# Patient Record
Sex: Female | Born: 1950 | Race: White | Hispanic: No | Marital: Married | Smoking: Never smoker
Health system: Southern US, Community
[De-identification: ages and names within clinical notes are randomized; demographics above are authoritative.]

## PROBLEM LIST (undated history)

## (undated) DIAGNOSIS — Z860100 Personal history of colon polyps, unspecified: Secondary | ICD-10-CM

## (undated) DIAGNOSIS — Z8709 Personal history of other diseases of the respiratory system: Secondary | ICD-10-CM

## (undated) DIAGNOSIS — Z8669 Personal history of other diseases of the nervous system and sense organs: Secondary | ICD-10-CM

## (undated) DIAGNOSIS — M549 Dorsalgia, unspecified: Secondary | ICD-10-CM

## (undated) DIAGNOSIS — R51 Headache: Secondary | ICD-10-CM

## (undated) DIAGNOSIS — E785 Hyperlipidemia, unspecified: Secondary | ICD-10-CM

## (undated) DIAGNOSIS — J302 Other seasonal allergic rhinitis: Secondary | ICD-10-CM

## (undated) DIAGNOSIS — Z87442 Personal history of urinary calculi: Secondary | ICD-10-CM

## (undated) DIAGNOSIS — R519 Headache, unspecified: Secondary | ICD-10-CM

## (undated) DIAGNOSIS — K5790 Diverticulosis of intestine, part unspecified, without perforation or abscess without bleeding: Secondary | ICD-10-CM

## (undated) DIAGNOSIS — J45909 Unspecified asthma, uncomplicated: Secondary | ICD-10-CM

## (undated) HISTORY — PX: NASAL SINUS SURGERY: SHX719

## (undated) HISTORY — PX: FOOT SURGERY: SHX648

## (undated) HISTORY — PX: COLONOSCOPY: SHX174

## (undated) HISTORY — PX: CYSTOSCOPY: SUR368

## (undated) HISTORY — PX: LITHOTRIPSY: SUR834

## (undated) HISTORY — PX: BACK SURGERY: SHX140

---

## 1983-10-25 HISTORY — PX: ABDOMINAL HYSTERECTOMY: SHX81

## 2005-11-14 ENCOUNTER — Ambulatory Visit: Payer: Self-pay | Admitting: Urology

## 2005-11-17 ENCOUNTER — Ambulatory Visit: Payer: Self-pay | Admitting: Urology

## 2006-03-24 ENCOUNTER — Ambulatory Visit: Payer: Self-pay

## 2007-05-15 ENCOUNTER — Ambulatory Visit: Payer: Self-pay

## 2007-05-18 ENCOUNTER — Ambulatory Visit: Payer: Self-pay

## 2007-10-25 HISTORY — PX: JOINT REPLACEMENT: SHX530

## 2008-07-01 ENCOUNTER — Ambulatory Visit: Payer: Self-pay | Admitting: Family Medicine

## 2008-08-05 ENCOUNTER — Ambulatory Visit: Payer: Self-pay | Admitting: Family Medicine

## 2008-08-12 ENCOUNTER — Ambulatory Visit: Payer: Self-pay | Admitting: General Practice

## 2008-08-26 ENCOUNTER — Inpatient Hospital Stay: Payer: Self-pay | Admitting: General Practice

## 2008-12-19 ENCOUNTER — Ambulatory Visit: Payer: Self-pay

## 2008-12-22 ENCOUNTER — Ambulatory Visit: Payer: Self-pay | Admitting: Pain Medicine

## 2009-01-12 ENCOUNTER — Ambulatory Visit: Payer: Self-pay | Admitting: Pain Medicine

## 2009-01-20 ENCOUNTER — Ambulatory Visit: Payer: Self-pay | Admitting: Pain Medicine

## 2009-02-04 ENCOUNTER — Ambulatory Visit: Payer: Self-pay | Admitting: Pain Medicine

## 2009-03-30 ENCOUNTER — Ambulatory Visit: Payer: Self-pay | Admitting: Pain Medicine

## 2009-03-31 ENCOUNTER — Ambulatory Visit: Payer: Self-pay | Admitting: Pain Medicine

## 2009-04-20 ENCOUNTER — Ambulatory Visit: Payer: Self-pay | Admitting: Pain Medicine

## 2009-04-21 ENCOUNTER — Ambulatory Visit: Payer: Self-pay | Admitting: Pain Medicine

## 2009-05-04 ENCOUNTER — Ambulatory Visit: Payer: Self-pay | Admitting: Physician Assistant

## 2009-09-14 ENCOUNTER — Ambulatory Visit: Payer: Self-pay | Admitting: Pain Medicine

## 2009-10-01 ENCOUNTER — Ambulatory Visit: Payer: Self-pay | Admitting: Physician Assistant

## 2009-10-02 DIAGNOSIS — M8580 Other specified disorders of bone density and structure, unspecified site: Secondary | ICD-10-CM | POA: Insufficient documentation

## 2009-10-02 DIAGNOSIS — IMO0002 Reserved for concepts with insufficient information to code with codable children: Secondary | ICD-10-CM | POA: Insufficient documentation

## 2009-10-06 ENCOUNTER — Ambulatory Visit: Payer: Self-pay | Admitting: Pain Medicine

## 2009-10-07 ENCOUNTER — Ambulatory Visit: Payer: Self-pay | Admitting: Family Medicine

## 2009-10-19 ENCOUNTER — Ambulatory Visit: Payer: Self-pay | Admitting: Family Medicine

## 2009-10-26 ENCOUNTER — Ambulatory Visit: Payer: Self-pay | Admitting: Pain Medicine

## 2010-01-11 DIAGNOSIS — M545 Low back pain, unspecified: Secondary | ICD-10-CM | POA: Insufficient documentation

## 2011-07-27 ENCOUNTER — Ambulatory Visit: Payer: Self-pay | Admitting: Family Medicine

## 2011-08-05 LAB — HM COLONOSCOPY

## 2012-09-03 LAB — HM HEPATITIS C SCREENING LAB: HM Hepatitis Screen: NEGATIVE

## 2012-09-18 ENCOUNTER — Ambulatory Visit: Payer: Self-pay | Admitting: Family Medicine

## 2013-03-14 ENCOUNTER — Ambulatory Visit: Payer: Self-pay | Admitting: Family Medicine

## 2013-04-18 ENCOUNTER — Ambulatory Visit: Payer: Self-pay | Admitting: Family Medicine

## 2013-06-05 ENCOUNTER — Ambulatory Visit: Payer: Self-pay | Admitting: Family Medicine

## 2013-06-15 ENCOUNTER — Emergency Department: Payer: Self-pay | Admitting: Emergency Medicine

## 2013-07-02 ENCOUNTER — Other Ambulatory Visit: Payer: Self-pay | Admitting: Neurosurgery

## 2013-07-09 ENCOUNTER — Encounter (HOSPITAL_COMMUNITY): Payer: Self-pay | Admitting: Pharmacy Technician

## 2013-07-10 ENCOUNTER — Encounter (HOSPITAL_COMMUNITY): Payer: Self-pay

## 2013-07-10 ENCOUNTER — Encounter (HOSPITAL_COMMUNITY)
Admission: RE | Admit: 2013-07-10 | Discharge: 2013-07-10 | Disposition: A | Discharge status: Home / Self Care | Payer: BC Managed Care – PPO | Source: Ambulatory Visit | Attending: Neurosurgery | Admitting: Neurosurgery

## 2013-07-10 DIAGNOSIS — Z01810 Encounter for preprocedural cardiovascular examination: Secondary | ICD-10-CM | POA: Insufficient documentation

## 2013-07-10 DIAGNOSIS — Z01818 Encounter for other preprocedural examination: Secondary | ICD-10-CM | POA: Insufficient documentation

## 2013-07-10 DIAGNOSIS — Z01812 Encounter for preprocedural laboratory examination: Secondary | ICD-10-CM | POA: Insufficient documentation

## 2013-07-10 HISTORY — DX: Diverticulosis of intestine, part unspecified, without perforation or abscess without bleeding: K57.90

## 2013-07-10 HISTORY — DX: Other seasonal allergic rhinitis: J30.2

## 2013-07-10 HISTORY — DX: Personal history of other diseases of the nervous system and sense organs: Z86.69

## 2013-07-10 HISTORY — DX: Hyperlipidemia, unspecified: E78.5

## 2013-07-10 HISTORY — DX: Dorsalgia, unspecified: M54.9

## 2013-07-10 HISTORY — DX: Personal history of colonic polyps: Z86.010

## 2013-07-10 HISTORY — DX: Unspecified asthma, uncomplicated: J45.909

## 2013-07-10 HISTORY — DX: Personal history of urinary calculi: Z87.442

## 2013-07-10 HISTORY — DX: Personal history of other diseases of the respiratory system: Z87.09

## 2013-07-10 HISTORY — DX: Personal history of colon polyps, unspecified: Z86.0100

## 2013-07-10 LAB — CBC
HCT: 40.1 % (ref 36.0–46.0)
Hemoglobin: 13.6 g/dL (ref 12.0–15.0)
MCH: 29.8 pg (ref 26.0–34.0)
MCHC: 33.9 g/dL (ref 30.0–36.0)
MCV: 87.9 fL (ref 78.0–100.0)
Platelets: 236 10*3/uL (ref 150–400)
RBC: 4.56 MIL/uL (ref 3.87–5.11)
RDW: 12.7 % (ref 11.5–15.5)
WBC: 6.4 10*3/uL (ref 4.0–10.5)

## 2013-07-10 LAB — SURGICAL PCR SCREEN
MRSA, PCR: NEGATIVE
Staphylococcus aureus: NEGATIVE

## 2013-07-10 LAB — TYPE AND SCREEN
ABO/RH(D): O POS
Antibody Screen: NEGATIVE

## 2013-07-10 LAB — BASIC METABOLIC PANEL
BUN: 13 mg/dL (ref 6–23)
CO2: 25 mEq/L (ref 19–32)
Calcium: 9.5 mg/dL (ref 8.4–10.5)
Chloride: 100 mEq/L (ref 96–112)
Creatinine, Ser: 0.88 mg/dL (ref 0.50–1.10)
GFR calc Af Amer: 81 mL/min — ABNORMAL LOW (ref >90)
GFR calc non Af Amer: 69 mL/min — ABNORMAL LOW (ref >90)
Glucose, Bld: 86 mg/dL (ref 70–99)
Potassium: 4.1 mEq/L (ref 3.5–5.1)
Sodium: 136 mEq/L (ref 135–145)

## 2013-07-10 LAB — ABO/RH: ABO/RH(D): O POS

## 2013-07-10 NOTE — Progress Notes (Signed)
Pt doesn't have a cardiologist  Denies ever having a heart cath/echo/stress test  Dr.Nancy Elease Hashimoto is Medical Md in [place]  3 CXR's have been done since May -to request report from Dr.Maloney  Denies EKG in past yr

## 2013-07-10 NOTE — Pre-Procedure Instructions (Signed)
LATARIA COURSER  07/10/2013   Your procedure is scheduled on:  Fri, Sept 19 @ 7:30 AM  Report to [facility] at 5:30 AM.  Call this number if you have problems the morning of surgery: [phone]   Remember:   Do not eat food or drink liquids after midnight.   Take these medicines the morning of surgery with A SIP OF WATER: Flonase(Fluticasone),Claritin(Loratadine),Dulera<Bring Your Inhaler With You>,and Pain Pill(if needed)               Stop taking your Ibuprofen.No Aspirin,Goody's,BC's,Aleve,Fish Oil,or any Herbal Medications   Do not wear jewelry, make-up or nail polish.  Do not wear lotions, powders, or perfumes. You may wear deodorant.  Do not shave 48 hours prior to surgery.   Do not bring valuables to the hospital.  [facility] is not responsible                   for any belongings or valuables.  Contacts, dentures or bridgework may not be worn into surgery.  Leave suitcase in the car. After surgery it may be brought to your room.  For patients admitted to the hospital, checkout time is 11:00 AM the day of  discharge.     Special Instructions: Shower using CHG 2 nights before surgery and the night before surgery.  If you shower the day of surgery use CHG.  Use special wash - you have one bottle of CHG for all showers.  You should use approximately 1/3 of the bottle for each shower.   Please read over the following fact sheets that you were given: Pain Booklet, Coughing and Deep Breathing, Blood Transfusion Information, MRSA Information and Surgical Site Infection Prevention

## 2013-07-11 MED ORDER — CEFAZOLIN SODIUM-DEXTROSE 2-3 GM-% IV SOLR
2.0000 g | INTRAVENOUS | Status: AC
  Administered 2013-07-12: 2 g via INTRAVENOUS
  Filled 2013-07-11: qty 50

## 2013-07-11 NOTE — Progress Notes (Signed)
Spoke with Dr Landis Gandy office [phone].  They state pt had CXR from August and they will send it now.

## 2013-07-12 ENCOUNTER — Inpatient Hospital Stay (HOSPITAL_COMMUNITY): Payer: BC Managed Care – PPO | Admitting: Anesthesiology

## 2013-07-12 ENCOUNTER — Encounter (HOSPITAL_COMMUNITY)
Admission: RE | Disposition: A | Discharge status: Home / Self Care | Payer: BC Managed Care – PPO | Source: Ambulatory Visit | Attending: Neurosurgery

## 2013-07-12 ENCOUNTER — Inpatient Hospital Stay (HOSPITAL_COMMUNITY)
Admission: RE | Admit: 2013-07-12 | Discharge: 2013-07-17 | DRG: 755 | Disposition: A | Discharge status: Home / Self Care | Payer: BC Managed Care – PPO | Source: Ambulatory Visit | Attending: Neurosurgery | Admitting: Neurosurgery

## 2013-07-12 ENCOUNTER — Encounter (HOSPITAL_COMMUNITY): Payer: Self-pay | Admitting: Anesthesiology

## 2013-07-12 ENCOUNTER — Encounter (HOSPITAL_COMMUNITY): Payer: Self-pay | Admitting: *Deleted

## 2013-07-12 ENCOUNTER — Inpatient Hospital Stay (HOSPITAL_COMMUNITY): Payer: BC Managed Care – PPO

## 2013-07-12 DIAGNOSIS — E7800 Pure hypercholesterolemia, unspecified: Secondary | ICD-10-CM | POA: Diagnosis present

## 2013-07-12 DIAGNOSIS — S32009A Unspecified fracture of unspecified lumbar vertebra, initial encounter for closed fracture: Principal | ICD-10-CM | POA: Diagnosis present

## 2013-07-12 DIAGNOSIS — S22001G Stable burst fracture of unspecified thoracic vertebra, subsequent encounter for fracture with delayed healing: Secondary | ICD-10-CM

## 2013-07-12 DIAGNOSIS — J4489 Other specified chronic obstructive pulmonary disease: Secondary | ICD-10-CM | POA: Diagnosis present

## 2013-07-12 DIAGNOSIS — J449 Chronic obstructive pulmonary disease, unspecified: Secondary | ICD-10-CM | POA: Diagnosis present

## 2013-07-12 DIAGNOSIS — W010XXA Fall on same level from slipping, tripping and stumbling without subsequent striking against object, initial encounter: Secondary | ICD-10-CM | POA: Diagnosis present

## 2013-07-12 DIAGNOSIS — Z01812 Encounter for preprocedural laboratory examination: Secondary | ICD-10-CM | POA: Diagnosis not present

## 2013-07-12 DIAGNOSIS — Z23 Encounter for immunization: Secondary | ICD-10-CM | POA: Diagnosis not present

## 2013-07-12 DIAGNOSIS — Z96659 Presence of unspecified artificial knee joint: Secondary | ICD-10-CM | POA: Diagnosis not present

## 2013-07-12 DIAGNOSIS — Z01810 Encounter for preprocedural cardiovascular examination: Secondary | ICD-10-CM

## 2013-07-12 DIAGNOSIS — S22080A Wedge compression fracture of T11-T12 vertebra, initial encounter for closed fracture: Secondary | ICD-10-CM

## 2013-07-12 DIAGNOSIS — M545 Low back pain, unspecified: Secondary | ICD-10-CM | POA: Diagnosis present

## 2013-07-12 HISTORY — PX: LAMINECTOMY WITH POSTERIOR LATERAL ARTHRODESIS LEVEL 4: SHX6338

## 2013-07-12 SURGERY — LAMINECTOMY WITH POSTERIOR LATERAL ARTHRODESIS LEVEL 4
Anesthesia: General | Wound class: Clean

## 2013-07-12 MED ORDER — MENTHOL 3 MG MT LOZG: 1.0000 | LOZENGE | OROMUCOSAL | Status: DC | PRN

## 2013-07-12 MED ORDER — HYDROMORPHONE HCL PF 1 MG/ML IJ SOLN
INTRAMUSCULAR | Status: AC
  Administered 2013-07-12: 0.5 mg via INTRAVENOUS
  Filled 2013-07-12: qty 1

## 2013-07-12 MED ORDER — HYDROCODONE-ACETAMINOPHEN 5-325 MG PO TABS
1.0000 | ORAL_TABLET | ORAL | Status: DC | PRN
  Administered 2013-07-15: 2 via ORAL
  Administered 2013-07-17: 1 via ORAL
  Filled 2013-07-12: qty 2
  Filled 2013-07-12: qty 1

## 2013-07-12 MED ORDER — ACETAMINOPHEN 650 MG RE SUPP: 650.0000 mg | RECTAL | Status: DC | PRN

## 2013-07-12 MED ORDER — THROMBIN 20000 UNITS EX SOLR
CUTANEOUS | Status: DC | PRN
  Administered 2013-07-12: 09:00:00 via TOPICAL

## 2013-07-12 MED ORDER — FLUTICASONE PROPIONATE 50 MCG/ACT NA SUSP
2.0000 | Freq: Every day | NASAL | Status: DC
  Administered 2013-07-12 – 2013-07-16 (×5): 2 via NASAL
  Filled 2013-07-12: qty 16

## 2013-07-12 MED ORDER — PROMETHAZINE HCL 25 MG/ML IJ SOLN: 6.2500 mg | INTRAMUSCULAR | Status: DC | PRN

## 2013-07-12 MED ORDER — ROCURONIUM BROMIDE 100 MG/10ML IV SOLN
INTRAVENOUS | Status: DC | PRN
  Administered 2013-07-12: 50 mg via INTRAVENOUS

## 2013-07-12 MED ORDER — LACTATED RINGERS IV SOLN
INTRAVENOUS | Status: DC | PRN
  Administered 2013-07-12 (×3): via INTRAVENOUS

## 2013-07-12 MED ORDER — ONDANSETRON HCL 4 MG/2ML IJ SOLN
INTRAMUSCULAR | Status: DC | PRN
  Administered 2013-07-12: 4 mg via INTRAVENOUS

## 2013-07-12 MED ORDER — NEOSTIGMINE METHYLSULFATE 1 MG/ML IJ SOLN
INTRAMUSCULAR | Status: DC | PRN
  Administered 2013-07-12: 5 mg via INTRAVENOUS

## 2013-07-12 MED ORDER — SIMVASTATIN 10 MG PO TABS
10.0000 mg | ORAL_TABLET | Freq: Every day | ORAL | Status: DC
  Administered 2013-07-12 – 2013-07-16 (×5): 10 mg via ORAL
  Filled 2013-07-12 (×6): qty 1

## 2013-07-12 MED ORDER — SODIUM CHLORIDE 0.9 % IJ SOLN
3.0000 mL | Freq: Two times a day (BID) | INTRAMUSCULAR | Status: DC
  Administered 2013-07-12 – 2013-07-15 (×7): 3 mL via INTRAVENOUS

## 2013-07-12 MED ORDER — SODIUM CHLORIDE 0.9 % IV SOLN: 250.0000 mL | INTRAVENOUS | Status: DC

## 2013-07-12 MED ORDER — GLYCOPYRROLATE 0.2 MG/ML IJ SOLN
INTRAMUSCULAR | Status: DC | PRN
  Administered 2013-07-12: 0.6 mg via INTRAVENOUS

## 2013-07-12 MED ORDER — DIAZEPAM 5 MG PO TABS
ORAL_TABLET | ORAL | Status: AC
  Administered 2013-07-12: 5 mg via ORAL
  Filled 2013-07-12: qty 1

## 2013-07-12 MED ORDER — OXYCODONE-ACETAMINOPHEN 5-325 MG PO TABS
1.0000 | ORAL_TABLET | ORAL | Status: DC | PRN
  Administered 2013-07-12 – 2013-07-16 (×18): 2 via ORAL
  Administered 2013-07-16: 1 via ORAL
  Filled 2013-07-12: qty 2
  Filled 2013-07-12: qty 1
  Filled 2013-07-12 (×18): qty 2

## 2013-07-12 MED ORDER — ACETAMINOPHEN 325 MG PO TABS: 650.0000 mg | ORAL_TABLET | ORAL | Status: DC | PRN

## 2013-07-12 MED ORDER — CEFAZOLIN SODIUM 1-5 GM-% IV SOLN
1.0000 g | Freq: Three times a day (TID) | INTRAVENOUS | Status: AC
  Administered 2013-07-12 (×2): 1 g via INTRAVENOUS
  Filled 2013-07-12: qty 50

## 2013-07-12 MED ORDER — DEXTROSE 5 % IV SOLN
INTRAVENOUS | Status: DC | PRN
  Administered 2013-07-12: 09:00:00 via INTRAVENOUS

## 2013-07-12 MED ORDER — LIDOCAINE HCL (CARDIAC) 20 MG/ML IV SOLN
INTRAVENOUS | Status: DC | PRN
  Administered 2013-07-12: 50 mg via INTRAVENOUS
  Administered 2013-07-12: 80 mg via INTRAVENOUS

## 2013-07-12 MED ORDER — HYDROMORPHONE HCL PF 1 MG/ML IJ SOLN
0.5000 mg | INTRAMUSCULAR | Status: DC | PRN
  Administered 2013-07-12 – 2013-07-15 (×9): 1 mg via INTRAVENOUS
  Filled 2013-07-12 (×9): qty 1

## 2013-07-12 MED ORDER — CEFAZOLIN SODIUM 1-5 GM-% IV SOLN
INTRAVENOUS | Status: AC
  Administered 2013-07-12: 1 g via INTRAVENOUS
  Filled 2013-07-12: qty 50

## 2013-07-12 MED ORDER — OXYCODONE HCL 5 MG PO TABS: 5.0000 mg | ORAL_TABLET | Freq: Once | ORAL | Status: DC | PRN

## 2013-07-12 MED ORDER — SENNA 8.6 MG PO TABS
1.0000 | ORAL_TABLET | Freq: Two times a day (BID) | ORAL | Status: DC
  Administered 2013-07-12 – 2013-07-16 (×8): 8.6 mg via ORAL
  Filled 2013-07-12 (×11): qty 1

## 2013-07-12 MED ORDER — MONTELUKAST SODIUM 10 MG PO TABS
10.0000 mg | ORAL_TABLET | Freq: Every day | ORAL | Status: DC
  Administered 2013-07-12 – 2013-07-16 (×5): 10 mg via ORAL
  Filled 2013-07-12 (×6): qty 1

## 2013-07-12 MED ORDER — FENTANYL CITRATE 0.05 MG/ML IJ SOLN
INTRAMUSCULAR | Status: DC | PRN
  Administered 2013-07-12: 100 ug via INTRAVENOUS
  Administered 2013-07-12: 50 ug via INTRAVENOUS
  Administered 2013-07-12: 100 ug via INTRAVENOUS
  Administered 2013-07-12 (×2): 50 ug via INTRAVENOUS
  Administered 2013-07-12: 100 ug via INTRAVENOUS
  Administered 2013-07-12 (×2): 50 ug via INTRAVENOUS

## 2013-07-12 MED ORDER — DIAZEPAM 5 MG PO TABS
5.0000 mg | ORAL_TABLET | Freq: Four times a day (QID) | ORAL | Status: DC | PRN
  Administered 2013-07-12 – 2013-07-17 (×9): 5 mg via ORAL
  Filled 2013-07-12 (×8): qty 1

## 2013-07-12 MED ORDER — PHENOL 1.4 % MT LIQD: 1.0000 | OROMUCOSAL | Status: DC | PRN

## 2013-07-12 MED ORDER — HYDROMORPHONE HCL PF 1 MG/ML IJ SOLN
0.2500 mg | INTRAMUSCULAR | Status: DC | PRN
  Administered 2013-07-12 (×4): 0.5 mg via INTRAVENOUS

## 2013-07-12 MED ORDER — SODIUM CHLORIDE 0.9 % IJ SOLN
3.0000 mL | INTRAMUSCULAR | Status: DC | PRN
  Administered 2013-07-16: 3 mL via INTRAVENOUS

## 2013-07-12 MED ORDER — DEXAMETHASONE SODIUM PHOSPHATE 4 MG/ML IJ SOLN
INTRAMUSCULAR | Status: DC | PRN
  Administered 2013-07-12: 8 mg via INTRAVENOUS

## 2013-07-12 MED ORDER — 0.9 % SODIUM CHLORIDE (POUR BTL) OPTIME
TOPICAL | Status: DC | PRN
  Administered 2013-07-12: 1000 mL

## 2013-07-12 MED ORDER — ARTIFICIAL TEARS OP OINT
TOPICAL_OINTMENT | OPHTHALMIC | Status: DC | PRN
  Administered 2013-07-12: 1 via OPHTHALMIC

## 2013-07-12 MED ORDER — LORATADINE 10 MG PO TABS
10.0000 mg | ORAL_TABLET | Freq: Every day | ORAL | Status: DC
  Administered 2013-07-12 – 2013-07-16 (×5): 10 mg via ORAL
  Filled 2013-07-12 (×6): qty 1

## 2013-07-12 MED ORDER — OXYCODONE HCL 5 MG/5ML PO SOLN: 5.0000 mg | Freq: Once | ORAL | Status: DC | PRN

## 2013-07-12 MED ORDER — LIDOCAINE-EPINEPHRINE 0.5 %-1:200000 IJ SOLN
INTRAMUSCULAR | Status: DC | PRN
  Administered 2013-07-12: 20 mL

## 2013-07-12 MED ORDER — ONDANSETRON HCL 4 MG/2ML IJ SOLN
4.0000 mg | INTRAMUSCULAR | Status: DC | PRN
  Administered 2013-07-13: 4 mg via INTRAVENOUS
  Filled 2013-07-12: qty 2

## 2013-07-12 MED ORDER — VECURONIUM BROMIDE 10 MG IV SOLR
INTRAVENOUS | Status: DC | PRN
  Administered 2013-07-12: 3 mg via INTRAVENOUS
  Administered 2013-07-12: 2 mg via INTRAVENOUS

## 2013-07-12 MED ORDER — POLYETHYLENE GLYCOL 3350 17 G PO PACK
17.0000 g | PACK | Freq: Every day | ORAL | Status: DC | PRN
  Filled 2013-07-12: qty 1

## 2013-07-12 MED ORDER — PROPOFOL 10 MG/ML IV BOLUS
INTRAVENOUS | Status: DC | PRN
  Administered 2013-07-12: 40 mg via INTRAVENOUS
  Administered 2013-07-12: 160 mg via INTRAVENOUS

## 2013-07-12 MED ORDER — MIDAZOLAM HCL 5 MG/5ML IJ SOLN
INTRAMUSCULAR | Status: DC | PRN
  Administered 2013-07-12: 2 mg via INTRAVENOUS

## 2013-07-12 MED ORDER — MOMETASONE FURO-FORMOTEROL FUM 200-5 MCG/ACT IN AERO
2.0000 | INHALATION_SPRAY | Freq: Two times a day (BID) | RESPIRATORY_TRACT | Status: DC
  Administered 2013-07-12 – 2013-07-17 (×10): 2 via RESPIRATORY_TRACT
  Filled 2013-07-12: qty 8.8

## 2013-07-12 MED ORDER — POTASSIUM CHLORIDE IN NACL 20-0.9 MEQ/L-% IV SOLN
INTRAVENOUS | Status: DC
  Administered 2013-07-12: 18:00:00 via INTRAVENOUS
  Filled 2013-07-12 (×11): qty 1000

## 2013-07-12 SURGICAL SUPPLY — 64 items
BAG DECANTER FOR FLEXI CONT (MISCELLANEOUS) ×2 IMPLANT
BENZOIN TINCTURE PRP APPL 2/3 (GAUZE/BANDAGES/DRESSINGS) IMPLANT
BLADE SURG ROTATE 9660 (MISCELLANEOUS) IMPLANT
BUR MATCHSTICK NEURO 3.0 LAGG (BURR) ×2 IMPLANT
CANISTER SUCTION 2500CC (MISCELLANEOUS) ×2 IMPLANT
CLOTH BEACON ORANGE TIMEOUT ST (SAFETY) ×2 IMPLANT
CONNECTOR SFX SIZE A4 (Orthopedic Implant) ×2 IMPLANT
CONT SPEC 4OZ CLIKSEAL STRL BL (MISCELLANEOUS) ×2 IMPLANT
COVER BACK TABLE 24X17X13 BIG (DRAPES) IMPLANT
DECANTER SPIKE VIAL GLASS SM (MISCELLANEOUS) ×2 IMPLANT
DERMABOND ADVANCED (GAUZE/BANDAGES/DRESSINGS) ×2
DERMABOND ADVANCED .7 DNX12 (GAUZE/BANDAGES/DRESSINGS) ×2 IMPLANT
DRAPE C-ARM 42X72 X-RAY (DRAPES) ×4 IMPLANT
DRAPE LAPAROTOMY 100X72X124 (DRAPES) ×2 IMPLANT
DRAPE POUCH INSTRU U-SHP 10X18 (DRAPES) ×2 IMPLANT
DRAPE SURG 17X23 STRL (DRAPES) ×2 IMPLANT
DRESSING TELFA 8X3 (GAUZE/BANDAGES/DRESSINGS) IMPLANT
DRSG OPSITE POSTOP 4X10 (GAUZE/BANDAGES/DRESSINGS) ×2 IMPLANT
DURAPREP 26ML APPLICATOR (WOUND CARE) ×2 IMPLANT
ELECT REM PT RETURN 9FT ADLT (ELECTROSURGICAL) ×2
ELECTRODE REM PT RTRN 9FT ADLT (ELECTROSURGICAL) ×1 IMPLANT
GAUZE SPONGE 4X4 16PLY XRAY LF (GAUZE/BANDAGES/DRESSINGS) IMPLANT
GLOVE BIO SURGEON STRL SZ8 (GLOVE) ×2 IMPLANT
GLOVE BIOGEL PI IND STRL 8 (GLOVE) ×3 IMPLANT
GLOVE BIOGEL PI INDICATOR 8 (GLOVE) ×3
GLOVE ECLIPSE 6.5 STRL STRAW (GLOVE) ×6 IMPLANT
GLOVE EXAM NITRILE LRG STRL (GLOVE) IMPLANT
GLOVE EXAM NITRILE MD LF STRL (GLOVE) IMPLANT
GLOVE EXAM NITRILE XL STR (GLOVE) IMPLANT
GLOVE EXAM NITRILE XS STR PU (GLOVE) IMPLANT
GLOVE SURG SS PI 8.0 STRL IVOR (GLOVE) ×6 IMPLANT
GOWN BRE IMP SLV AUR LG STRL (GOWN DISPOSABLE) ×4 IMPLANT
GOWN BRE IMP SLV AUR XL STRL (GOWN DISPOSABLE) ×4 IMPLANT
GOWN STRL REIN 2XL LVL4 (GOWN DISPOSABLE) IMPLANT
KIT BASIN OR (CUSTOM PROCEDURE TRAY) ×2 IMPLANT
KIT POSITION SURG JACKSON T1 (MISCELLANEOUS) ×2 IMPLANT
KIT ROOM TURNOVER OR (KITS) ×2 IMPLANT
MARKER SKIN DUAL TIP RULER LAB (MISCELLANEOUS) ×2 IMPLANT
NEEDLE HYPO 25X1 1.5 SAFETY (NEEDLE) ×2 IMPLANT
NEEDLE SPNL 18GX3.5 QUINCKE PK (NEEDLE) IMPLANT
NS IRRIG 1000ML POUR BTL (IV SOLUTION) ×2 IMPLANT
PACK LAMINECTOMY NEURO (CUSTOM PROCEDURE TRAY) ×2 IMPLANT
PAD ARMBOARD 7.5X6 YLW CONV (MISCELLANEOUS) ×6 IMPLANT
ROD 200MM (Rod) ×2 IMPLANT
ROD SPNL 200X5.5XNS LF TI (Rod) ×2 IMPLANT
SCREW LOCK (Screw) ×8 IMPLANT
SCREW LOCK 100X5.5X OPN (Screw) ×8 IMPLANT
SCREW POLY 45X6.5 (Screw) ×6 IMPLANT
SCREW POLY 5.5X45MM (Screw) ×4 IMPLANT
SCREW POLY 6.5X45MM (Screw) ×6 IMPLANT
SHEET CONFORM 45LX20WX5H (Bone Implant) ×8 IMPLANT
SPONGE GAUZE 4X4 12PLY (GAUZE/BANDAGES/DRESSINGS) IMPLANT
SPONGE LAP 4X18 X RAY DECT (DISPOSABLE) IMPLANT
SPONGE SURGIFOAM ABS GEL 100 (HEMOSTASIS) ×2 IMPLANT
STRIP CLOSURE SKIN 1/2X4 (GAUZE/BANDAGES/DRESSINGS) IMPLANT
SUT PROLENE 6 0 BV (SUTURE) IMPLANT
SUT VIC AB 0 CT1 18XCR BRD8 (SUTURE) ×1 IMPLANT
SUT VIC AB 0 CT1 8-18 (SUTURE) ×1
SUT VIC AB 2-0 CT1 18 (SUTURE) ×2 IMPLANT
SUT VIC AB 3-0 SH 8-18 (SUTURE) ×4 IMPLANT
SYR 20ML ECCENTRIC (SYRINGE) ×2 IMPLANT
TOWEL OR 17X24 6PK STRL BLUE (TOWEL DISPOSABLE) ×2 IMPLANT
TOWEL OR 17X26 10 PK STRL BLUE (TOWEL DISPOSABLE) ×2 IMPLANT
WATER STERILE IRR 1000ML POUR (IV SOLUTION) ×2 IMPLANT

## 2013-07-12 NOTE — Transfer of Care (Signed)
Immediate Anesthesia Transfer of Care Note  Patient: Monica Delacruz  Procedure(s) Performed: Procedure(s) with comments: Thoracic Ten Through Lumbar Three posterior lateral fusion with instrumentation (N/A) - Thoracic Ten Through Lumbar Three posterior lateral fusion with instrumentation  Patient Location: PACU  Anesthesia Type:General  Level of Consciousness: oriented, sedated, patient cooperative and responds to stimulation  Airway & Oxygen Therapy: Patient Spontanous Breathing and Patient connected to nasal cannula oxygen  Post-op Assessment: Report given to PACU RN, Post -op Vital signs reviewed and stable, Patient moving all extremities and Patient moving all extremities X 4  Post vital signs: Reviewed and stable  Complications: No apparent anesthesia complications

## 2013-07-12 NOTE — Progress Notes (Signed)
Utilization Review Completed.Monica Delacruz T9/19/2014

## 2013-07-12 NOTE — Anesthesia Preprocedure Evaluation (Signed)
Anesthesia Evaluation  Patient identified by MRN, date of birth, ID band Patient awake    Reviewed: Allergy & Precautions, H&P , NPO status , Patient's Chart, lab work & pertinent test results  Airway Mallampati: I  Neck ROM: Full    Dental   Pulmonary COPD COPD inhaler,  breath sounds clear to auscultation        Cardiovascular negative cardio ROS  Rhythm:Regular Rate:Normal     Neuro/Psych    GI/Hepatic negative GI ROS, Neg liver ROS,   Endo/Other  negative endocrine ROS  Renal/GU negative Renal ROS     Musculoskeletal negative musculoskeletal ROS (+)   Abdominal   Peds  Hematology negative hematology ROS (+)   Anesthesia Other Findings   Reproductive/Obstetrics                           Anesthesia Physical Anesthesia Plan  ASA: II  Anesthesia Plan: General   Post-op Pain Management:    Induction: Intravenous  Airway Management Planned: Oral ETT  Additional Equipment:   Intra-op Plan:   Post-operative Plan: Extubation in OR  Informed Consent: I have reviewed the patients History and Physical, chart, labs and discussed the procedure including the risks, benefits and alternatives for the proposed anesthesia with the patient or authorized representative who has indicated his/her understanding and acceptance.   Dental advisory given  Plan Discussed with: CRNA and Surgeon  Anesthesia Plan Comments:         Anesthesia Quick Evaluation
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
## 2013-07-12 NOTE — Preoperative (Signed)
Beta Blockers   Reason not to administer Beta Blockers:Not Applicable 

## 2013-07-12 NOTE — Anesthesia Procedure Notes (Addendum)
Procedure Name: Intubation Date/Time: 07/12/2013 8:22 AM Performed by: Wray Kearns A Pre-anesthesia Checklist: Patient identified, Timeout performed, Emergency Drugs available, Suction available and Patient being monitored Patient Re-evaluated:Patient Re-evaluated prior to inductionOxygen Delivery Method: Circle system utilized Preoxygenation: Pre-oxygenation with 100% oxygen Intubation Type: IV induction and Cricoid Pressure applied Ventilation: Mask ventilation without difficulty and Oral airway inserted - appropriate to patient size Laryngoscope Size: Mac and 3 Grade View: Grade I Tube type: Oral Tube size: 7.5 mm Number of attempts: 1 Airway Equipment and Method: Stylet Placement Confirmation: ETT inserted through vocal cords under direct vision,  breath sounds checked- equal and bilateral and positive ETCO2 Secured at: 22 cm Dental Injury: Teeth and Oropharynx as per pre-operative assessment     Performed by: Elisabeth Most

## 2013-07-12 NOTE — H&P (Signed)
BP 140/85  Pulse 86  Temp(Src) 97 F (36.1 C) (Oral)  Resp 18  SpO2 98%    Ms. Monica Delacruz comes in today for evaluation of pain that she is having in the lower back.  She fell and started to experience very severe pain just in the back without radiation to the lower extremities.  She has had plain x-rays of the lumbar spine which showed new compression fractures at T12 and L1 without any retropulsion.  She was seen in the emergency room and called stating that she really needed to be seen in the office secondary to the amount of pain that she was in.  She also reported some left hip pain when she was at [facility].  She has had no bowel or bladder dysfunction.  She was given a brace in the emergency room.  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   REVIEW OF SYSTEMS:                                    Positive for sinus problems, asthma, hypercholesterolemia, urinary tract infections, kidney stones, and back pain.  She denies any constitutional, eye, gastrointestinal, skin, neurological, psychiatric, endocrine, hematologic, and allergic problems.  On her pain chart, she only circled the region of the lumbar spine.  She states that nothing has helped to relieve the pain since she fell.   PAST MEDICAL HISTORY:            Current Medical Conditions:  Significant for asthma.            Prior Operations:  She has had a hysterectomy and a complete knee replacement.            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  Current medications are Advair Diskus, indomethacin, lovastatin, methocarbamol, and montelukast.  I refilled her Percocet.   FAMILY HISTORY:                                            Mother and father both deceased.   SOCIAL HISTORY:                                            She does not smoke.  She does drink socially.  She does not have a  history of substance abuse.   PHYSICAL EXAMINATION:                                She is 65 inches in height, she weighs 169 pounds, BMI is 28.12, blood pressure is 148/82, pulse is 89, and respiratory rate is 20.  On examination, she is in moderate distress, appears quite anxious.  Pulses are good at the wrists and feet bilaterally.  No cervical masses or bruits.  Lung fields clear.  Gait was normal.  She was steady.  5/5 strength in both upper and lower extremities throughout.  She had normal mental status.  Pupils are equal, round, and reactive to light.  Full extraocular movements.  Full  visual fields.  Hearing intact to voice bilaterally.  Uvula elevates in the midline.  Shoulder shrug is normal.  Tongue protrudes in the midline.  She has 2+ reflexes throughout.  She had no Hoffmann sign.  No clonus.  Toes are downgoing to plantar stimulation.   IMAGING STUDIES:                                          Plain x-rays show compression fractures at L1 and at T12.  Alignment overall is maintained, maybe some exaggeration of the kyphosis at T11, but not really.  She also had x-rays of the hips bilaterally showing some mild degeneration.   Monica Delacruz returns with an MRI of the lumbar and thoracic region and plain x-rays.  Unfortunately, it appears that the compression has increased anteriorly at T12.  The L1 fracture does not look any different than it did from 06/15/2013, when she took the fall.  There is unfortunate bone, which has been placed into the canal.  Therefore, a kyphoplasty is not possible.  She is still in such pain that we did discuss and have actually booked her for a T10-L3 fusion.  There is no need for decompression, just screws and rods to displace the force around the two fractured bones.  Quite honestly if and when the bones have healed, we could remove the rods and the screws because I am not going to fuse for those levels.  I am just going to place the screws and fuse across the fractured  levels.      IMPRESSION/PLAN:                                         We spent about 45 minutes going over this face-to-face in the office.  There is also an inadvertent prescription for hydrocodone that I printed that has been shredded and I deleted it from her medication list on [employer].  Risks and benefits of the operation, bleeding, infection, no relief, fusion failure, no decrease in pain, damage to the spinal cord, weakness in one or both legs, bowel or bladder dysfunction, hardware failure were discussed.  She was also given a detailed instruction sheet, which goes over all these things.  She would still like to proceed.

## 2013-07-12 NOTE — OR Nursing (Signed)
A trial bipolar instrument was used during procedure. The machine is called the Aquamantys. The machine was manufactured by [employer], model [id number], serial number [id number]. The product rep was Sharion Dove ([employer], cell phone #[phone]). The bipolar instrument was the Aquamantys 2.3 Biploar Sealer (Lot#[id number], exp. date 04/22/18). No charge was made to the patient for use of this equipment.

## 2013-07-12 NOTE — Anesthesia Postprocedure Evaluation (Signed)
  Anesthesia Post-op Note  Patient: Monica Delacruz  Procedure(s) Performed: Procedure(s) with comments: Thoracic Ten Through Lumbar Three posterior lateral fusion with instrumentation (N/A) - Thoracic Ten Through Lumbar Three posterior lateral fusion with instrumentation  Patient Location: PACU  Anesthesia Type:General  Level of Consciousness: awake and sedated  Airway and Oxygen Therapy: Patient Spontanous Breathing  Post-op Pain: mild  Post-op Assessment: Post-op Vital signs reviewed  Post-op Vital Signs: stable  Complications: No apparent anesthesia complications

## 2013-07-13 MED ORDER — INFLUENZA VAC SPLIT QUAD 0.5 ML IM SUSP
0.5000 mL | INTRAMUSCULAR | Status: AC
  Administered 2013-07-14: 0.5 mL via INTRAMUSCULAR
  Filled 2013-07-13: qty 0.5
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
## 2013-07-13 NOTE — Progress Notes (Signed)
Filed Vitals:   07/12/13 2155 07/13/13 0223 07/13/13 0500 07/13/13 0856  BP: 112/65 106/64 97/57   Pulse: 92 83 88   Temp: 97.4 F (36.3 C) 97.8 F (36.6 C) 98.1 F (36.7 C)   TempSrc: Oral Oral Oral   Resp: 16 17 18    Height:      Weight:      SpO2: 97% 95% 96% 96%    CBC  Recent Labs  07/10/13 1520  WBC 6.4  HGB 13.6  HCT 40.1  PLT 236   BMET  Recent Labs  07/10/13 1520  NA 136  K 4.1  CL 100  CO2 25  GLUCOSE 86  BUN 13  CREATININE 0.88  CALCIUM 9.5    Patient with moderate pain, receiving both parenteral and by mouth analgesics. Has ambulated once in the hallway. PT and OT pending.  Plan: Encouraged increasing ambulation, and transition to solely by mouth analgesics. Continue to progress through postoperative recovery.  Hewitt Shorts, MD 07/13/2013, 10:34 AM

## 2013-07-13 NOTE — Progress Notes (Signed)
Patient ambulated half hallway with RN. Tolerated fairly well with brace and walker. Nauseated after the ambulation. Foley d/cd.

## 2013-07-14 MED ORDER — INFLUENZA VAC SPLIT QUAD 0.5 ML IM SUSP
0.5000 mL | INTRAMUSCULAR | Status: AC
  Filled 2013-07-14: qty 0.5

## 2013-07-14 MED ORDER — MAGNESIUM HYDROXIDE 400 MG/5ML PO SUSP
30.0000 mL | Freq: Every day | ORAL | Status: DC | PRN
  Administered 2013-07-14 – 2013-07-15 (×2): 30 mL via ORAL
  Filled 2013-07-14: qty 30

## 2013-07-14 MED ORDER — ALUM & MAG HYDROXIDE-SIMETH 200-200-20 MG/5ML PO SUSP: 30.0000 mL | Freq: Four times a day (QID) | ORAL | Status: DC | PRN
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
## 2013-07-14 NOTE — Progress Notes (Signed)
Patient ID: Monica Delacruz, female   DOB: 11/26/1950, 62 y.o.   MRN: [id number] Subjective: Patient reports appropriate postoperative back soreness. Some radiation to left hip and leg. No weakness or numbness or tingling  Objective: Vital signs in last 24 hours: Temp:  [98.2 F (36.8 C)-99.4 F (37.4 C)] 99.2 F (37.3 C) (09/21 1000) Pulse Rate:  [93-110] 93 (09/21 1000) Resp:  [18-20] 18 (09/21 1000) BP: (103-121)/(43-70) 115/56 mmHg (09/21 1000) SpO2:  [93 %-98 %] 98 % (09/21 1000)  Intake/Output from previous day:   Intake/Output this shift: Total I/O In: 240 [P.O.:240] Out: -   Neurologic: Grossly normal  Lab Results: Lab Results  Component Value Date   WBC 6.4 07/10/2013   HGB 13.6 07/10/2013   HCT 40.1 07/10/2013   MCV 87.9 07/10/2013   PLT 236 07/10/2013   No results found for this basename: INR, PROTIME   BMET Lab Results  Component Value Date   NA 136 07/10/2013   K 4.1 07/10/2013   CL 100 07/10/2013   CO2 25 07/10/2013   GLUCOSE 86 07/10/2013   BUN 13 07/10/2013   CREATININE 0.88 07/10/2013   CALCIUM 9.5 07/10/2013    Studies/Results: No results found.  Assessment/Plan: Making the expected recovery. Continue pain control.   LOS: 2 days    Shermaine Brigham S 07/14/2013, 11:50 AM

## 2013-07-14 NOTE — Plan of Care (Signed)
Problem: Consults Goal: Diagnosis - Spinal Surgery Outcome: Completed/Met Date Met:  07/14/13 Thoraco/Lumbar Spine Fusion
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
## 2013-07-15 ENCOUNTER — Encounter (HOSPITAL_COMMUNITY): Payer: Self-pay | Admitting: Neurosurgery

## 2013-07-15 MED FILL — Heparin Sodium (Porcine) Inj 1000 Unit/ML: INTRAMUSCULAR | Qty: 30 | Status: AC

## 2013-07-15 MED FILL — Sodium Chloride IV Soln 0.9%: INTRAVENOUS | Qty: 1000 | Status: AC
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
## 2013-07-15 NOTE — Progress Notes (Signed)
Patient ambulated down the hallway with RN. Tolerated very well.

## 2013-07-15 NOTE — Progress Notes (Signed)
Occupational Therapy Treatment Patient Details Name: Monica Delacruz MRN: [id number] DOB: 06/04/1951 Today's Date: 07/15/2013 Time: 3664-4034 OT Time Calculation (min): 36 min  OT Assessment / Plan / Recommendation       OT comments  Feeling well today, states she got a good night's rest.  Able to complete LB dressing with A/E mod I., (husband purchased a/e kit for her for home) sim. Shower stall transfer min guard.    Follow Up Recommendations  Home health OT    Barriers to Discharge       Equipment Recommendations  3 in 1 bedside comode        Frequency Min 2X/week   Progress towards OT Goals Progress towards OT goals: Progressing toward goals  Plan Discharge plan remains appropriate    Precautions / Restrictions Precautions Precautions: Back Required Braces or Orthoses: Spinal Brace Spinal Brace: Lumbar corset;Applied in sitting position   Pertinent Vitals/Pain 4/10    ADL  Upper Body Dressing: Performed;Set up Where Assessed - Upper Body Dressing: Unsupported sitting Lower Body Dressing: Performed;Modified independent Where Assessed - Lower Body Dressing: Supported sitting Tub/Shower Transfer: Min guard;Simulated Tub/Shower Transfer Method: Location manager: Walk in shower;Grab bars;Shower seat without back Equipment Used: Other (comment) (a/e kit) Transfers/Ambulation Related to ADLs: s for all transfers ADL Comments: pt. in recliner upon arrival.  able to use a/e with mod I, husband purchased kit for her over the weekend.  sim. shower stall transfer with min guard. pt. has grab bars and shower chair at home      OT Goals(current goals can now be found in the care plan section)    Visit Information  Last OT Received On: 07/15/13                 Cognition  Cognition Arousal/Alertness: Awake/alert Behavior During Therapy: [facility] for tasks assessed/performed Overall Cognitive Status: Within Functional Limits for tasks  assessed    Mobility  Transfers Transfers: Sit to Stand;Stand to Sit Sit to Stand: 5: Supervision;With armrests;From chair/3-in-1 Stand to Sit: 5: Supervision;With armrests;To chair/3-in-1               End of Session OT - End of Session Equipment Utilized During Treatment: Back brace Activity Tolerance: Patient tolerated treatment well Patient left: in chair;with call bell/phone within reach       Robet Leu, COTA/L 07/15/2013, 7:23 AM

## 2013-07-16 MED ORDER — OXYCODONE-ACETAMINOPHEN 5-325 MG PO TABS: 1.0000 | ORAL_TABLET | Freq: Four times a day (QID) | ORAL | Status: DC | PRN

## 2013-07-16 MED ORDER — CYCLOBENZAPRINE HCL 10 MG PO TABS: 10.0000 mg | ORAL_TABLET | Freq: Three times a day (TID) | ORAL | Status: DC | PRN

## 2013-07-16 NOTE — Progress Notes (Signed)
Patient ID: Monica Delacruz, female   DOB: [birth year], 62 y.o.   MRN: [id number] BP 108/62  Pulse 88  Temp(Src) 98 F (36.7 C) (Oral)  Resp 16  Ht 5\' 5"  (1.651 m)  Wt 73.93 kg (162 lb 15.8 oz)  BMI 27.12 kg/m2  SpO2 96% Alert and oriented x 4 Moving all extremities well Wound is clean, dry, without signs of infection Doing well Discharge tomorrow

## 2013-07-16 NOTE — Care Management Note (Unsigned)
    Page 1 of 2   07/16/2013     11:31:41 AM   CARE MANAGEMENT NOTE 07/16/2013  Patient:  Monica Delacruz, Monica Delacruz   Account Number:  [id number]  Date Initiated:  07/16/2013  Documentation initiated by:  Elmer Bales  Subjective/Objective Assessment:   patient admitted for throacic fusion with pedicle screw placement     Action/Plan:   Will follow for discharge needs.   Anticipated DC Date:  07/16/2013   Anticipated DC Plan:  HOME W HOME HEALTH SERVICES      DC Planning Services  CM consult      Choice offered to / List presented to:  C-1 Patient   DME arranged  3-N-1      DME agency  [facility]     HH arranged  HH-2 PT  HH-3 OT      [facility] agency  [facility]   Status of service:  Completed, signed off Medicare Important Message given?   (If response is "NO", the following Medicare IM given date fields will be blank) Date Medicare IM given:   Date Additional Medicare IM given:    Discharge Disposition:    Per UR Regulation:    If discussed at Long Length of Stay Meetings, dates discussed:    Comments:  07/16/13 1120 Elmer Bales RN, MSN, CM- Met with patient to discuss home health needs. Patient has chosen [place] for PT/OT, which she has used in the past. Corrie Dandy with [facility] was notified and has accepted the referral. [facility] was notified of order for 3n1 and possible discharge home today.  Patient has both walker and cane at home.
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
## 2013-07-17 NOTE — Plan of Care (Signed)
D/c instructions given to pt. Med scripts given to pt. V/u. Pt is stable to be d/c.

## 2013-07-22 NOTE — Discharge Summary (Signed)
Physician Discharge Summary  Patient ID: Monica Delacruz MRN: [id number] DOB/AGE: December 21, 1950 62 y.o.  Admit date: 07/12/2013 Discharge date: 07/22/2013  Admission Diagnoses:Lumbar fracture L1, Thoracic fracture T12   Discharge Diagnoses: Lumbar fracture L1, Thoracic fracture T12  Active Problems:   * No active hospital problems. *   Discharged Condition: good  Hospital Course: [name] was admitted and taken to the operating room where she underwent a thoracolumbar fusion for the compression fractures at L1, and T12. Post op she progressed very well, voiding, ambulating and tolerating a regular diet. Her wound at discharge is clean dry, and without signs of infection.   Consults: None  Significant Diagnostic Studies: none  Treatments: surgery: Thoracic Ten Through Lumbar Three posterior lateral fusion morselized allograft Segmental pedicle screw placement T10-L3, Nuvasive Atlas system, Synthes cross link   Discharge Exam: Blood pressure 118/65, pulse 85, temperature 97.9 F (36.6 C), temperature source Oral, resp. rate 16, height 5\' 5"  (1.651 m), weight 73.93 kg (162 lb 15.8 oz), SpO2 100.00%. General appearance: alert, cooperative, appears stated age and no distress Neurologic: Alert and oriented X 3, normal strength and tone. Normal symmetric reflexes. Normal coordination and gait  Disposition: 01-Home or Self Care  Discharge Orders   Future Orders Complete By Expires   For home use only DME Bedside commode  As directed        Medication List    STOP taking these medications       ibuprofen 200 MG tablet  Commonly known as:  ADVIL,MOTRIN     oxyCODONE-acetaminophen 10-325 MG per tablet  Commonly known as:  PERCOCET  Replaced by:  oxyCODONE-acetaminophen 5-325 MG per tablet      TAKE these medications       cyclobenzaprine 10 MG tablet  Commonly known as:  FLEXERIL  Take 1 tablet (10 mg total) by mouth 3 (three) times daily as needed for muscle spasms.     DULERA 200-5 MCG/ACT Aero  Generic drug:  mometasone-formoterol  Inhale 2 puffs into the lungs 2 (two) times daily.     fluticasone 50 MCG/ACT nasal spray  Commonly known as:  FLONASE  Place 2 sprays into the nose daily.     loratadine 10 MG tablet  Commonly known as:  CLARITIN  Take 10 mg by mouth daily.     lovastatin 20 MG tablet  Commonly known as:  MEVACOR  Take 20 mg by mouth at bedtime.     montelukast 10 MG tablet  Commonly known as:  SINGULAIR  Take 10 mg by mouth at bedtime.     oxyCODONE-acetaminophen 5-325 MG per tablet  Commonly known as:  PERCOCET/ROXICET  Take 1-2 tablets by mouth every 6 (six) hours as needed for pain.           Follow-up Information   Follow up with Steen Bisig L, MD In 3 weeks. (call to make appointment)    Specialty:  Neurosurgery   Contact information:   [address]                         [address] [place] [address] [phone]       Signed: Kimaria Struthers L 07/22/2013, 9:03 AM

## 2013-10-04 ENCOUNTER — Ambulatory Visit: Payer: Self-pay | Admitting: Family Medicine

## 2013-10-10 ENCOUNTER — Other Ambulatory Visit: Payer: Self-pay | Admitting: Neurosurgery

## 2013-10-22 ENCOUNTER — Other Ambulatory Visit: Payer: Self-pay | Admitting: Family Medicine

## 2013-10-22 DIAGNOSIS — R102 Pelvic and perineal pain: Secondary | ICD-10-CM

## 2013-10-22 DIAGNOSIS — R109 Unspecified abdominal pain: Secondary | ICD-10-CM

## 2013-10-23 ENCOUNTER — Ambulatory Visit
Admission: RE | Admit: 2013-10-23 | Discharge: 2013-10-23 | Disposition: A | Discharge status: Home / Self Care | Payer: BC Managed Care – PPO | Source: Ambulatory Visit | Attending: Neurosurgery | Admitting: Neurosurgery

## 2013-10-23 ENCOUNTER — Ambulatory Visit
Admission: RE | Admit: 2013-10-23 | Discharge: 2013-10-23 | Disposition: A | Discharge status: Home / Self Care | Payer: BC Managed Care – PPO | Source: Ambulatory Visit | Attending: Family Medicine | Admitting: Family Medicine

## 2013-10-23 DIAGNOSIS — R109 Unspecified abdominal pain: Secondary | ICD-10-CM

## 2013-10-23 DIAGNOSIS — R102 Pelvic and perineal pain: Secondary | ICD-10-CM

## 2013-10-23 MED ORDER — GADOBENATE DIMEGLUMINE 529 MG/ML IV SOLN
15.0000 mL | Freq: Once | INTRAVENOUS | Status: AC | PRN
  Administered 2013-10-23: 15 mL via INTRAVENOUS

## 2013-10-23 MED ORDER — IOHEXOL 300 MG/ML  SOLN
100.0000 mL | Freq: Once | INTRAMUSCULAR | Status: AC | PRN
  Administered 2013-10-23: 100 mL via INTRAVENOUS

## 2014-06-24 LAB — HM PAP SMEAR: HM Pap smear: NORMAL

## 2014-09-09 ENCOUNTER — Ambulatory Visit: Payer: Self-pay | Admitting: Family Medicine

## 2014-09-09 LAB — HM MAMMOGRAPHY

## 2014-09-09 LAB — HM DEXA SCAN

## 2016-08-23 ENCOUNTER — Ambulatory Visit (INDEPENDENT_AMBULATORY_CARE_PROVIDER_SITE_OTHER): Payer: BLUE CROSS/BLUE SHIELD | Admitting: Family Medicine

## 2016-08-23 ENCOUNTER — Encounter: Payer: Self-pay | Admitting: Family Medicine

## 2016-08-23 VITALS — BP 144/96 | HR 88 | Temp 97.9°F | Resp 16 | Ht 64.0 in | Wt 176.2 lb

## 2016-08-23 DIAGNOSIS — M545 Low back pain: Secondary | ICD-10-CM

## 2016-08-23 DIAGNOSIS — G8929 Other chronic pain: Secondary | ICD-10-CM | POA: Diagnosis not present

## 2016-08-23 DIAGNOSIS — E559 Vitamin D deficiency, unspecified: Secondary | ICD-10-CM

## 2016-08-23 DIAGNOSIS — E7800 Pure hypercholesterolemia, unspecified: Secondary | ICD-10-CM | POA: Diagnosis not present

## 2016-08-23 DIAGNOSIS — J45909 Unspecified asthma, uncomplicated: Secondary | ICD-10-CM | POA: Insufficient documentation

## 2016-08-23 DIAGNOSIS — IMO0002 Reserved for concepts with insufficient information to code with codable children: Secondary | ICD-10-CM | POA: Insufficient documentation

## 2016-08-23 DIAGNOSIS — K649 Unspecified hemorrhoids: Secondary | ICD-10-CM | POA: Insufficient documentation

## 2016-08-23 DIAGNOSIS — M5430 Sciatica, unspecified side: Secondary | ICD-10-CM | POA: Insufficient documentation

## 2016-08-23 DIAGNOSIS — O039 Complete or unspecified spontaneous abortion without complication: Secondary | ICD-10-CM | POA: Insufficient documentation

## 2016-08-23 DIAGNOSIS — K2100 Gastro-esophageal reflux disease with esophagitis, without bleeding: Secondary | ICD-10-CM | POA: Insufficient documentation

## 2016-08-23 DIAGNOSIS — G43109 Migraine with aura, not intractable, without status migrainosus: Secondary | ICD-10-CM | POA: Diagnosis not present

## 2016-08-23 DIAGNOSIS — Z23 Encounter for immunization: Secondary | ICD-10-CM

## 2016-08-23 DIAGNOSIS — M1990 Unspecified osteoarthritis, unspecified site: Secondary | ICD-10-CM | POA: Insufficient documentation

## 2016-08-23 DIAGNOSIS — Z0000 Encounter for general adult medical examination without abnormal findings: Secondary | ICD-10-CM

## 2016-08-23 MED ORDER — BUTALBITAL-APAP-CAFF-COD 50-300-40-30 MG PO CAPS: 1.0000 | ORAL_CAPSULE | Freq: Four times a day (QID) | ORAL | 0 refills | Status: DC | PRN

## 2016-08-23 NOTE — Progress Notes (Signed)
Patient: Monica Delacruz, Female    DOB: Nov 19, 1950, 65 y.o.   MRN: [id number] Visit Date: 08/23/2016  Today's Provider: Vernie Murders, PA   Chief Complaint  Patient presents with  . Annual Exam   Subjective:    Annual physical exam Monica Delacruz is a 65 y.o. female who presents today for health maintenance and complete physical. She feels well. She reports exercising 3-4 times per week. She reports she is sleeping well (average 6-7 hours per week).  -----------------------------------------------------------------   Review of Systems  Constitutional: Positive for appetite change.  HENT: Negative.   Eyes: Negative.   Respiratory: Negative.   Cardiovascular: Negative.   Gastrointestinal: Negative.   Endocrine: Negative.   Genitourinary: Negative.   Musculoskeletal: Positive for back pain.  Skin: Negative.   Allergic/Immunologic: Negative.   Neurological: Negative.   Hematological: Negative.   Psychiatric/Behavioral: Negative.     Social History      She  reports that she has never smoked. She does not have any smokeless tobacco history on file. She reports that she drinks alcohol. She reports that she does not use drugs.       Social History   Social History  . Marital status: Married    Spouse name: N/A  . Number of children: N/A  . Years of education: N/A   Social History Main Topics  . Smoking status: Never Smoker  . Smokeless tobacco: None  . Alcohol use Yes     Comment: wine rarely  . Drug use: No  . Sexual activity: Yes    Birth control/ protection: Surgical   Other Topics Concern  . None   Social History Narrative  . None    Past Medical History:  Diagnosis Date  . Asthma    takes Singulair daily and Dulera prn  . Back pain    lumbar fracture  . Diverticulosis   . History of bronchitis    2014  . History of colon polyps   . History of kidney stones   . History of migraine    last one about 2wks ago  . Hyperlipidemia    takes Lovasatin daily  . Seasonal allergies    takes Claritin daily and Flonase daily     Patient Active Problem List   Diagnosis Date Noted  . Abortion, spontaneous 08/23/2016  . Compression fracture 08/23/2016  . Esophagitis, reflux 08/23/2016  . Hemorrhoid 08/23/2016  . Arthritis, degenerative 08/23/2016  . RAD (reactive airway disease) 08/23/2016  . Hypercholesterolemia without hypertriglyceridemia 08/23/2016  . Neuralgia neuritis, sciatic nerve 08/23/2016  . Avitaminosis D 08/23/2016  . LBP (low back pain) 01/11/2010  . Herniation of nucleus pulposus 10/02/2009  . Osteopenia 10/02/2009    Past Surgical History:  Procedure Laterality Date  . ABDOMINAL HYSTERECTOMY  1985  . COLONOSCOPY    . CYSTOSCOPY    . FOOT SURGERY     bilateral  . JOINT REPLACEMENT Left 2009   knee  . LAMINECTOMY WITH POSTERIOR LATERAL ARTHRODESIS LEVEL 4 N/A 07/12/2013   Procedure: Thoracic Ten Through Lumbar Three posterior lateral fusion with instrumentation;  Surgeon: Winfield Cunas, MD;  Location: [facility];  Service: Neurosurgery;  Laterality: N/A;  Thoracic Ten Through Lumbar Three posterior lateral fusion with instrumentation  . LITHOTRIPSY     x 2  . NASAL SINUS SURGERY     x 2    Family History        No family status information on  file.        Her family history is not on file.    Allergies  Allergen Reactions  . Adhesive [Tape] Other (See Comments)    Blisters skin  . Aspirin Other (See Comments)    Burning. Burns stomach  . Hydrocodone Itching  . Ofloxacin Nausea Only  . Oxycodone Hcl     Weird dreams  . Simvastatin     GI upset  . Sulfa Antibiotics Hives and Itching  . Tramadol Diarrhea, Nausea Only and Nausea And Vomiting    Current Meds  Medication Sig  . Butalbital-APAP-Caff-Cod 50-300-40-30 MG CAPS   . Calcium Carb-Cholecalciferol (CALTRATE 600+D3 SOFT PO) Take by mouth.  Marland Kitchen MEGARED OMEGA-3 KRILL OIL PO Take by mouth.  . Multiple Vitamins-Minerals (WOMENS  MULTIVITAMIN PO) Take by mouth.  . Turmeric 500 MG CAPS Take by mouth.    Patient Care Team: Margo Common, PA as PCP - General (Family Medicine)     Objective:   Vitals: BP (!) 144/96 (BP Location: Right Arm, Patient Position: Sitting, Cuff Size: Normal)   Pulse 88   Temp 97.9 F (36.6 C) (Oral)   Resp 16   Ht 5\' 4"  (1.626 m)   Wt 176 lb 3.2 oz (79.9 kg)   BMI 30.24 kg/m    Physical Exam  Constitutional: She is oriented to person, place, and time. She appears well-developed and well-nourished.  HENT:  Head: Normocephalic and atraumatic.  Right Ear: External ear normal.  Left Ear: External ear normal.  Nose: Nose normal.  Mouth/Throat: Oropharynx is clear and moist.  Eyes: Conjunctivae and EOM are normal. Pupils are equal, round, and reactive to light. Right eye exhibits no discharge.  Neck: Normal range of motion. Neck supple. No tracheal deviation present. No thyromegaly present.  Cardiovascular: Normal rate, regular rhythm, normal heart sounds and intact distal pulses.   No murmur heard. Pulmonary/Chest: Effort normal and breath sounds normal. No respiratory distress. She has no wheezes. She has no rales. She exhibits no tenderness.  Abdominal: Soft. She exhibits no distension and no mass. There is no tenderness. There is no rebound and no guarding.  Genitourinary:  Genitourinary Comments: Abdominal hysterectomy in 1995.  Musculoskeletal: She exhibits no edema.  Well healed scars over spine from past injury and surgery. Very stiff ROM. Scars of the left knee well healed from joint replacement. Great toenails have been surgically removed. Good gait without muscle weakness.  Lymphadenopathy:    She has no cervical adenopathy.  Neurological: She is alert and oriented to person, place, and time. She has normal reflexes. No cranial nerve deficit. She exhibits normal muscle tone. Coordination normal.  Skin: Skin is warm and dry. No rash noted. No erythema.  Psychiatric: She  has a normal mood and affect. Her behavior is normal. Judgment and thought content normal.     Depression Screen PHQ 2/9 Scores 10/14/2016  PHQ - 2 Score 0    Assessment & Plan:     Routine Health Maintenance and Physical Exam  Exercise Activities and Dietary recommendations Goals    Continue exercise on treadmill and upper extremity weight 3-4 times a week.      Immunization History  Administered Date(s) Administered  . Influenza,inj,Quad PF,36+ Mos 07/14/2013  . Tdap 06/17/2011    Health Maintenance  Topic Date Due  . Hepatitis C Screening  1950/12/13  . HIV Screening  07/06/1967  . TETANUS/TDAP  07/06/1971  . PAP SMEAR  07/05/1973  . MAMMOGRAM  07/05/2002  .  COLONOSCOPY  07/05/2002  . ZOSTAVAX  07/05/2012  . INFLUENZA VACCINE  05/24/2016      Discussed health benefits of physical activity, and encouraged her to engage in regular exercise appropriate for her age and condition.    -------------------------------------------------------------------- 1. Annual physical exam General health good. Overweight and recommend regular exercise and decrease in caloric intake. Last colonoscopy 08-05-11 showed benign rectal polyps and diverticulosis. Due for repeat exam in 1 year.Immunizations up to date.  2. Avitaminosis D  Blood levels down to 21.9 in 2013. Slow to recover. Will continue Calcium with Vitamin-D and separate multivitamin. Recheck blood levels. - Calcium Carb-Cholecalciferol (CALTRATE 600+D3 SOFT PO); Take 1 tablet by mouth daily.  - Multiple Vitamins-Minerals (WOMENS MULTIVITAMIN PO); Take 1 tablet by mouth daily.  - CBC with Differential/Platelet - VITAMIN D 25 Hydroxy (Vit-D Deficiency, Fractures)  3. Hypercholesterolemia without hypertriglyceridemia Tolerating Meg-Red qd with low fat diet. Need to lose a little weight. Recheck labs to see if she need to get back on a statin. - MEGARED OMEGA-3 KRILL OIL PO; Take 1 tablet by mouth daily.  - CBC with  Differential/Platelet - Comprehensive metabolic panel - Lipid panel - TSH  4. Chronic midline low back pain, with sciatica presence unspecified Chronic pain since she fell on her deck at home while pressure washing it. Had fractures requiring rods and pins T10 through L3 on 07-12-13. Used Ibuprofen 200 mg 4 tablets TID prn. May use Turmeric, also. Recheck prn. - Turmeric 500 MG CAPS; Take 500 mg by mouth daily.   5. Migraine with aura and without status migrainosus, not intractable Occasional migraine. Advil Migraine usually stops it if she catches it early. If no relieved, will use Fioricet with Codeine (used #30 in 2016). Will refill medication and recheck prn. - Butalbital-APAP-Caff-Cod 50-300-40-30 MG CAPS; Take 1 tablet by mouth 4 (four) times daily as needed. For migraines.  Dispense: 30 capsule; Refill: 0  6. Need for influenza vaccination - Flu Vaccine QUAD 36+ mos PF IM (Fluarix & Fluzone Quad PF)    Vernie Murders, PA  [facility]

## 2016-08-24 LAB — LIPID PANEL
Chol/HDL Ratio: 4.1 ratio units (ref 0.0–4.4)
Cholesterol, Total: 282 mg/dL — ABNORMAL HIGH (ref 100–199)
HDL: 68 mg/dL (ref >39)
LDL Calculated: 192 mg/dL — ABNORMAL HIGH (ref 0–99)
Triglycerides: 108 mg/dL (ref 0–149)
VLDL Cholesterol Cal: 22 mg/dL (ref 5–40)

## 2016-08-24 LAB — COMPREHENSIVE METABOLIC PANEL
ALT: 44 IU/L — ABNORMAL HIGH (ref 0–32)
AST: 29 IU/L (ref 0–40)
Albumin/Globulin Ratio: 1.8 (ref 1.2–2.2)
Albumin: 4.7 g/dL (ref 3.6–4.8)
Alkaline Phosphatase: 110 IU/L (ref 39–117)
BUN/Creatinine Ratio: 20 (ref 12–28)
BUN: 17 mg/dL (ref 8–27)
Bilirubin Total: 0.6 mg/dL (ref 0.0–1.2)
CO2: 25 mmol/L (ref 18–29)
Calcium: 9.7 mg/dL (ref 8.7–10.3)
Chloride: 102 mmol/L (ref 96–106)
Creatinine, Ser: 0.84 mg/dL (ref 0.57–1.00)
GFR calc Af Amer: 85 mL/min/{1.73_m2} (ref >59)
GFR calc non Af Amer: 74 mL/min/{1.73_m2} (ref >59)
Globulin, Total: 2.6 g/dL (ref 1.5–4.5)
Glucose: 96 mg/dL (ref 65–99)
Potassium: 4.6 mmol/L (ref 3.5–5.2)
Sodium: 142 mmol/L (ref 134–144)
Total Protein: 7.3 g/dL (ref 6.0–8.5)

## 2016-08-24 LAB — CBC WITH DIFFERENTIAL/PLATELET
Basophils Absolute: 0.1 10*3/uL (ref 0.0–0.2)
Basos: 1 %
EOS (ABSOLUTE): 0.1 10*3/uL (ref 0.0–0.4)
Eos: 1 %
Hematocrit: 42.6 % (ref 34.0–46.6)
Hemoglobin: 14.4 g/dL (ref 11.1–15.9)
Immature Grans (Abs): 0 10*3/uL (ref 0.0–0.1)
Immature Granulocytes: 0 %
Lymphocytes Absolute: 2.5 10*3/uL (ref 0.7–3.1)
Lymphs: 33 %
MCH: 29.9 pg (ref 26.6–33.0)
MCHC: 33.8 g/dL (ref 31.5–35.7)
MCV: 88 fL (ref 79–97)
Monocytes Absolute: 0.6 10*3/uL (ref 0.1–0.9)
Monocytes: 8 %
Neutrophils Absolute: 4.4 10*3/uL (ref 1.4–7.0)
Neutrophils: 57 %
Platelets: 306 10*3/uL (ref 150–379)
RBC: 4.82 x10E6/uL (ref 3.77–5.28)
RDW: 13.4 % (ref 12.3–15.4)
WBC: 7.6 10*3/uL (ref 3.4–10.8)

## 2016-08-24 LAB — VITAMIN D 25 HYDROXY (VIT D DEFICIENCY, FRACTURES): Vit D, 25-Hydroxy: 66.8 ng/mL (ref 30.0–100.0)

## 2016-08-24 LAB — TSH: TSH: 2.06 u[IU]/mL (ref 0.450–4.500)

## 2016-08-25 ENCOUNTER — Other Ambulatory Visit: Payer: Self-pay | Admitting: Family Medicine

## 2016-08-25 ENCOUNTER — Telehealth: Payer: Self-pay

## 2016-08-25 DIAGNOSIS — Z1231 Encounter for screening mammogram for malignant neoplasm of breast: Secondary | ICD-10-CM

## 2016-08-25 NOTE — Telephone Encounter (Signed)
Patient has been advised she states that she informed you at her last visit that she has not been on Lovastatin since 2015. Patient agreed to starting medication again, please specify quantity and number of refills to send in. Thanks [employer]

## 2016-08-25 NOTE — Telephone Encounter (Signed)
Notice she had GI upset with the Simvastatin. Did this happen with the Lovastatin? If not, probably should start at the 20 mg qd first.

## 2016-08-25 NOTE — Telephone Encounter (Signed)
Spoke with patient on phone she states that she d/c Lovastatin for several reasons. One being she states that it gave her G.I upset, insurance changed, pharmacy was going to charge her $104 for prescription and patient states when on medication cholesterol never improved. Patient states that she would like to try mediation to lower cholesterol just not Lovastatin. KW

## 2016-08-25 NOTE — Telephone Encounter (Signed)
-----   Message from Margo Common, [place] sent at 08/24/2016  6:04 PM EDT ----- All blood tests essentially normal except LDL high (goal < 100). Should continue low fat diet and increase Lovastatin to 40 mg qd. Recheck progress in 3 months.

## 2016-08-26 NOTE — Telephone Encounter (Signed)
Patient has been advised. KW 

## 2016-08-26 NOTE — Telephone Encounter (Signed)
Try to concentrate on low fat diet, exercise regularly (30 minutes 4-5 days a week), continue Krill Oil daily and may add Metamucil 1 teaspoon in 4-6 ounces of juice or water twice a day before going to another statin. Some have gotten help from [facility]. Recheck progress in 3 months.

## 2016-08-28 ENCOUNTER — Emergency Department: Payer: BLUE CROSS/BLUE SHIELD

## 2016-08-28 ENCOUNTER — Inpatient Hospital Stay
Admission: EM | Admit: 2016-08-28 | Discharge: 2016-08-31 | DRG: 872 | Disposition: A | Discharge status: Home / Self Care | Payer: BLUE CROSS/BLUE SHIELD | Attending: Urology | Admitting: Urology

## 2016-08-28 DIAGNOSIS — J449 Chronic obstructive pulmonary disease, unspecified: Secondary | ICD-10-CM | POA: Diagnosis present

## 2016-08-28 DIAGNOSIS — A419 Sepsis, unspecified organism: Principal | ICD-10-CM | POA: Diagnosis present

## 2016-08-28 DIAGNOSIS — N200 Calculus of kidney: Secondary | ICD-10-CM | POA: Diagnosis not present

## 2016-08-28 DIAGNOSIS — Z23 Encounter for immunization: Secondary | ICD-10-CM

## 2016-08-28 DIAGNOSIS — E7800 Pure hypercholesterolemia, unspecified: Secondary | ICD-10-CM | POA: Diagnosis present

## 2016-08-28 DIAGNOSIS — Z96652 Presence of left artificial knee joint: Secondary | ICD-10-CM | POA: Diagnosis present

## 2016-08-28 DIAGNOSIS — N202 Calculus of kidney with calculus of ureter: Secondary | ICD-10-CM | POA: Diagnosis present

## 2016-08-28 DIAGNOSIS — N201 Calculus of ureter: Secondary | ICD-10-CM | POA: Diagnosis present

## 2016-08-28 DIAGNOSIS — Z79899 Other long term (current) drug therapy: Secondary | ICD-10-CM

## 2016-08-28 DIAGNOSIS — Z981 Arthrodesis status: Secondary | ICD-10-CM

## 2016-08-28 DIAGNOSIS — N12 Tubulo-interstitial nephritis, not specified as acute or chronic: Secondary | ICD-10-CM | POA: Diagnosis present

## 2016-08-28 DIAGNOSIS — K219 Gastro-esophageal reflux disease without esophagitis: Secondary | ICD-10-CM | POA: Diagnosis present

## 2016-08-28 LAB — CBC
HCT: 37.1 % (ref 35.0–47.0)
Hemoglobin: 12.9 g/dL (ref 12.0–16.0)
MCH: 30.6 pg (ref 26.0–34.0)
MCHC: 34.7 g/dL (ref 32.0–36.0)
MCV: 88.2 fL (ref 80.0–100.0)
Platelets: 169 10*3/uL (ref 150–440)
RBC: 4.21 MIL/uL (ref 3.80–5.20)
RDW: 13.4 % (ref 11.5–14.5)
WBC: 4.2 10*3/uL (ref 3.6–11.0)

## 2016-08-28 LAB — COMPREHENSIVE METABOLIC PANEL
ALT: 65 U/L — ABNORMAL HIGH (ref 14–54)
AST: 120 U/L — ABNORMAL HIGH (ref 15–41)
Albumin: 3.7 g/dL (ref 3.5–5.0)
Alkaline Phosphatase: 85 U/L (ref 38–126)
Anion gap: 8 (ref 5–15)
BUN: 21 mg/dL — ABNORMAL HIGH (ref 6–20)
CO2: 23 mmol/L (ref 22–32)
Calcium: 8.6 mg/dL — ABNORMAL LOW (ref 8.9–10.3)
Chloride: 108 mmol/L (ref 101–111)
Creatinine, Ser: 0.91 mg/dL (ref 0.44–1.00)
GFR calc Af Amer: >60 mL/min (ref >60)
GFR calc non Af Amer: >60 mL/min (ref >60)
Glucose, Bld: 151 mg/dL — ABNORMAL HIGH (ref 65–99)
Potassium: 3.3 mmol/L — ABNORMAL LOW (ref 3.5–5.1)
Sodium: 139 mmol/L (ref 135–145)
Total Bilirubin: 0.7 mg/dL (ref 0.3–1.2)
Total Protein: 6.3 g/dL — ABNORMAL LOW (ref 6.5–8.1)

## 2016-08-28 LAB — LIPASE, BLOOD: Lipase: 33 U/L (ref 11–51)

## 2016-08-28 MED ORDER — MORPHINE SULFATE (PF) 2 MG/ML IV SOLN
INTRAVENOUS | Status: AC
  Filled 2016-08-28: qty 1

## 2016-08-28 MED ORDER — ONDANSETRON HCL 4 MG/2ML IJ SOLN
4.0000 mg | Freq: Once | INTRAMUSCULAR | Status: AC
  Administered 2016-08-28: 4 mg via INTRAVENOUS

## 2016-08-28 MED ORDER — ONDANSETRON HCL 4 MG/2ML IJ SOLN
INTRAMUSCULAR | Status: AC
  Administered 2016-08-28: 4 mg via INTRAVENOUS
  Filled 2016-08-28: qty 2

## 2016-08-28 MED ORDER — DIPHENHYDRAMINE HCL 50 MG/ML IJ SOLN
12.5000 mg | Freq: Once | INTRAMUSCULAR | Status: AC
  Administered 2016-08-28: 12.5 mg via INTRAVENOUS
  Filled 2016-08-28: qty 1

## 2016-08-28 MED ORDER — MORPHINE SULFATE (PF) 2 MG/ML IV SOLN
4.0000 mg | Freq: Once | INTRAVENOUS | Status: AC
  Administered 2016-08-28: 4 mg via INTRAVENOUS

## 2016-08-28 MED ORDER — MORPHINE SULFATE (PF) 2 MG/ML IV SOLN
INTRAVENOUS | Status: AC
  Administered 2016-08-28: 4 mg via INTRAVENOUS
  Filled 2016-08-28: qty 1

## 2016-08-28 NOTE — ED Notes (Signed)
Patient transported to CT 

## 2016-08-28 NOTE — ED Triage Notes (Signed)
Reports right flank pain that radiates to right lower abdomen all day worse now. With nausea and vomiting.

## 2016-08-29 ENCOUNTER — Ambulatory Visit: Payer: Self-pay

## 2016-08-29 ENCOUNTER — Emergency Department: Payer: BLUE CROSS/BLUE SHIELD | Admitting: Anesthesiology

## 2016-08-29 ENCOUNTER — Encounter: Payer: Self-pay | Admitting: Anesthesiology

## 2016-08-29 ENCOUNTER — Encounter
Admission: EM | Disposition: A | Discharge status: Home / Self Care | Payer: Self-pay | Source: Home / Self Care | Attending: Urology

## 2016-08-29 DIAGNOSIS — Z96652 Presence of left artificial knee joint: Secondary | ICD-10-CM | POA: Diagnosis present

## 2016-08-29 DIAGNOSIS — N200 Calculus of kidney: Secondary | ICD-10-CM | POA: Diagnosis not present

## 2016-08-29 DIAGNOSIS — N12 Tubulo-interstitial nephritis, not specified as acute or chronic: Secondary | ICD-10-CM | POA: Diagnosis not present

## 2016-08-29 DIAGNOSIS — Z79899 Other long term (current) drug therapy: Secondary | ICD-10-CM | POA: Diagnosis not present

## 2016-08-29 DIAGNOSIS — N201 Calculus of ureter: Secondary | ICD-10-CM | POA: Diagnosis present

## 2016-08-29 DIAGNOSIS — Z981 Arthrodesis status: Secondary | ICD-10-CM | POA: Diagnosis not present

## 2016-08-29 DIAGNOSIS — Z23 Encounter for immunization: Secondary | ICD-10-CM | POA: Diagnosis not present

## 2016-08-29 DIAGNOSIS — A419 Sepsis, unspecified organism: Secondary | ICD-10-CM | POA: Diagnosis present

## 2016-08-29 DIAGNOSIS — J449 Chronic obstructive pulmonary disease, unspecified: Secondary | ICD-10-CM | POA: Diagnosis present

## 2016-08-29 DIAGNOSIS — N202 Calculus of kidney with calculus of ureter: Secondary | ICD-10-CM | POA: Diagnosis present

## 2016-08-29 DIAGNOSIS — K219 Gastro-esophageal reflux disease without esophagitis: Secondary | ICD-10-CM | POA: Diagnosis present

## 2016-08-29 DIAGNOSIS — E7800 Pure hypercholesterolemia, unspecified: Secondary | ICD-10-CM | POA: Diagnosis present

## 2016-08-29 HISTORY — PX: CYSTOSCOPY/RETROGRADE/URETEROSCOPY: SHX5316

## 2016-08-29 LAB — BASIC METABOLIC PANEL
Anion gap: 12 (ref 5–15)
BUN: 19 mg/dL (ref 6–20)
CO2: 21 mmol/L — ABNORMAL LOW (ref 22–32)
Calcium: 8.3 mg/dL — ABNORMAL LOW (ref 8.9–10.3)
Chloride: 105 mmol/L (ref 101–111)
Creatinine, Ser: 1.07 mg/dL — ABNORMAL HIGH (ref 0.44–1.00)
GFR calc Af Amer: >60 mL/min (ref >60)
GFR calc non Af Amer: 54 mL/min — ABNORMAL LOW (ref >60)
Glucose, Bld: 187 mg/dL — ABNORMAL HIGH (ref 65–99)
Potassium: 3.9 mmol/L (ref 3.5–5.1)
Sodium: 138 mmol/L (ref 135–145)

## 2016-08-29 LAB — URINALYSIS COMPLETE WITH MICROSCOPIC (ARMC ONLY)
Bilirubin Urine: NEGATIVE
Glucose, UA: NEGATIVE mg/dL
Ketones, ur: NEGATIVE mg/dL
Nitrite: POSITIVE — AB
Protein, ur: NEGATIVE mg/dL
Specific Gravity, Urine: 1.013 (ref 1.005–1.030)
pH: 5 (ref 5.0–8.0)

## 2016-08-29 LAB — CBC
HCT: 35.8 % (ref 35.0–47.0)
Hemoglobin: 12 g/dL (ref 12.0–16.0)
MCH: 30.3 pg (ref 26.0–34.0)
MCHC: 33.4 g/dL (ref 32.0–36.0)
MCV: 90.6 fL (ref 80.0–100.0)
Platelets: 164 10*3/uL (ref 150–440)
RBC: 3.95 MIL/uL (ref 3.80–5.20)
RDW: 13.5 % (ref 11.5–14.5)
WBC: 20.1 10*3/uL — ABNORMAL HIGH (ref 3.6–11.0)

## 2016-08-29 SURGERY — CYSTOSCOPY/RETROGRADE/URETEROSCOPY
Anesthesia: General | Site: Ureter | Laterality: Right | Wound class: Clean Contaminated

## 2016-08-29 MED ORDER — FENTANYL CITRATE (PF) 100 MCG/2ML IJ SOLN
25.0000 ug | INTRAMUSCULAR | Status: DC | PRN
  Administered 2016-08-29 (×2): 25 ug via INTRAVENOUS

## 2016-08-29 MED ORDER — ROCURONIUM BROMIDE 100 MG/10ML IV SOLN
INTRAVENOUS | Status: DC | PRN
  Administered 2016-08-29: 15 mg via INTRAVENOUS
  Administered 2016-08-29: 10 mg via INTRAVENOUS

## 2016-08-29 MED ORDER — PROPOFOL 10 MG/ML IV BOLUS
INTRAVENOUS | Status: DC | PRN
  Administered 2016-08-29: 150 mg via INTRAVENOUS

## 2016-08-29 MED ORDER — ONDANSETRON HCL 4 MG/2ML IJ SOLN: 4.0000 mg | INTRAMUSCULAR | Status: DC | PRN

## 2016-08-29 MED ORDER — ONDANSETRON HCL 4 MG/2ML IJ SOLN: 4.0000 mg | Freq: Once | INTRAMUSCULAR | Status: DC | PRN

## 2016-08-29 MED ORDER — FENTANYL CITRATE (PF) 100 MCG/2ML IJ SOLN
INTRAMUSCULAR | Status: AC
  Administered 2016-08-29: 25 ug via INTRAVENOUS
  Filled 2016-08-29: qty 2

## 2016-08-29 MED ORDER — OXYCODONE HCL 5 MG PO TABS
5.0000 mg | ORAL_TABLET | ORAL | Status: DC | PRN
  Administered 2016-08-29 – 2016-08-30 (×6): 5 mg via ORAL
  Filled 2016-08-29 (×6): qty 1

## 2016-08-29 MED ORDER — IOPAMIDOL (ISOVUE-300) INJECTION 61%
INTRAVENOUS | Status: DC | PRN
  Administered 2016-08-29: 53 mL via URETHRAL

## 2016-08-29 MED ORDER — CEFTRIAXONE SODIUM-DEXTROSE 1-3.74 GM-% IV SOLR
1.0000 g | INTRAVENOUS | Status: DC
  Administered 2016-08-29 – 2016-08-30 (×2): 1 g via INTRAVENOUS
  Filled 2016-08-29 (×3): qty 50

## 2016-08-29 MED ORDER — ONDANSETRON HCL 4 MG/2ML IJ SOLN
4.0000 mg | Freq: Once | INTRAMUSCULAR | Status: AC
  Administered 2016-08-29: 4 mg via INTRAVENOUS
  Filled 2016-08-29: qty 2

## 2016-08-29 MED ORDER — LIDOCAINE HCL (CARDIAC) 20 MG/ML IV SOLN
INTRAVENOUS | Status: DC | PRN
  Administered 2016-08-29: 100 mg via INTRAVENOUS

## 2016-08-29 MED ORDER — ACETAMINOPHEN 500 MG PO TABS
1000.0000 mg | ORAL_TABLET | Freq: Once | ORAL | Status: AC
  Administered 2016-08-29: 1000 mg via ORAL

## 2016-08-29 MED ORDER — KCL IN DEXTROSE-NACL 20-5-0.45 MEQ/L-%-% IV SOLN
INTRAVENOUS | Status: DC
  Administered 2016-08-29 (×3): via INTRAVENOUS
  Administered 2016-08-30 (×2): 1000 mL via INTRAVENOUS
  Filled 2016-08-29 (×11): qty 1000

## 2016-08-29 MED ORDER — PHENYLEPHRINE HCL 10 MG/ML IJ SOLN
INTRAMUSCULAR | Status: DC | PRN
Start: 2016-08-29 — End: 2016-08-29
  Administered 2016-08-29 (×3): 100 ug via INTRAVENOUS

## 2016-08-29 MED ORDER — ACETAMINOPHEN 500 MG PO TABS
ORAL_TABLET | ORAL | Status: AC
  Administered 2016-08-29: 1000 mg via ORAL
  Filled 2016-08-29: qty 2

## 2016-08-29 MED ORDER — IPRATROPIUM-ALBUTEROL 0.5-2.5 (3) MG/3ML IN SOLN: 3.0000 mL | RESPIRATORY_TRACT | Status: DC

## 2016-08-29 MED ORDER — DEXTROSE 5 % IV SOLN: 1.0000 g | INTRAVENOUS | Status: DC

## 2016-08-29 MED ORDER — DEXAMETHASONE SODIUM PHOSPHATE 10 MG/ML IJ SOLN
INTRAMUSCULAR | Status: DC | PRN
Start: 2016-08-29 — End: 2016-08-29
  Administered 2016-08-29: 10 mg via INTRAVENOUS

## 2016-08-29 MED ORDER — IPRATROPIUM-ALBUTEROL 0.5-2.5 (3) MG/3ML IN SOLN
3.0000 mL | Freq: Once | RESPIRATORY_TRACT | Status: AC
  Administered 2016-08-29: 3 mL via RESPIRATORY_TRACT

## 2016-08-29 MED ORDER — FENTANYL CITRATE (PF) 100 MCG/2ML IJ SOLN
INTRAMUSCULAR | Status: DC | PRN
  Administered 2016-08-29: 100 ug via INTRAVENOUS

## 2016-08-29 MED ORDER — SUCCINYLCHOLINE CHLORIDE 20 MG/ML IJ SOLN
INTRAMUSCULAR | Status: DC | PRN
  Administered 2016-08-29: 100 mg via INTRAVENOUS

## 2016-08-29 MED ORDER — ALBUTEROL SULFATE (2.5 MG/3ML) 0.083% IN NEBU: 3.0000 mL | INHALATION_SOLUTION | RESPIRATORY_TRACT | Status: DC | PRN

## 2016-08-29 MED ORDER — CEFTRIAXONE SODIUM-DEXTROSE 1-3.74 GM-% IV SOLR
1.0000 g | Freq: Once | INTRAVENOUS | Status: AC
  Administered 2016-08-29: 1 g via INTRAVENOUS
  Filled 2016-08-29: qty 50

## 2016-08-29 MED ORDER — ACETAMINOPHEN 325 MG PO TABS: 650.0000 mg | ORAL_TABLET | ORAL | Status: DC | PRN

## 2016-08-29 MED ORDER — DIPHENHYDRAMINE HCL 25 MG PO CAPS
25.0000 mg | ORAL_CAPSULE | Freq: Three times a day (TID) | ORAL | Status: DC | PRN
  Administered 2016-08-29 (×2): 25 mg via ORAL
  Filled 2016-08-29 (×2): qty 1

## 2016-08-29 MED ORDER — DEXTROSE 5 % IV SOLN: 1.0000 g | Freq: Once | INTRAVENOUS | Status: DC

## 2016-08-29 MED ORDER — SODIUM CHLORIDE 0.9 % IV BOLUS (SEPSIS): 500.0000 mL | Freq: Once | INTRAVENOUS | Status: DC

## 2016-08-29 MED ORDER — PNEUMOCOCCAL VAC POLYVALENT 25 MCG/0.5ML IJ INJ
0.5000 mL | INJECTION | INTRAMUSCULAR | Status: AC
  Administered 2016-08-30: 0.5 mL via INTRAMUSCULAR
  Filled 2016-08-29: qty 0.5

## 2016-08-29 MED ORDER — CYCLOBENZAPRINE HCL 10 MG PO TABS
10.0000 mg | ORAL_TABLET | Freq: Three times a day (TID) | ORAL | Status: DC | PRN
Start: 2016-08-29 — End: 2016-08-31

## 2016-08-29 MED ORDER — HYDROMORPHONE HCL 1 MG/ML IJ SOLN
0.5000 mg | INTRAMUSCULAR | Status: DC | PRN
  Administered 2016-08-29: 1 mg via INTRAVENOUS
  Filled 2016-08-29: qty 1

## 2016-08-29 MED ORDER — SENNOSIDES-DOCUSATE SODIUM 8.6-50 MG PO TABS
1.0000 | ORAL_TABLET | Freq: Two times a day (BID) | ORAL | Status: DC
  Administered 2016-08-29 – 2016-08-30 (×4): 1 via ORAL
  Filled 2016-08-29 (×7): qty 1

## 2016-08-29 MED ORDER — MORPHINE SULFATE (PF) 2 MG/ML IV SOLN
4.0000 mg | Freq: Once | INTRAVENOUS | Status: DC
  Filled 2016-08-29: qty 2

## 2016-08-29 MED ORDER — DIPHENHYDRAMINE HCL 50 MG/ML IJ SOLN: 25.0000 mg | Freq: Three times a day (TID) | INTRAMUSCULAR | Status: DC | PRN

## 2016-08-29 MED ORDER — IPRATROPIUM-ALBUTEROL 0.5-2.5 (3) MG/3ML IN SOLN
RESPIRATORY_TRACT | Status: AC
Start: 2016-08-29 — End: 2016-08-29
  Administered 2016-08-29: 3 mL via RESPIRATORY_TRACT
  Filled 2016-08-29: qty 3

## 2016-08-29 MED ORDER — SUGAMMADEX SODIUM 200 MG/2ML IV SOLN
INTRAVENOUS | Status: DC | PRN
  Administered 2016-08-29: 159.8 mg via INTRAVENOUS

## 2016-08-29 MED ORDER — LACTATED RINGERS IV SOLN
INTRAVENOUS | Status: DC | PRN
  Administered 2016-08-29: 02:00:00 via INTRAVENOUS

## 2016-08-29 MED ORDER — ONDANSETRON HCL 4 MG/2ML IJ SOLN
INTRAMUSCULAR | Status: DC | PRN
  Administered 2016-08-29: 4 mg via INTRAVENOUS

## 2016-08-29 MED ORDER — ALBUTEROL SULFATE (2.5 MG/3ML) 0.083% IN NEBU
INHALATION_SOLUTION | RESPIRATORY_TRACT | Status: DC
Start: 2016-08-29 — End: 2016-08-29
  Filled 2016-08-29: qty 3

## 2016-08-29 SURGICAL SUPPLY — 24 items
BAG DRAIN CYSTO-URO LG1000N (MISCELLANEOUS) ×2 IMPLANT
BAG URINE DRAINAGE (UROLOGICAL SUPPLIES) ×2 IMPLANT
CATH SILICONE 16FRX5CC (CATHETERS) ×2 IMPLANT
CATH URETL OPEN END 6X70 (CATHETERS) ×2 IMPLANT
CONRAY 43 FOR UROLOGY 50M (MISCELLANEOUS) ×4 IMPLANT
GLOVE BIO SURGEON STRL SZ7 (GLOVE) ×4 IMPLANT
GLOVE BIO SURGEON STRL SZ7.5 (GLOVE) ×4 IMPLANT
GOWN STRL REUS W/ TWL LRG LVL3 (GOWN DISPOSABLE) ×2 IMPLANT
GOWN STRL REUS W/ TWL XL LVL3 (GOWN DISPOSABLE) IMPLANT
GOWN STRL REUS W/TWL LRG LVL3 (GOWN DISPOSABLE) ×2
GOWN STRL REUS W/TWL XL LVL3 (GOWN DISPOSABLE)
KIT RM TURNOVER CYSTO AR (KITS) ×2 IMPLANT
PACK CYSTO AR (MISCELLANEOUS) ×2 IMPLANT
PREP PVP WINGED SPONGE (MISCELLANEOUS) IMPLANT
SENSORWIRE 0.038 NOT ANGLED (WIRE) ×2
SOL .9 NS 3000ML IRR  AL (IV SOLUTION) ×1
SOL .9 NS 3000ML IRR UROMATIC (IV SOLUTION) ×1 IMPLANT
STENT URET 6FRX24 CONTOUR (STENTS) ×2 IMPLANT
SURGILUBE 2OZ TUBE FLIPTOP (MISCELLANEOUS) ×2 IMPLANT
SYRINGE 10CC LL (SYRINGE) ×4 IMPLANT
SYRINGE IRR TOOMEY STRL 70CC (SYRINGE) ×2 IMPLANT
TUBE FEED INF 8F (TUBING) IMPLANT
WATER STERILE IRR 1000ML POUR (IV SOLUTION) ×2 IMPLANT
WIRE SENSOR 0.038 NOT ANGLED (WIRE) ×1 IMPLANT

## 2016-08-29 NOTE — Op Note (Signed)
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  PROCEDURE PERFORMED: 1. Cystoscopy with right retrograde pyelogram interpretation. 2. Insertion of right ureteral stent, 6 x 24 Contour, no tether.  SURGEON:  Alexis Frock, MD  ESTIMATED BLOOD LOSS:  Nil.  COMPLICATIONS:  None.  SPECIMENS:  None.  FINDINGS: 1. Moderate right hydroureteronephrosis with ureteral tortuosity     consistent with chronically obstructing right distal ureteral     stone. 2. Successful placement of right ureteral stent, proximal in renal     pelvis and distal in urinary bladder.  INDICATION:  Monica Delacruz is a 65 year old lady with history of recurrent nephrolithiasis.  She was found on workup of colicky flank pain to have a right distal ureteral stone.  She also had tachycardia, malaise, and intraluminal gas consistent with emphysematous pyelitis.  This constellation of symptoms was clearly concerning for impending urosepsis with obstruction.  It was felt that urgent decompression was warranted. We discussed options of stenting versus nephrostomy tube, both agreed on a trial of stenting.  Informed consent was obtained and placed in the medical record.  DESCRIPTION OF PROCEDURE:  The patient being Monica Delacruz was verified. Procedure being right ureteral stent placement was confirmed.  Procedure was carried out.  Time-out was performed.  Intravenous antibiotics were administered.  General endotracheal anesthesia was introduced.  The patient was placed into a low lithotomy position.  Sterile field was created by prepping and draping the patient's vagina, introitus,  and proximal thighs using iodine x3.  Next, cystourethroscopy was performed using a 21-French rigid cystoscope with offset lens.  Inspection of urinary bladder revealed no diverticula, calcifications, or papillary lesions.  The right ureteral orifice was cannulated with a 6-French end- hole catheter and right retrograde pyelogram was obtained.  Right retrograde pyelogram demonstrated a single right ureter with single-system right kidney.  There was a filling defect in distal ureter consistent with a known stone.  There was some tortuosity of the ureter proximally consistent with likely long-term obstruction.  There was moderate hydronephrosis.  A 0.038 angled tip wire was then advanced at the level of renal pelvis over which a new 6 x 24, Contour type stent was placed.  Good proximal and distal deployment were noted.  Efflux of urine was seen around into the distal end of the stent.  A 16-French Foley catheter was then placed per urethra to straight drain 10 mL of sterile water in the balloon.  Procedure was terminated.  The patient tolerated the procedure well.  There were no immediate periprocedural complications. The patient was taken to postanesthesia care unit in stable condition.          ______________________________ Alexis Frock, MD     TM/MEDQ  D:  08/29/2016  T:  08/29/2016  Job:  [id number]

## 2016-08-29 NOTE — Progress Notes (Signed)
Pharmacy note: Antibiotic renal dosing Patient is not currently on any antibiotics that require dose adjustment for renal function.  Pharmacy will follow and manage as appropriate.

## 2016-08-29 NOTE — ED Provider Notes (Signed)
[facility] Emergency Department Provider Note   ____________________________________________   First MD Initiated Contact with Patient 08/28/16 2329     (approximate)  I have reviewed the triage vital signs and the nursing notes.   HISTORY  Chief Complaint Flank Pain    HPI Monica Delacruz is a 65 y.o. female who comes into the hospital today with some right-sided flank pain. The patient reports that she's never had pain like this before. The patient reports that her right side hurts and goes around to the front. The pain is also going down the leg. The patient has had some nausea and vomiting. She denies any fevers, pain with urination, blood in urine. The patient took 4 ibuprofen at 6 PM. The pain started around 5:30. She tried to take a muscle relaxer but vomited around 7:30. The patient rates her pain a 10 out of 10 in intensity. He reports that the pain is worse in her back and belly when she takes a deep breath. The patient denies any shortness of breath, headache, chest pain. The patient has had kidney stones in the past and has seen Dr. Eliberto Ivory.   Past Medical History:  Diagnosis Date  . Asthma    takes Singulair daily and Dulera prn  . Back pain    lumbar fracture  . Diverticulosis   . History of bronchitis    2014  . History of colon polyps   . History of kidney stones   . History of migraine    last one about 2wks ago  . Hyperlipidemia    takes Lovasatin daily  . Seasonal allergies    takes Claritin daily and Flonase daily    Patient Active Problem List   Diagnosis Date Noted  . Abortion, spontaneous 08/23/2016  . Compression fracture 08/23/2016  . Esophagitis, reflux 08/23/2016  . Hemorrhoid 08/23/2016  . Arthritis, degenerative 08/23/2016  . RAD (reactive airway disease) 08/23/2016  . Hypercholesterolemia without hypertriglyceridemia 08/23/2016  . Neuralgia neuritis, sciatic nerve 08/23/2016  . Avitaminosis D 08/23/2016  . Low  back pain 01/11/2010  . Herniation of nucleus pulposus 10/02/2009  . Osteopenia 10/02/2009    Past Surgical History:  Procedure Laterality Date  . ABDOMINAL HYSTERECTOMY  1985  . COLONOSCOPY    . CYSTOSCOPY    . FOOT SURGERY     bilateral  . JOINT REPLACEMENT Left 2009   knee  . LAMINECTOMY WITH POSTERIOR LATERAL ARTHRODESIS LEVEL 4 N/A 07/12/2013   Procedure: Thoracic Ten Through Lumbar Three posterior lateral fusion with instrumentation;  Surgeon: Winfield Cunas, MD;  Location: [place] [facility];  Service: Neurosurgery;  Laterality: N/A;  Thoracic Ten Through Lumbar Three posterior lateral fusion with instrumentation  . LITHOTRIPSY     x 2  . NASAL SINUS SURGERY     x 2    Prior to Admission medications   Medication Sig Start Date End Date Taking? Authorizing Provider  Butalbital-APAP-Caff-Cod 50-300-40-30 MG CAPS Take 1 tablet by mouth 4 (four) times daily as needed. For migraines. 08/23/16  Yes Dennis E Chrismon, PA  Calcium Carb-Cholecalciferol (CALTRATE 600+D3 SOFT PO) Take by mouth.   Yes Historical Provider, MD  MEGARED OMEGA-3 KRILL OIL PO Take by mouth.   Yes Historical Provider, MD  Multiple Vitamins-Minerals (WOMENS MULTIVITAMIN PO) Take by mouth.   Yes Historical Provider, MD  Turmeric 500 MG CAPS Take by mouth.   Yes Historical Provider, MD  cyclobenzaprine (FLEXERIL) 10 MG tablet Take 1 tablet (10 mg total)  by mouth 3 (three) times daily as needed for muscle spasms. Patient not taking: Reported on 08/29/2016 07/16/13   Ashok Pall, MD  oxyCODONE-acetaminophen (PERCOCET/ROXICET) 5-325 MG per tablet Take 1-2 tablets by mouth every 6 (six) hours as needed for pain. Patient not taking: Reported on 08/29/2016 07/16/13   Ashok Pall, MD    Allergies Adhesive [tape]; Aspirin; Hydrocodone; Ofloxacin; Oxycodone hcl; Simvastatin; Sulfa antibiotics; and Tramadol  No family history on file.  Social History Social History  Substance Use Topics  . Smoking status: Never Smoker  .  Smokeless tobacco: Not on file  . Alcohol use Yes     Comment: wine rarely    Review of Systems Constitutional: No fever/chills Eyes: No visual changes. ENT: No sore throat. Cardiovascular: Denies chest pain. Respiratory: Denies shortness of breath. Gastrointestinal:  abdominal pain, nausea, vomiting.  No diarrhea.  No constipation. Genitourinary: Negative for dysuria. Musculoskeletal: right flank pain Skin: Negative for rash. Neurological: Negative for headaches, focal weakness or numbness.  10-point ROS otherwise negative.  ____________________________________________   PHYSICAL EXAM:  VITAL SIGNS: ED Triage Vitals  Enc Vitals Group     BP 08/28/16 2207 (!) 104/57     Pulse Rate 08/28/16 2207 100     Resp 08/28/16 2207 20     Temp 08/28/16 2207 98.2 F (36.8 C)     Temp Source 08/28/16 2207 Oral     SpO2 08/28/16 2207 100 %     Weight --      Height --      Head Circumference --      Peak Flow --      Pain Score 08/28/16 2204 10     Pain Loc --      Pain Edu? --      Excl. in [place]? --     Constitutional: Alert and oriented. Well appearing and in moderate distress. Eyes: Conjunctivae are normal. PERRL. EOMI. Head: Atraumatic. Nose: No congestion/rhinnorhea. Mouth/Throat: Mucous membranes are moist.  Oropharynx non-erythematous. Cardiovascular: Normal rate, regular rhythm. Grossly normal heart sounds.  Good peripheral circulation. Respiratory: Normal respiratory effort.  No retractions. Lungs CTAB. Gastrointestinal: Soft and nontender. No distention. Positive bowel sounds left flank pain Musculoskeletal: No lower extremity tenderness nor edema.   Neurologic:  Normal speech and language.  Skin:  Skin is warm, dry and intact.  Psychiatric: Mood and affect are normal.   ____________________________________________   LABS (all labs ordered are listed, but only abnormal results are displayed)  Labs Reviewed  COMPREHENSIVE METABOLIC PANEL - Abnormal; Notable for  the following:       Result Value   Potassium 3.3 (*)    Glucose, Bld 151 (*)    BUN 21 (*)    Calcium 8.6 (*)    Total Protein 6.3 (*)    AST 120 (*)    ALT 65 (*)    All other components within normal limits  URINALYSIS COMPLETEWITH MICROSCOPIC ([facility] ONLY) - Abnormal; Notable for the following:    Color, Urine YELLOW (*)    APPearance HAZY (*)    Hgb urine dipstick 2+ (*)    Nitrite POSITIVE (*)    Leukocytes, UA 3+ (*)    Bacteria, UA MANY (*)    Squamous Epithelial / LPF 6-30 (*)    All other components within normal limits  URINE CULTURE  CULTURE, BLOOD (ROUTINE X 2)  CULTURE, BLOOD (ROUTINE X 2)  LIPASE, BLOOD  CBC   ____________________________________________  EKG  none ____________________________________________  RADIOLOGY  CT renal stone ____________________________________________   PROCEDURES  Procedure(s) performed: None  Procedures  Critical Care performed: No  ____________________________________________   INITIAL IMPRESSION / ASSESSMENT AND PLAN / ED COURSE  Pertinent labs & imaging results that were available during my care of the patient were reviewed by me and considered in my medical decision making (see chart for details).  This is a 65 year old female who comes into the hospital today with some right-sided flank pain that radiates around to her abdomen. The patient has had kidney stones but reports that this feels worse. The patient's blood work is unremarkable I will send her for a CT scan. She does appear to have a urinary tract infection with many bacteria and too numerous to count white blood cells. She also has positive nitrates. I will give the patient a dose of morphine as well as a dose of Zofran. She'll also receive a liter of normal saline and a dose of ceftriaxone.  Clinical Course as of Aug 29 125  Mon Aug 29, 2016  0033 6 mm stone in the distal right ureter with moderate proximal obstruction. 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 Large additional stone in the right renal pelvis. Multiple nonobstructing left intrarenal stones.   CT Renal Stone Study [AW]  0105 WBC, UA: TOO NUMEROUS TO COUNT [AW]    Clinical Course User Index [AW] Loney Hering, MD    The patient's CT scan shows a 6 mm stone in her distal right ureter. The patient currently has an infected kidney stone. I did contact Dr. Tresa Moore with urology who will come in and place a stent on the patient. Also discussed this with the patient. She'll receive a second dose of Zofran and she will be admitted to the urology service. ____________________________________________   FINAL CLINICAL IMPRESSION(S) / ED DIAGNOSES  Final diagnoses:  Kidney stone  Pyelonephritis      NEW MEDICATIONS STARTED DURING THIS VISIT:  New Prescriptions   No medications on file     Note:  This document was prepared using Dragon voice recognition software and may include unintentional dictation errors.    Loney Hering, MD 08/29/16 [phone]

## 2016-08-29 NOTE — Progress Notes (Signed)
Tylenol 100mg  given for temp of 102.2

## 2016-08-29 NOTE — Brief Op Note (Signed)
08/28/2016 - 08/29/2016  2:44 AM  PATIENT:  Monica Delacruz  65 y.o. female  PRE-OPERATIVE DIAGNOSIS:  right infected ureteral stone  POST-OPERATIVE DIAGNOSIS:  right infected ureteral stone  PROCEDURE:  Procedure(s): CYSTOSCOPY/RETROGRADE/stent placement (Right)  SURGEON:  Surgeon(s) and Role:    * Alexis Frock, MD - Primary  PHYSICIAN ASSISTANT:   ASSISTANTS: none   ANESTHESIA:   general  EBL:  No intake/output data recorded.  BLOOD ADMINISTERED:none  DRAINS: 84F foley to gravity   LOCAL MEDICATIONS USED:  NONE  SPECIMEN:  No Specimen  DISPOSITION OF SPECIMEN:  N/A  COUNTS:  YES  TOURNIQUET:  * No tourniquets in log *  DICTATION: .Other Dictation: Dictation Number [id number]  PLAN OF CARE: Admit to inpatient   PATIENT DISPOSITION:  PACU - hemodynamically stable.   Delay start of Pharmacological VTE agent (>24hrs) due to surgical blood loss or risk of bleeding: yes

## 2016-08-29 NOTE — Anesthesia Procedure Notes (Signed)
Procedure Name: Intubation Date/Time: 08/29/2016 2:29 AM Performed by: Nelda Marseille Pre-anesthesia Checklist: Patient identified, Patient being monitored, Timeout performed, Emergency Drugs available and Suction available Patient Re-evaluated:Patient Re-evaluated prior to inductionOxygen Delivery Method: Circle system utilized Preoxygenation: Pre-oxygenation with 100% oxygen Intubation Type: IV induction Ventilation: Mask ventilation without difficulty Laryngoscope Size: Mac and 3 Grade View: Grade I Tube type: Oral Tube size: 7.0 mm Number of attempts: 1 Airway Equipment and Method: Stylet Placement Confirmation: ETT inserted through vocal cords under direct vision,  positive ETCO2 and breath sounds checked- equal and bilateral Secured at: 21 cm Tube secured with: Tape Dental Injury: Teeth and Oropharynx as per pre-operative assessment

## 2016-08-29 NOTE — Progress Notes (Signed)
Subjective: S/p stent earlier today.  Fever to 102 early this AM.  Groggy.    Anti-infectives: Anti-infectives    Start     Dose/Rate Route Frequency Ordered Stop   08/29/16 1000  cefTRIAXone (ROCEPHIN) IVPB 1 g     1 g 100 mL/hr over 30 Minutes Intravenous Every 24 hours 08/29/16 0450     08/29/16 0415  cefTRIAXone (ROCEPHIN) 1 g in dextrose 5 % 50 mL IVPB  Status:  Discontinued     1 g 100 mL/hr over 30 Minutes Intravenous Every 24 hours 08/29/16 0405 08/29/16 0449   08/29/16 0045  cefTRIAXone (ROCEPHIN) 1 g in dextrose 5 % 50 mL IVPB  Status:  Discontinued     1 g 100 mL/hr over 30 Minutes Intravenous  Once 08/29/16 0034 08/29/16 0036   08/29/16 0045  cefTRIAXone (ROCEPHIN) IVPB 1 g     1 g 100 mL/hr over 30 Minutes Intravenous  Once 08/29/16 0036 08/29/16 0109      Current Facility-Administered Medications  Medication Dose Route Frequency Provider Last Rate Last Dose  . acetaminophen (TYLENOL) tablet 650 mg  650 mg Oral Q4H PRN Alexis Frock, MD      . albuterol (PROVENTIL) (2.5 MG/3ML) 0.083% nebulizer solution 3 mL  3 mL Inhalation Q4H PRN Alexis Frock, MD      . cefTRIAXone (ROCEPHIN) IVPB 1 g  1 g Intravenous Q24H Alexis Frock, MD      . cyclobenzaprine (FLEXERIL) tablet 10 mg  10 mg Oral TID PRN Alexis Frock, MD      . dextrose 5 % and 0.45 % NaCl with KCl 20 mEq/L infusion   Intravenous Continuous Alexis Frock, MD 125 mL/hr at 08/29/16 [phone]    . diphenhydrAMINE (BENADRYL) capsule 25 mg  25 mg Oral Q8H PRN Alexis Frock, MD   25 mg at 08/29/16 [phone]  . diphenhydrAMINE (BENADRYL) injection 25 mg  25 mg Intravenous Q8H PRN Alexis Frock, MD      . HYDROmorphone (DILAUDID) injection 0.5-1 mg  0.5-1 mg Intravenous Q2H PRN Alexis Frock, MD   1 mg at 08/29/16 0455  . morphine 2 MG/ML injection 4 mg  4 mg Intravenous Once Loney Hering, MD      . ondansetron [facility]) injection 4 mg  4 mg Intravenous Q4H PRN Alexis Frock, MD      . oxyCODONE (Oxy IR/ROXICODONE)  immediate release tablet 5 mg  5 mg Oral Q4H PRN Alexis Frock, MD      . Derrill Memo ON 08/30/2016] pneumococcal 23 valent vaccine (PNU-IMMUNE) injection 0.5 mL  0.5 mL Intramuscular Tomorrow-1000 Alexis Frock, MD      . senna-docusate (Senokot-S) tablet 1 tablet  1 tablet Oral BID Alexis Frock, MD      . sodium chloride 0.9 % bolus 500 mL  500 mL Intravenous Once Alexis Frock, MD         Objective: Vital signs in last 24 hours: Temp:  [98.2 F (36.8 C)-102.7 F (39.3 C)] 98.7 F (37.1 C) (11/06 0447) Pulse Rate:  [94-119] 106 (11/06 0447) Resp:  [12-34] 18 (11/06 0447) BP: (96-142)/(55-81) 114/64 (11/06 0447) SpO2:  [92 %-100 %] 97 % (11/06 0447)  Intake/Output from previous day: 11/05 0701 - 11/06 0700 In: 1000 [I.V.:1000] Out: 152 [Urine:150; Blood:2] Intake/Output this shift: No intake/output data recorded.   Physical Exam  Constitutional: She is oriented to person, place, and time and well-developed, well-nourished, and in no distress.  HENT:  Head: Normocephalic and atraumatic.  Pulmonary/Chest: Breath sounds  normal. She is in respiratory distress.  Abdominal: Soft.  Genitourinary:  Genitourinary Comments: No CVA tenderness  Neurological: She is alert and oriented to person, place, and time.  Skin: Skin is warm and dry.  Psychiatric: Affect normal.    Lab Results:   Recent Labs  08/28/16 2217 08/29/16 0639  WBC 4.2 20.1*  HGB 12.9 12.0  HCT 37.1 35.8  PLT 169 164   BMET  Recent Labs  08/28/16 2217 08/29/16 0639  NA 139 138  K 3.3* 3.9  CL 108 105  CO2 23 21*  GLUCOSE 151* 187*  BUN 21* 19  CREATININE 0.91 1.07*  CALCIUM 8.6* 8.3*   Studies/Results: Ct Renal Stone Study  Result Date: 08/29/2016 CLINICAL DATA:  Acute onset right flank pain tonight. EXAM: CT ABDOMEN AND PELVIS WITHOUT CONTRAST TECHNIQUE: Multidetector CT imaging of the abdomen and pelvis was performed following the standard protocol without IV contrast. COMPARISON:  10/23/2013  FINDINGS: Lower chest: Mild dependent changes in the lung bases. Small esophageal hiatal hernia. Hepatobiliary: Unenhanced appearance is unremarkable. Pancreas: Unenhanced appearance is unremarkable. Spleen: Unenhanced appearance is unremarkable. Adrenals/Urinary Tract: No adrenal gland nodules. Hydronephrosis and hydroureter on the right down to the level of the distal right ureter at the level of the sacrum where there is a 6 mm stone. Distal right ureter is decompressed. There is stranding around the right kidney and ureter with loss of distinction of the renal sinus fat. Suggestion of thickening of the wall of the ureter and pelvis. Gas within the renal collecting system and ureter. Findings suggest associated infection with gas-forming organism. Additional stone in the right renal pelvis measures 14 mm diameter. Multiple punctate stones in the left kidney, largest measuring up to about 4 mm diameter. No hydronephrosis or hydroureter on the left. No bladder wall thickening or bladder stone identified. Stomach/Bowel: Diverticula in the sigmoid colon. No evidence of diverticulitis. Stomach, small bowel, and colon are decompressed. Appendix is normal. Vascular/Lymphatic: Few scattered calcifications in the abdominal aorta. No aneurysm. No significant retroperitoneal lymphadenopathy. Reproductive: Status post hysterectomy. No adnexal masses. Other: No abdominal wall hernia or abnormality. No abdominopelvic ascites. Musculoskeletal: Postoperative changes with old compression fracture deformities at T12 and L1. Postoperative lumbosacral posterior fixation. Degenerative changes in the spine. IMPRESSION: 6 mm stone in the distal right ureter with moderate proximal obstruction. 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 Large additional stone in the right renal pelvis. Multiple nonobstructing left  intrarenal stones. Electronically Signed   By: Lucienne Capers M.D.   On: 08/29/2016 00:24     Assessment: 1 - Right Ureteral Stone - 30mm right distal ureteral stone (jsut below iliacs) with proximal moderate hydro and bilateral non-obstructing renal stones by ER CT [id number] on eval colickly right flank pain with emesis. Long h/o recurrent stones s/p SWL x 2 and ureteroscopy x 1 by [facility].   S/p R ureteral stent on 11/6 with Dr. Tresa Moore.  2 - Emphysematous Pyelitis - bacteruria, tachycardia, and gas in right collecting system c/w gas-forming GU infection proximal to stone above. No gas in renal parenchyma. No fevers or leukocytosis. She received rocephin in ER. [place] + [place] 11/6 pending.   Plan: -continue IV abx (ceftriaxone) -supportive care -f/u culture data -regular diet   LOS: 0 days    Hollice Espy 08/29/2016

## 2016-08-29 NOTE — Progress Notes (Signed)
Pt was resting comfortably. Spiritual Consult that Pt wanted info on Ad. CH provided AD and is available for follow up.   08/29/16 1010  Clinical Encounter Type  Visited With Patient and family together  Visit Type Initial  Referral From Nurse  Spiritual Encounters  Spiritual Needs Literature  Stress Factors  Patient Stress Factors None identified  Family Stress Factors None identified  Advance Directives (For Healthcare)  Does patient have an advance directive? No

## 2016-08-29 NOTE — Transfer of Care (Signed)
Immediate Anesthesia Transfer of Care Note  Patient: Monica Delacruz  Procedure(s) Performed: Procedure(s): CYSTOSCOPY/RETROGRADE/stent placement (Right)  Patient Location: PACU  Anesthesia Type:General  Level of Consciousness: sedated  Airway & Oxygen Therapy: Patient Spontanous Breathing and Patient connected to face mask oxygen  Post-op Assessment: Report given to RN and Post -op Vital signs reviewed and stable  Post vital signs: Reviewed and stable  Last Vitals:  Vitals:   08/29/16 0130 08/29/16 0140  BP: 99/74 (!) 96/55  Pulse: (!) 111 (!) 104  Resp: 12 14  Temp:      Last Pain:  Vitals:   08/29/16 0129  TempSrc:   PainSc: 4          Complications: No apparent anesthesia complications

## 2016-08-29 NOTE — Progress Notes (Signed)
Metoprolol 5mg  given for heart rate of 113

## 2016-08-29 NOTE — H&P (Signed)
Monica Delacruz is an 65 y.o. female.    Chief Complaint: Right Ureteral Stone with Emphysematous Pyelitis  HPI:    1 - Right Ureteral Stone - 27m right distal ureteral stone (jsut below iliacs) with proximal moderate hydro and bilateral non-obstructing renal stones by ER CT 140/3474on eval colickly right flank pain with emesis. Long h/o recurrent stones s/p SWL x 2 and ureteroscopy x 1 by [facility].   2 - Emphysematous Pyelitis - bacteruria, tachycardia, and gas in right collecting system c/w gas-forming GU infection proximal to stone above. No gas in renal parenchyma. No fevers or leukocytosis. She received rocephin in [place].   PMH sig fir sinus surgery, benign hyst, back surgery. No CV disease. No blood thinners. Non-diabetic.  Today "PTai is seen as urgent admission for above. Last meal 8 hours ago.   Past Medical History:  Diagnosis Date  . Asthma    takes Singulair daily and Dulera prn  . Back pain    lumbar fracture  . Diverticulosis   . History of bronchitis    2014  . History of colon polyps   . History of kidney stones   . History of migraine    last one about 2wks ago  . Hyperlipidemia    takes Lovasatin daily  . Seasonal allergies    takes Claritin daily and Flonase daily    Past Surgical History:  Procedure Laterality Date  . ABDOMINAL HYSTERECTOMY  1985  . COLONOSCOPY    . CYSTOSCOPY    . FOOT SURGERY     bilateral  . JOINT REPLACEMENT Left 2009   knee  . LAMINECTOMY WITH POSTERIOR LATERAL ARTHRODESIS LEVEL 4 N/A 07/12/2013   Procedure: Thoracic Ten Through Lumbar Three posterior lateral fusion with instrumentation;  Surgeon: KWinfield Cunas MD;  [facility]: [facility];  Service: Neurosurgery;  Laterality: N/A;  Thoracic Ten Through Lumbar Three posterior lateral fusion with instrumentation  . LITHOTRIPSY     x 2  . NASAL SINUS SURGERY     x 2    No family history on file. Social History:  reports that she has never smoked. She does  not have any smokeless tobacco history on file. She reports that she drinks alcohol. She reports that she does not use drugs.  Allergies:  Allergies  Allergen Reactions  . Adhesive [Tape] Other (See Comments)    Blisters skin  . Aspirin Other (See Comments)    Burning. Burns stomach  . Hydrocodone Itching  . Ofloxacin Nausea Only  . Oxycodone Hcl     Weird dreams  . Simvastatin     GI upset  . Sulfa Antibiotics Hives and Itching  . Tramadol Diarrhea, Nausea Only and Nausea And Vomiting     (Not in a hospital admission)  Results for orders placed or performed during the hospital encounter of 08/28/16 (from the past 48 hour(s))  Lipase, blood     Status: None   Collection Time: 08/28/16 10:17 PM  Result Value Ref Range   Lipase 33 11 - 51 U/L  Comprehensive metabolic panel     Status: Abnormal   Collection Time: 08/28/16 10:17 PM  Result Value Ref Range   Sodium 139 135 - 145 mmol/L   Potassium 3.3 (L) 3.5 - 5.1 mmol/L   Chloride 108 101 - 111 mmol/L   CO2 23 22 - 32 mmol/L   Glucose, Bld 151 (H) 65 - 99 mg/dL   BUN 21 (H) 6 -  20 mg/dL   Creatinine, Ser 0.91 0.44 - 1.00 mg/dL   Calcium 8.6 (L) 8.9 - 10.3 mg/dL   Total Protein 6.3 (L) 6.5 - 8.1 g/dL   Albumin 3.7 3.5 - 5.0 g/dL   AST 120 (H) 15 - 41 U/L   ALT 65 (H) 14 - 54 U/L   Alkaline Phosphatase 85 38 - 126 U/L   Total Bilirubin 0.7 0.3 - 1.2 mg/dL   GFR calc non Af Amer >60 >60 mL/min   GFR calc Af Amer >60 >60 mL/min    Comment: (NOTE) The eGFR has been calculated using the CKD EPI equation. This calculation has not been validated in all clinical situations. eGFR's persistently <60 mL/min signify possible Chronic Kidney Disease.    Anion gap 8 5 - 15  CBC     Status: None   Collection Time: 08/28/16 10:17 PM  Result Value Ref Range   WBC 4.2 3.6 - 11.0 K/uL   RBC 4.21 3.80 - 5.20 MIL/uL   Hemoglobin 12.9 12.0 - 16.0 g/dL   HCT 37.1 35.0 - 47.0 %   MCV 88.2 80.0 - 100.0 fL   MCH 30.6 26.0 - 34.0 pg    MCHC 34.7 32.0 - 36.0 g/dL   RDW 13.4 11.5 - 14.5 %   Platelets 169 150 - 440 K/uL  Urinalysis complete, with microscopic     Status: Abnormal   Collection Time: 08/28/16 10:17 PM  Result Value Ref Range   Color, Urine YELLOW (A) YELLOW   APPearance HAZY (A) CLEAR   Glucose, UA NEGATIVE NEGATIVE mg/dL   Bilirubin Urine NEGATIVE NEGATIVE   Ketones, ur NEGATIVE NEGATIVE mg/dL   Specific Gravity, Urine 1.013 1.005 - 1.030   Hgb urine dipstick 2+ (A) NEGATIVE   pH 5.0 5.0 - 8.0   Protein, ur NEGATIVE NEGATIVE mg/dL   Nitrite POSITIVE (A) NEGATIVE   Leukocytes, UA 3+ (A) NEGATIVE   RBC / HPF 6-30 0 - 5 RBC/hpf   WBC, UA TOO NUMEROUS TO COUNT 0 - 5 WBC/hpf   Bacteria, UA MANY (A) NONE SEEN   Squamous Epithelial / LPF 6-30 (A) NONE SEEN   Mucous PRESENT    Ct Renal Stone Study  Result Date: 08/29/2016 CLINICAL DATA:  Acute onset right flank pain tonight. EXAM: CT ABDOMEN AND PELVIS WITHOUT CONTRAST TECHNIQUE: Multidetector CT imaging of the abdomen and pelvis was performed following the standard protocol without IV contrast. COMPARISON:  10/23/2013 FINDINGS: Lower chest: Mild dependent changes in the lung bases. Small esophageal hiatal hernia. Hepatobiliary: Unenhanced appearance is unremarkable. Pancreas: Unenhanced appearance is unremarkable. Spleen: Unenhanced appearance is unremarkable. Adrenals/Urinary Tract: No adrenal gland nodules. Hydronephrosis and hydroureter on the right down to the level of the distal right ureter at the level of the sacrum where there is a 6 mm stone. Distal right ureter is decompressed. There is stranding around the right kidney and ureter with loss of distinction of the renal sinus fat. Suggestion of thickening of the wall of the ureter and pelvis. Gas within the renal collecting system and ureter. Findings suggest associated infection with gas-forming organism. Additional stone in the right renal pelvis measures 14 mm diameter. Multiple punctate stones in the left  kidney, largest measuring up to about 4 mm diameter. No hydronephrosis or hydroureter on the left. No bladder wall thickening or bladder stone identified. Stomach/Bowel: Diverticula in the sigmoid colon. No evidence of diverticulitis. Stomach, small bowel, and colon are decompressed. Appendix is normal. Vascular/Lymphatic: Few scattered calcifications in  the abdominal aorta. No aneurysm. No significant retroperitoneal lymphadenopathy. Reproductive: Status post hysterectomy. No adnexal masses. Other: No abdominal wall hernia or abnormality. No abdominopelvic ascites. Musculoskeletal: Postoperative changes with old compression fracture deformities at T12 and L1. Postoperative lumbosacral posterior fixation. Degenerative changes in the spine. IMPRESSION: 6 mm stone in the distal right ureter with moderate proximal obstruction. 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 Large additional stone in the right renal pelvis. Multiple nonobstructing left intrarenal stones. Electronically Signed   By: Lucienne Capers M.D.   On: 08/29/2016 00:24    Review of Systems  Constitutional: Positive for diaphoresis and malaise/fatigue.  Eyes: Negative.   Cardiovascular: Negative.   Gastrointestinal: Positive for nausea and vomiting.  Genitourinary: Positive for flank pain.  Musculoskeletal: Positive for back pain.  Skin: Negative.   Neurological: Negative.   Endo/Heme/Allergies: Negative.   Psychiatric/Behavioral: Negative.     Blood pressure (!) 96/55, pulse (!) 104, temperature 98.2 F (36.8 C), temperature source Oral, resp. rate 14, SpO2 96 %. Physical Exam  Constitutional: She is oriented to person, place, and time. She appears well-developed.  HENT:  Head: Normocephalic.  Eyes: Pupils are equal, round, and reactive to light.  Neck: Normal range of motion.  Cardiovascular:  Regular  tachycardia  Respiratory: Effort normal.  GI: Soft.  Genitourinary:  Genitourinary Comments: Mild Rt CVAT  Musculoskeletal: Normal range of motion.  Neurological: She is alert and oriented to person, place, and time.  Skin: Skin is warm.  Psychiatric: She has a normal mood and affect. Her behavior is normal. Judgment and thought content normal.     Assessment/Plan    1 - Right Ureteral Stone - discussed acute goals of renal decompression followed by clearance of infectious parameters then likely SWL versus ureteroscopy in several weeks for definitive stone management.   Options for renal decompression of stent v. neph tube discussed asnd we both agree on ureteral stent placement emergently.   Risks, benefits, expected peri-op course with admission post-op and remain on IV ABX until further CX data discussed.   2 - Emphysematous Pyelitis - IV ABX and treatment as per above. A1c today to r/o diabetes as this is very common in diabetics.   Alexis Frock, MD 08/29/2016, 2:03 AM

## 2016-08-29 NOTE — Anesthesia Postprocedure Evaluation (Signed)
Anesthesia Post Note  Patient: Monica Delacruz  Procedure(s) Performed: Procedure(s) (LRB): CYSTOSCOPY/RETROGRADE/stent placement (Right)  Patient location during evaluation: PACU Anesthesia Type: General Level of consciousness: awake and alert and oriented Pain management: pain level controlled Vital Signs Assessment: post-procedure vital signs reviewed and stable Respiratory status: spontaneous breathing Cardiovascular status: blood pressure returned to baseline Anesthetic complications: no    Last Vitals:  Vitals:   08/29/16 0130 08/29/16 0140  BP: 99/74 (!) 96/55  Pulse: (!) 111 (!) 104  Resp: 12 14  Temp:      Last Pain:  Vitals:   08/29/16 0129  TempSrc:   PainSc: 4                  Bandy Honaker

## 2016-08-29 NOTE — Progress Notes (Signed)
Pt states she is a 8 out of 10  She has been sleepy and snoring when not stimulated  States she feels like she needs to void  Blood pressure has been low in ER

## 2016-08-29 NOTE — Anesthesia Preprocedure Evaluation (Signed)
Anesthesia Evaluation  Patient identified by MRN, date of birth, ID band Patient awake    Reviewed: Allergy & Precautions, H&P , NPO status , Patient's Chart, lab work & pertinent test results  History of Anesthesia Complications (+) history of anesthetic complications  Airway Mallampati: I   Neck ROM: Full    Dental  (+) Chipped   Pulmonary asthma , COPD,  COPD inhaler,    breath sounds clear to auscultation       Cardiovascular negative cardio ROS Normal cardiovascular exam Rhythm:Regular Rate:Normal     Neuro/Psych  Headaches, sciatica  Neuromuscular disease    GI/Hepatic negative GI ROS, Neg liver ROS, Colon polyps   Endo/Other  negative endocrine ROS  Renal/GU negative Renal ROSstones  negative genitourinary   Musculoskeletal negative musculoskeletal ROS (+) Arthritis , Osteoarthritis,    Abdominal Normal abdominal exam  (+)   Peds negative pediatric ROS (+)  Hematology negative hematology ROS (+)   Anesthesia Other Findings   Reproductive/Obstetrics                             Anesthesia Physical  Anesthesia Plan  ASA: II and emergent  Anesthesia Plan: General   Post-op Pain Management:    Induction: Intravenous, Rapid sequence and Cricoid pressure planned  Airway Management Planned: Oral ETT  Additional Equipment:   Intra-op Plan:   Post-operative Plan: Extubation in OR  Informed Consent: I have reviewed the patients History and Physical, chart, labs and discussed the procedure including the risks, benefits and alternatives for the proposed anesthesia with the patient or authorized representative who has indicated his/her understanding and acceptance.   Dental advisory given  Plan Discussed with: CRNA and Surgeon  Anesthesia Plan Comments:         Anesthesia Quick Evaluation
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
## 2016-08-29 NOTE — Progress Notes (Signed)
Duo neb given for wheezing sound in throat  Dr Kayleen Memos in to see pt

## 2016-08-30 DIAGNOSIS — N200 Calculus of kidney: Secondary | ICD-10-CM

## 2016-08-30 DIAGNOSIS — N12 Tubulo-interstitial nephritis, not specified as acute or chronic: Secondary | ICD-10-CM

## 2016-08-30 LAB — BASIC METABOLIC PANEL
Anion gap: 5 (ref 5–15)
BUN: 14 mg/dL (ref 6–20)
CO2: 23 mmol/L (ref 22–32)
Calcium: 8.1 mg/dL — ABNORMAL LOW (ref 8.9–10.3)
Chloride: 111 mmol/L (ref 101–111)
Creatinine, Ser: 0.81 mg/dL (ref 0.44–1.00)
GFR calc Af Amer: >60 mL/min (ref >60)
GFR calc non Af Amer: >60 mL/min (ref >60)
Glucose, Bld: 171 mg/dL — ABNORMAL HIGH (ref 65–99)
Potassium: 4.7 mmol/L (ref 3.5–5.1)
Sodium: 139 mmol/L (ref 135–145)

## 2016-08-30 LAB — CBC
HCT: 33.8 % — ABNORMAL LOW (ref 35.0–47.0)
Hemoglobin: 11.2 g/dL — ABNORMAL LOW (ref 12.0–16.0)
MCH: 30.2 pg (ref 26.0–34.0)
MCHC: 33 g/dL (ref 32.0–36.0)
MCV: 91.3 fL (ref 80.0–100.0)
Platelets: 156 10*3/uL (ref 150–440)
RBC: 3.7 MIL/uL — ABNORMAL LOW (ref 3.80–5.20)
RDW: 14 % (ref 11.5–14.5)
WBC: 29.2 10*3/uL — ABNORMAL HIGH (ref 3.6–11.0)

## 2016-08-30 LAB — HEMOGLOBIN A1C
Hgb A1c MFr Bld: 5.7 % — ABNORMAL HIGH (ref 4.8–5.6)
Mean Plasma Glucose: 117 mg/dL

## 2016-08-30 MED ORDER — OXYCODONE HCL 5 MG PO TABS: 5.0000 mg | ORAL_TABLET | ORAL | 0 refills | Status: DC | PRN

## 2016-08-30 MED ORDER — ONDANSETRON 4 MG PO TBDP: 4.0000 mg | ORAL_TABLET | Freq: Three times a day (TID) | ORAL | 0 refills | Status: DC | PRN

## 2016-08-30 NOTE — Progress Notes (Signed)
Subjective: Patient's WBC up to 29.2 this am increased from 20.1.  At this time, afebrile VSS.  Blood cultures negative preliminary.  Urine culture is pending.  She is having mild right sided pain.    Anti-infectives: Anti-infectives    Start     Dose/Rate Route Frequency Ordered Stop   08/29/16 1000  cefTRIAXone (ROCEPHIN) IVPB 1 g     1 g 100 mL/hr over 30 Minutes Intravenous Every 24 hours 08/29/16 0450     08/29/16 0415  cefTRIAXone (ROCEPHIN) 1 g in dextrose 5 % 50 mL IVPB  Status:  Discontinued     1 g 100 mL/hr over 30 Minutes Intravenous Every 24 hours 08/29/16 0405 08/29/16 0449   08/29/16 0045  cefTRIAXone (ROCEPHIN) 1 g in dextrose 5 % 50 mL IVPB  Status:  Discontinued     1 g 100 mL/hr over 30 Minutes Intravenous  Once 08/29/16 0034 08/29/16 0036   08/29/16 0045  cefTRIAXone (ROCEPHIN) IVPB 1 g     1 g 100 mL/hr over 30 Minutes Intravenous  Once 08/29/16 0036 08/29/16 0109      Current Facility-Administered Medications  Medication Dose Route Frequency Provider Last Rate Last Dose  . acetaminophen (TYLENOL) tablet 650 mg  650 mg Oral Q4H PRN Alexis Frock, MD      . albuterol (PROVENTIL) (2.5 MG/3ML) 0.083% nebulizer solution 3 mL  3 mL Inhalation Q4H PRN Alexis Frock, MD      . cefTRIAXone (ROCEPHIN) IVPB 1 g  1 g Intravenous Q24H Alexis Frock, MD   1 g at 08/29/16 1053  . cyclobenzaprine (FLEXERIL) tablet 10 mg  10 mg Oral TID PRN Alexis Frock, MD      . dextrose 5 % and 0.45 % NaCl with KCl 20 mEq/L infusion   Intravenous Continuous Alexis Frock, MD 125 mL/hr at 08/30/16 0741 1,000 mL at 08/30/16 0741  . diphenhydrAMINE (BENADRYL) capsule 25 mg  25 mg Oral Q8H PRN Alexis Frock, MD   25 mg at 08/29/16 1657  . diphenhydrAMINE (BENADRYL) injection 25 mg  25 mg Intravenous Q8H PRN Alexis Frock, MD      . HYDROmorphone (DILAUDID) injection 0.5-1 mg  0.5-1 mg Intravenous Q2H PRN Alexis Frock, MD   1 mg at 08/29/16 0455  . morphine 2 MG/ML injection 4 mg  4 mg  Intravenous Once Loney Hering, MD      . ondansetron [facility]) injection 4 mg  4 mg Intravenous Q4H PRN Alexis Frock, MD      . oxyCODONE (Oxy IR/ROXICODONE) immediate release tablet 5 mg  5 mg Oral Q4H PRN Alexis Frock, MD   5 mg at 08/30/16 0305  . pneumococcal 23 valent vaccine (PNU-IMMUNE) injection 0.5 mL  0.5 mL Intramuscular Tomorrow-1000 Alexis Frock, MD      . senna-docusate (Senokot-S) tablet 1 tablet  1 tablet Oral BID Alexis Frock, MD   1 tablet at 08/29/16 2049  . sodium chloride 0.9 % bolus 500 mL  500 mL Intravenous Once Alexis Frock, MD         Objective: Vital signs in last 24 hours: Temp:  [98.1 F (36.7 C)-98.3 F (36.8 C)] 98.1 F (36.7 C) (11/07 0506) Pulse Rate:  [63-101] 75 (11/07 0629) Resp:  [16-19] 16 (11/07 0506) BP: (98-120)/(61-72) 105/61 (11/07 0506) SpO2:  [97 %-99 %] 98 % (11/07 0629) Weight:  [184 lb 12.8 oz (83.8 kg)] 184 lb 12.8 oz (83.8 kg) (11/07 0721)  Intake/Output from previous day: 11/06 0701 - 11/07 0700  In: 2053.6 [P.O.:560; I.V.:1493.6] Out: 3225 [Urine:3225] Intake/Output this shift: Total I/O In: 1256.4 [I.V.:1256.4] Out: -    Physical Exam  Constitutional: She is oriented to person, place, and time and well-developed, well-nourished, and in no distress.  HENT:  Head: Normocephalic and atraumatic.  Pulmonary/Chest: Breath sounds normal.  Abdominal: Soft.  Genitourinary:  Genitourinary Comments: No CVA tenderness  Neurological: She is alert and oriented to person, place, and time.  Skin: Skin is warm and dry.  Psychiatric: Affect normal.    Lab Results:   Recent Labs  08/29/16 0639 08/30/16 0432  WBC 20.1* 29.2*  HGB 12.0 11.2*  HCT 35.8 33.8*  PLT 164 156   BMET  Recent Labs  08/29/16 0639 08/30/16 0432  NA 138 139  K 3.9 4.7  CL 105 111  CO2 21* 23  GLUCOSE 187* 171*  BUN 19 14  CREATININE 1.07* 0.81  CALCIUM 8.3* 8.1*   Studies/Results: Ct Renal Stone Study  Result Date:  08/29/2016 CLINICAL DATA:  Acute onset right flank pain tonight. EXAM: CT ABDOMEN AND PELVIS WITHOUT CONTRAST TECHNIQUE: Multidetector CT imaging of the abdomen and pelvis was performed following the standard protocol without IV contrast. COMPARISON:  10/23/2013 FINDINGS: Lower chest: Mild dependent changes in the lung bases. Small esophageal hiatal hernia. Hepatobiliary: Unenhanced appearance is unremarkable. Pancreas: Unenhanced appearance is unremarkable. Spleen: Unenhanced appearance is unremarkable. Adrenals/Urinary Tract: No adrenal gland nodules. Hydronephrosis and hydroureter on the right down to the level of the distal right ureter at the level of the sacrum where there is a 6 mm stone. Distal right ureter is decompressed. There is stranding around the right kidney and ureter with loss of distinction of the renal sinus fat. Suggestion of thickening of the wall of the ureter and pelvis. Gas within the renal collecting system and ureter. Findings suggest associated infection with gas-forming organism. Additional stone in the right renal pelvis measures 14 mm diameter. Multiple punctate stones in the left kidney, largest measuring up to about 4 mm diameter. No hydronephrosis or hydroureter on the left. No bladder wall thickening or bladder stone identified. Stomach/Bowel: Diverticula in the sigmoid colon. No evidence of diverticulitis. Stomach, small bowel, and colon are decompressed. Appendix is normal. Vascular/Lymphatic: Few scattered calcifications in the abdominal aorta. No aneurysm. No significant retroperitoneal lymphadenopathy. Reproductive: Status post hysterectomy. No adnexal masses. Other: No abdominal wall hernia or abnormality. No abdominopelvic ascites. Musculoskeletal: Postoperative changes with old compression fracture deformities at T12 and L1. Postoperative lumbosacral posterior fixation. Degenerative changes in the spine. IMPRESSION: 6 mm stone in the distal right ureter with moderate  proximal obstruction. 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 Large additional stone in the right renal pelvis. Multiple nonobstructing left intrarenal stones. Electronically Signed   By: Lucienne Capers M.D.   On: 08/29/2016 00:24     Assessment: 1 - Right Ureteral Stone - 64mm right distal ureteral stone (just below iliacs) with proximal moderate hydro and bilateral non-obstructing renal stones by ER CT 123456 on eval colicky right flank pain with emesis. Long h/o recurrent stones s/p SWL x 2 and ureteroscopy x 1 by [facility].   S/p R ureteral stent on 11/6 with Dr. Tresa Moore.  2 - Emphysematous Pyelitis - bacteruria, tachycardia, and gas in right collecting system c/w gas-forming GU infection proximal to stone above. No gas in renal parenchyma. No fevers.  Now having leukocytosis. She is receiving rocephin.  [place] [facility] [place] 11/6 pending.   Plan: -continue IV abx (ceftriaxone) -supportive care -f/u culture data -regular diet - scripts for oxycodone IR and Zofran in chart for when patient is discharged   LOS: 1 day    [facility] 08/30/2016

## 2016-08-31 ENCOUNTER — Telehealth: Payer: Self-pay

## 2016-08-31 LAB — CBC
HCT: 32.3 % — ABNORMAL LOW (ref 35.0–47.0)
Hemoglobin: 10.9 g/dL — ABNORMAL LOW (ref 12.0–16.0)
MCH: 30.1 pg (ref 26.0–34.0)
MCHC: 33.7 g/dL (ref 32.0–36.0)
MCV: 89.3 fL (ref 80.0–100.0)
Platelets: 149 10*3/uL — ABNORMAL LOW (ref 150–440)
RBC: 3.62 MIL/uL — ABNORMAL LOW (ref 3.80–5.20)
RDW: 13.8 % (ref 11.5–14.5)
WBC: 19.7 10*3/uL — ABNORMAL HIGH (ref 3.6–11.0)

## 2016-08-31 LAB — BASIC METABOLIC PANEL
Anion gap: 5 (ref 5–15)
BUN: 9 mg/dL (ref 6–20)
CO2: 24 mmol/L (ref 22–32)
Calcium: 7.6 mg/dL — ABNORMAL LOW (ref 8.9–10.3)
Chloride: 111 mmol/L (ref 101–111)
Creatinine, Ser: 0.87 mg/dL (ref 0.44–1.00)
GFR calc Af Amer: >60 mL/min (ref >60)
GFR calc non Af Amer: >60 mL/min (ref >60)
Glucose, Bld: 120 mg/dL — ABNORMAL HIGH (ref 65–99)
Potassium: 4.1 mmol/L (ref 3.5–5.1)
Sodium: 140 mmol/L (ref 135–145)

## 2016-08-31 LAB — URINE CULTURE: Culture: >=100000 — AB

## 2016-08-31 MED ORDER — CEPHALEXIN 500 MG PO CAPS: 500.0000 mg | ORAL_CAPSULE | Freq: Four times a day (QID) | ORAL | 0 refills | Status: DC

## 2016-08-31 NOTE — Progress Notes (Signed)
Discharge instructions reviewed with the patient and her husband. RX given to the patient for new meds.  Patient sent out via wheelchair to husbands waiting car

## 2016-08-31 NOTE — Telephone Encounter (Signed)
Done

## 2016-08-31 NOTE — Discharge Summary (Signed)
Date of admission: 08/28/2016  Date of discharge: 08/31/2016  Admission diagnosis: Right ureteral stone, sepsis, emphysematous pyelitis  Discharge diagnosis: Same  Secondary diagnoses:  Patient Active Problem List   Diagnosis Date Noted  . Right ureteral stone 08/29/2016  . Abortion, spontaneous 08/23/2016  . Compression fracture 08/23/2016  . Esophagitis, reflux 08/23/2016  . Hemorrhoid 08/23/2016  . Arthritis, degenerative 08/23/2016  . RAD (reactive airway disease) 08/23/2016  . Hypercholesterolemia without hypertriglyceridemia 08/23/2016  . Neuralgia neuritis, sciatic nerve 08/23/2016  . Avitaminosis D 08/23/2016  . Low back pain 01/11/2010  . Herniation of nucleus pulposus 10/02/2009  . Osteopenia 10/02/2009    History and Physical: For full details, please see admission history and physical. Briefly, Monica Delacruz is a 65 y.o. year old patient with right septic stone and emphysematous pyelitis s/p emergent right ureteral stent placement.   Hospital Course: Patient tolerated the procedure well.  She was then transferred to the floor after an uneventful PACU stay.  Her hospital course was uncomplicated.  On POD#2 she had met discharge criteria: was eating a regular diet, was up and ambulating independently,  pain was well controlled, was voiding without a catheter, and was ready to for discharge.   Laboratory values:   Recent Labs  08/29/16 0639 08/30/16 0432 08/31/16 0417  WBC 20.1* 29.2* 19.7*  HGB 12.0 11.2* 10.9*  HCT 35.8 33.8* 32.3*    Recent Labs  08/29/16 0639 08/30/16 0432 08/31/16 0417  NA 138 139 140  K 3.9 4.7 4.1  CL 105 111 111  CO2 21* 23 24  GLUCOSE 187* 171* 120*  BUN _0 CREATININE 1.07* 0.81 0.87  CALCIUM 8.3* 8.1* 7.6*   No results for input(s): LABPT, INR in the last 72 hours. No results for input(s): LABURIN in the last 72 hours. Results for orders placed or performed during the hospital encounter of 08/28/16  Urine culture      Status: Abnormal   Collection Time: 08/28/16 10:17 PM  Result Value Ref Range Status   Specimen Description URINE, RANDOM  Final   Special Requests NONE  Final   Culture >=100,000 COLONIES/mL ESCHERICHIA COLI (A)  Final   Report Status 08/31/2016 FINAL  Final   Organism ID, Bacteria ESCHERICHIA COLI (A)  Final      Susceptibility   Escherichia coli - MIC*    AMPICILLIN <=2 SENSITIVE Sensitive     CEFAZOLIN <=4 SENSITIVE Sensitive     CEFTRIAXONE <=1 SENSITIVE Sensitive     CIPROFLOXACIN <=0.25 SENSITIVE Sensitive     GENTAMICIN <=1 SENSITIVE Sensitive     IMIPENEM <=0.25 SENSITIVE Sensitive     NITROFURANTOIN <=16 SENSITIVE Sensitive     TRIMETH/SULFA <=20 SENSITIVE Sensitive     AMPICILLIN/SULBACTAM <=2 SENSITIVE Sensitive     PIP/TAZO <=4 SENSITIVE Sensitive     Extended ESBL NEGATIVE Sensitive     * >=100,000 COLONIES/mL ESCHERICHIA COLI  Blood culture (routine x 2)     Status: None (Preliminary result)   Collection Time: 08/29/16  1:29 AM  Result Value Ref Range Status   Specimen Description BLOOD LEFT FOREARM  Final   Special Requests BOTTLES DRAWN AEROBIC AND ANAEROBIC 12CC  Final   Culture NO GROWTH 2 DAYS  Final   Report Status PENDING  Incomplete  Blood culture (routine x 2)     Status: None (Preliminary result)   Collection Time: 08/29/16  1:29 AM  Result Value Ref Range Status   Specimen Description BLOOD LEFT HAND  Final   Special Requests BOTTLES DRAWN AEROBIC AND ANAEROBIC 12CC  Final   Culture NO GROWTH 2 DAYS  Final   Report Status PENDING  Incomplete    Disposition: Home  Discharge instruction: The patient was instructed to be ambulatory but told to refrain from heavy lifting, strenuous activity, or driving.     Followup: 1-2 weeks to discuss definitive stone mangement

## 2016-08-31 NOTE — Telephone Encounter (Signed)
-----   Message from Nickie Retort, MD sent at 08/31/2016  8:02 AM EST ----- Patient needs to be seen by any provider to discuss next surgery in 1-2 weeks

## 2016-09-03 LAB — CULTURE, BLOOD (ROUTINE X 2)
Culture: NO GROWTH
Culture: NO GROWTH

## 2016-09-05 ENCOUNTER — Telehealth: Payer: Self-pay

## 2016-09-05 NOTE — Telephone Encounter (Signed)
Called patient to help her understand this was a BLOOD culture report not a URINE culture report. She has been on antibiotics and followed by urologist to see if lithotripsy or surgical retrieval of the kidney stone will be necessary. She will continue the antibiotics she is on and follow up with the urologist as planned.

## 2016-09-05 NOTE — Telephone Encounter (Signed)
-----   Message from Margo Common, [place] sent at 09/05/2016  7:40 AM EST ----- Blood cultures negative for infection in blood. Keep follow up appointment with urologist as planned.

## 2016-09-05 NOTE — Telephone Encounter (Signed)
Patient was advised and very upset on the phone stating that she did have an infection in her blood. Patient states she was hospitalized over weekend at [facility] and was told she had an infection on her blood along with a blocked kidney. Please review. KW

## 2016-09-08 ENCOUNTER — Telehealth: Payer: Self-pay | Admitting: Urology

## 2016-09-08 NOTE — Telephone Encounter (Signed)
Pt states still having pain and spasms that is now radiating to her bottom and down her legs. Stringy clots. Pt asking for Rx to help spasms.  Pt has questions about reasons for pain. Pt is trying to work and unable to take pain meds while at work. Please advise.

## 2016-09-08 NOTE — Telephone Encounter (Signed)
Can you please address this message since you are the only [facility] provider here ?  Thanks,  michelle

## 2016-09-08 NOTE — Telephone Encounter (Signed)
I spoke w/ the pt and informed her that she could pick up samples of Myrbetriq 25MG . She said her husband will stop by tomorrow and pick them up.

## 2016-09-13 ENCOUNTER — Telehealth: Payer: Self-pay | Admitting: Urology

## 2016-09-13 DIAGNOSIS — B379 Candidiasis, unspecified: Secondary | ICD-10-CM

## 2016-09-13 MED ORDER — FLUCONAZOLE 150 MG PO TABS: 150.0000 mg | ORAL_TABLET | Freq: Once | ORAL | 0 refills | Status: AC

## 2016-09-13 NOTE — Telephone Encounter (Signed)
Per Larene Beach diflucan 150mg  once was sent to her pharmacy.

## 2016-09-13 NOTE — Telephone Encounter (Signed)
Dr. Pilar Jarvis gave her an ABX and now she has a yeast infection, she would like diflucan called into [facility] in [place] please.  Thanks,  Sharyn Lull

## 2016-09-22 ENCOUNTER — Ambulatory Visit (INDEPENDENT_AMBULATORY_CARE_PROVIDER_SITE_OTHER): Payer: BLUE CROSS/BLUE SHIELD | Admitting: Urology

## 2016-09-22 ENCOUNTER — Encounter: Payer: Self-pay | Admitting: Urology

## 2016-09-22 ENCOUNTER — Telehealth: Payer: Self-pay | Admitting: Radiology

## 2016-09-22 ENCOUNTER — Other Ambulatory Visit: Payer: Self-pay | Admitting: Radiology

## 2016-09-22 VITALS — BP 131/80 | HR 82 | Ht 65.0 in | Wt 171.0 lb

## 2016-09-22 DIAGNOSIS — N200 Calculus of kidney: Secondary | ICD-10-CM

## 2016-09-22 DIAGNOSIS — N201 Calculus of ureter: Secondary | ICD-10-CM

## 2016-09-22 LAB — URINALYSIS, COMPLETE
Bilirubin, UA: NEGATIVE
Glucose, UA: NEGATIVE
Ketones, UA: NEGATIVE
Nitrite, UA: POSITIVE — AB
Specific Gravity, UA: 1.025 (ref 1.005–1.030)
Urobilinogen, Ur: 0.2 mg/dL (ref 0.2–1.0)
pH, UA: 6 (ref 5.0–7.5)

## 2016-09-22 LAB — MICROSCOPIC EXAMINATION
Epithelial Cells (non renal): >10 /hpf — AB (ref 0–10)
RBC, UA: >30 /hpf — AB (ref 0–?)
WBC, UA: >30 /hpf — AB (ref 0–?)

## 2016-09-22 NOTE — Telephone Encounter (Signed)
Notified pt of surgery scheduled with Dr Erlene Quan on 09/27/16, pre-admit testinga ppt on 09/23/16 @2 :30 & to call day prior to surgery for arrival time to [facility]. Pt voices understanding.

## 2016-09-22 NOTE — Progress Notes (Signed)
09/22/2016 9:24 AM   Monica Delacruz 1951/01/14 [id number]  Referring provider: Margo Common, [place], [place] [address]  Chief Complaint  Patient presents with  . Nephrolithiasis    2 wk follow up    HPI: The patient is a 65 year old female who underwent an emergent right ureteral stent placement for 7 mm right distal ureteral stone with proximal hydronephrosis and emphysematous pyelitis returns today to discuss definitive stone management.    She also has a 14 mm stone in her right renal pelvis as well smaller calcifications within her left kidney. She has done well since surgery. She does have occasional nausea but no other symptoms. She denies dysuria or flank pain.  The patient has had recurrent nephrolithiasis with ureteroscopy and lithotripsy in the past. She has 24-hour urine study which per the patient only showed elevated vitamin C with no other correctable factors.    PMH: Past Medical History:  Diagnosis Date  . Asthma    takes Singulair daily and Dulera prn  . Back pain    lumbar fracture  . Diverticulosis   . History of bronchitis    2014  . History of colon polyps   . History of kidney stones   . History of migraine    last one about 2wks ago  . Hyperlipidemia    takes Lovasatin daily  . Seasonal allergies    takes Claritin daily and Flonase daily    Surgical History: Past Surgical History:  Procedure Laterality Date  . ABDOMINAL HYSTERECTOMY  1985  . COLONOSCOPY    . CYSTOSCOPY    . CYSTOSCOPY/RETROGRADE/URETEROSCOPY Right 08/29/2016   Procedure: CYSTOSCOPY/RETROGRADE/stent placement;  Surgeon: Alexis Frock, MD;  Location: [facility] ORS;  Service: Urology;  Laterality: Right;  . FOOT SURGERY     bilateral  . JOINT REPLACEMENT Left 2009   knee  . LAMINECTOMY WITH POSTERIOR LATERAL ARTHRODESIS LEVEL 4 N/A 07/12/2013   Procedure: Thoracic Ten Through Lumbar Three posterior lateral fusion with instrumentation;  Surgeon: Winfield Cunas, MD;  Location: [facility];  Service: Neurosurgery;  Laterality: N/A;  Thoracic Ten Through Lumbar Three posterior lateral fusion with instrumentation  . LITHOTRIPSY     x 2  . NASAL SINUS SURGERY     x 2    Home Medications:    Medication List       Accurate as of 09/22/16  9:24 AM. Always use your most recent med list.          Butalbital-APAP-Caff-Cod 50-300-40-30 MG Caps Take 1 tablet by mouth 4 (four) times daily as needed. For migraines.   CALTRATE 600+D3 SOFT PO Take by mouth.   MEGARED OMEGA-3 KRILL OIL PO Take by mouth.   ondansetron 4 MG disintegrating tablet Commonly known as:  ZOFRAN ODT Take 1 tablet (4 mg total) by mouth every 8 (eight) hours as needed for nausea or vomiting.   oxyCODONE 5 MG immediate release tablet Commonly known as:  Oxy IR/ROXICODONE Take 1 tablet (5 mg total) by mouth every 4 (four) hours as needed for moderate pain.   Turmeric 500 MG Caps Take by mouth.   WOMENS MULTIVITAMIN PO Take by mouth.       Allergies:  Allergies  Allergen Reactions  . Adhesive [Tape] Other (See Comments)    Blisters skin  . Aspirin Other (See Comments)    Burning. Burns stomach  . Hydrocodone Itching  . Ofloxacin Nausea Only  . Oxycodone Hcl  Weird dreams  . Simvastatin     GI upset  . Sulfa Antibiotics Hives and Itching  . Tramadol Diarrhea, Nausea Only and Nausea And Vomiting    Family History: Family History  Problem Relation Age of Onset  . Hematuria Mother     Social History:  reports that she has never smoked. She has never used smokeless tobacco. She reports that she drinks alcohol. She reports that she does not use drugs.  ROS: UROLOGY Frequent Urination?: Yes Hard to postpone urination?: No Burning/pain with urination?: No Get up at night to urinate?: Yes Leakage of urine?: Yes Urine stream starts and stops?: No Trouble starting stream?: No Do you have to strain to urinate?: No Blood in urine?:  Yes Urinary tract infection?: Yes Sexually transmitted disease?: No Injury to kidneys or bladder?: No Painful intercourse?: No Weak stream?: No Currently pregnant?: No Vaginal bleeding?: No Last menstrual period?: N  Gastrointestinal Nausea?: No Vomiting?: No Indigestion/heartburn?: No Diarrhea?: No Constipation?: No  Constitutional Fever: No Night sweats?: No Weight loss?: No Fatigue?: No  Skin Skin rash/lesions?: No Itching?: No  Eyes Blurred vision?: No Double vision?: No  Ears/Nose/Throat Sore throat?: No Sinus problems?: No  Hematologic/Lymphatic Swollen glands?: No Easy bruising?: No  Cardiovascular Leg swelling?: No Chest pain?: No  Respiratory Cough?: No Shortness of breath?: No  Endocrine Excessive thirst?: No  Musculoskeletal Back pain?: No Joint pain?: No  Neurological Headaches?: No Dizziness?: No  Psychologic Depression?: No Anxiety?: No  Physical Exam: BP 131/80   Pulse 82   Ht '5\' 5"'  (1.651 m)   Wt 171 lb (77.6 kg)   BMI 28.46 kg/m   Constitutional:  Alert and oriented, No acute distress. HEENT: [place] AT, moist mucus membranes.  Trachea midline, no masses. Cardiovascular: No clubbing, cyanosis, or edema. Respiratory: Normal respiratory effort, no increased work of breathing. GI: Abdomen is soft, nontender, nondistended, no abdominal masses GU: No CVA tenderness.  Skin: No rashes, bruises or suspicious lesions. Lymph: No cervical or inguinal adenopathy. Neurologic: Grossly intact, no focal deficits, moving all 4 extremities. Psychiatric: Normal mood and affect.  Laboratory Data: Lab Results  Component Value Date   WBC 19.7 (H) 08/31/2016   HGB 10.9 (L) 08/31/2016   HCT 32.3 (L) 08/31/2016   MCV 89.3 08/31/2016   PLT 149 (L) 08/31/2016    Lab Results  Component Value Date   CREATININE 0.87 08/31/2016    No results found for: PSA  No results found for: TESTOSTERONE  Lab Results  Component Value Date   HGBA1C  5.7 (H) 08/28/2016    Urinalysis    Component Value Date/Time   COLORURINE YELLOW (A) 08/28/2016 2217   APPEARANCEUR HAZY (A) 08/28/2016 2217   LABSPEC 1.013 08/28/2016 2217   PHURINE 5.0 08/28/2016 2217   GLUCOSEU NEGATIVE 08/28/2016 2217   HGBUR 2+ (A) 08/28/2016 2217   [place] NEGATIVE 08/28/2016 2217   [place] NEGATIVE 08/28/2016 2217   PROTEINUR NEGATIVE 08/28/2016 2217   NITRITE POSITIVE (A) 08/28/2016 2217   LEUKOCYTESUR 3+ (A) 08/28/2016 2217      Assessment & Plan:    1. Right nephrolithiasis I discussed with the patient the treatment options for her stones now that her infection has cleared. Given that she has a fairly large right renal stone as well as her obstructing right ureteral stone, I think her best option would be right ureteroscopy to give her the best chance of being stone free with one surgery. We discussed this procedure in great detail. She has  been through it before. She understands the risks include but are not much to bleeding, infection, iatrogenic injury, need for repeat procedures, need to replace ureteral stent temporarily after the procedure. All questions were answered. The patient is agreeable to proceeding. She would like to be scheduled as soon as possible as she has met her deductible for the year.     Nickie Retort, MD  [facility] [address], [place],  [address] [phone]

## 2016-09-23 ENCOUNTER — Encounter
Admission: RE | Admit: 2016-09-23 | Discharge: 2016-09-23 | Disposition: A | Discharge status: Home / Self Care | Payer: BLUE CROSS/BLUE SHIELD | Source: Ambulatory Visit | Attending: Urology | Admitting: Urology

## 2016-09-23 DIAGNOSIS — Z01818 Encounter for other preprocedural examination: Secondary | ICD-10-CM | POA: Insufficient documentation

## 2016-09-23 HISTORY — DX: Headache: R51

## 2016-09-23 HISTORY — DX: Headache, unspecified: R51.9

## 2016-09-23 NOTE — Patient Instructions (Signed)
  Your procedure is scheduled on: September 27, 2016 (Tuesday) Report to Same Day Surgery 2nd floor medical mall [facility] Entrance-take elevator on left to 2nd floor.  Check in with surgery information desk.) To find out your arrival time please call [phone] between 1PM - 3PM on September 26, 2016 (Monday)  Remember: Instructions that are not followed completely may result in serious medical risk, up to and including death, or upon the discretion of your surgeon and anesthesiologist your surgery may need to be rescheduled.    _x___ 1. Do not eat food or drink liquids after midnight. No gum chewing or hard candies.     __x__ 2. No Alcohol for 24 hours before or after surgery.   __x__3. No Smoking for 24 prior to surgery.   ____  4. Bring all medications with you on the day of surgery if instructed.    __x__ 5. Notify your doctor if there is any change in your medical condition     (cold, fever, infections).     Do not wear jewelry, make-up, hairpins, clips or nail polish.  Do not wear lotions, powders, or perfumes. You may wear deodorant.  Do not shave 48 hours prior to surgery. Men may shave face and neck.  Do not bring valuables to the hospital.    [facility] is not responsible for any belongings or valuables.               Contacts, dentures or bridgework may not be worn into surgery.  Leave your suitcase in the car. After surgery it may be brought to your room.  For patients admitted to the hospital, discharge time is determined by your treatment team.   Patients discharged the day of surgery will not be allowed to drive home.  You will need someone to drive you home and stay with you the night of your procedure.    Please read over the following fact sheets that you were given:   [facility] Preparing for Surgery and or MRSA Information   __ Take these medicines the morning of surgery with A SIP OF WATER:    1.   2.  3.  4.  5.  6.  ____Fleets enema or Magnesium  Citrate as directed.   __ Use CHG Soap or sage wipes as directed on instruction sheet   ____ Use inhalers on the day of surgery and bring to hospital day of surgery  ____ Stop metformin 2 days prior to surgery    ____ Take 1/2 of usual insulin dose the night before surgery and none on the morning of           surgery.   __x__ Stop Aspirin, Coumadin, Pllavix ,Eliquis, Effient, or Pradaxa (NO ASPIRIN)  x__ Stop Anti-inflammatories such as Advil, Aleve, Ibuprofen, Motrin, Naproxen,          Naprosyn, Goodies powders or aspirin products. Ok to take Tylenol.   _x___ Stop supplements until after surgery.  (PATIENT HAS STOPPED ALL SUPPLEMENTS)  ____ Bring C-Pap to the hospital.

## 2016-09-25 LAB — CULTURE, URINE COMPREHENSIVE

## 2016-09-27 ENCOUNTER — Encounter: Payer: Self-pay | Admitting: *Deleted

## 2016-09-27 ENCOUNTER — Encounter
Admission: RE | Disposition: A | Discharge status: Home / Self Care | Payer: Self-pay | Source: Ambulatory Visit | Attending: Urology

## 2016-09-27 ENCOUNTER — Telehealth: Payer: Self-pay | Admitting: Radiology

## 2016-09-27 ENCOUNTER — Ambulatory Visit: Payer: BLUE CROSS/BLUE SHIELD | Admitting: Anesthesiology

## 2016-09-27 ENCOUNTER — Other Ambulatory Visit: Payer: Self-pay | Admitting: Radiology

## 2016-09-27 ENCOUNTER — Ambulatory Visit
Admission: RE | Admit: 2016-09-27 | Discharge: 2016-09-27 | Disposition: A | Discharge status: Home / Self Care | Payer: BLUE CROSS/BLUE SHIELD | Source: Ambulatory Visit | Attending: Urology | Admitting: Urology

## 2016-09-27 DIAGNOSIS — Z5309 Procedure and treatment not carried out because of other contraindication: Secondary | ICD-10-CM | POA: Diagnosis not present

## 2016-09-27 DIAGNOSIS — R8279 Other abnormal findings on microbiological examination of urine: Secondary | ICD-10-CM | POA: Insufficient documentation

## 2016-09-27 DIAGNOSIS — Z79899 Other long term (current) drug therapy: Secondary | ICD-10-CM | POA: Diagnosis not present

## 2016-09-27 DIAGNOSIS — N201 Calculus of ureter: Secondary | ICD-10-CM

## 2016-09-27 DIAGNOSIS — Z792 Long term (current) use of antibiotics: Secondary | ICD-10-CM | POA: Insufficient documentation

## 2016-09-27 SURGERY — CYSTOSCOPY, FLEXIBLE, WITH STENT REPLACEMENT
Anesthesia: General | Laterality: Right

## 2016-09-27 MED ORDER — SODIUM CHLORIDE 0.9 % IV SOLN
INTRAVENOUS | Status: AC
  Filled 2016-09-27: qty 2000

## 2016-09-27 MED ORDER — AMOXICILLIN-POT CLAVULANATE 875-125 MG PO TABS: 1.0000 | ORAL_TABLET | Freq: Two times a day (BID) | ORAL | 0 refills | Status: DC

## 2016-09-27 MED ORDER — GENTAMICIN SULFATE 40 MG/ML IJ SOLN
120.0000 mg | INTRAVENOUS | Status: DC
  Filled 2016-09-27: qty 3

## 2016-09-27 MED ORDER — LACTATED RINGERS IV SOLN
INTRAVENOUS | Status: DC
  Administered 2016-09-27: 09:00:00 via INTRAVENOUS

## 2016-09-27 MED ORDER — SODIUM CHLORIDE 0.9 % IV SOLN: 2.0000 g | INTRAVENOUS | Status: DC

## 2016-09-27 MED ORDER — FAMOTIDINE 20 MG PO TABS
ORAL_TABLET | ORAL | Status: AC
  Filled 2016-09-27: qty 1

## 2016-09-27 MED ORDER — FAMOTIDINE 20 MG PO TABS
20.0000 mg | ORAL_TABLET | Freq: Once | ORAL | Status: AC
  Administered 2016-09-27: 20 mg via ORAL

## 2016-09-27 SURGICAL SUPPLY — 31 items
BAG DRAIN CYSTO-URO LG1000N (MISCELLANEOUS) ×2 IMPLANT
BASKET ZERO TIP 1.9FR (BASKET) IMPLANT
CATH URETL 5X70 OPEN END (CATHETERS) ×2 IMPLANT
CNTNR SPEC 2.5X3XGRAD LEK (MISCELLANEOUS) ×1
CONRAY 43 FOR UROLOGY 50M (MISCELLANEOUS) ×2 IMPLANT
CONT SPEC 4OZ STER OR WHT (MISCELLANEOUS) ×1
CONTAINER SPEC 2.5X3XGRAD LEK (MISCELLANEOUS) ×1 IMPLANT
DRAPE UTILITY 15X26 TOWEL STRL (DRAPES) ×2 IMPLANT
FIBER LASER LITHO 273 (Laser) IMPLANT
GLOVE BIO SURGEON STRL SZ 6.5 (GLOVE) ×2 IMPLANT
GOWN STRL REUS W/ TWL LRG LVL3 (GOWN DISPOSABLE) ×2 IMPLANT
GOWN STRL REUS W/ TWL LRG LVL4 (GOWN DISPOSABLE) ×2 IMPLANT
GOWN STRL REUS W/TWL LRG LVL3 (GOWN DISPOSABLE) ×2
GOWN STRL REUS W/TWL LRG LVL4 (GOWN DISPOSABLE) ×2
GUIDEWIRE SUPER STIFF (WIRE) IMPLANT
INFUSOR MANOMETER BAG 3000ML (MISCELLANEOUS) ×2 IMPLANT
INTRODUCER DILATOR DOUBLE (INTRODUCER) IMPLANT
KIT RM TURNOVER CYSTO AR (KITS) ×2 IMPLANT
PACK CYSTO AR (MISCELLANEOUS) ×2 IMPLANT
SCRUB POVIDONE IODINE 4 OZ (MISCELLANEOUS) IMPLANT
SENSORWIRE 0.038 NOT ANGLED (WIRE) ×2
SET CYSTO W/LG BORE CLAMP LF (SET/KITS/TRAYS/PACK) ×2 IMPLANT
SHEATH URETERAL 12FRX35CM (MISCELLANEOUS) IMPLANT
SOL .9 NS 3000ML IRR  AL (IV SOLUTION) ×1
SOL .9 NS 3000ML IRR UROMATIC (IV SOLUTION) ×1 IMPLANT
STENT URET 6FRX24 CONTOUR (STENTS) IMPLANT
STENT URET 6FRX26 CONTOUR (STENTS) IMPLANT
SURGILUBE 2OZ TUBE FLIPTOP (MISCELLANEOUS) ×2 IMPLANT
SYRINGE IRR TOOMEY STRL 70CC (SYRINGE) ×2 IMPLANT
WATER STERILE IRR 1000ML POUR (IV SOLUTION) ×2 IMPLANT
WIRE SENSOR 0.038 NOT ANGLED (WIRE) ×1 IMPLANT

## 2016-09-27 NOTE — Anesthesia Preprocedure Evaluation (Deleted)
Anesthesia Evaluation  Patient identified by MRN, date of birth, ID band Patient awake    Reviewed: Allergy & Precautions, NPO status , Patient's Chart, lab work & pertinent test results  History of Anesthesia Complications Negative for: history of anesthetic complications  Airway Mallampati: II       Dental   Pulmonary asthma (no problems in > a year) ,           Cardiovascular negative cardio ROS       Neuro/Psych negative neurological ROS     GI/Hepatic negative GI ROS, Neg liver ROS,   Endo/Other  negative endocrine ROS  Renal/GU Renal disease (stones)     Musculoskeletal   Abdominal   Peds  Hematology negative hematology ROS (+)   Anesthesia Other Findings   Reproductive/Obstetrics                             Anesthesia Physical Anesthesia Plan  ASA: II  Anesthesia Plan: General   Post-op Pain Management:    Induction: Intravenous  Airway Management Planned: Oral ETT  Additional Equipment:   Intra-op Plan:   Post-operative Plan:   Informed Consent: I have reviewed the patients History and Physical, chart, labs and discussed the procedure including the risks, benefits and alternatives for the proposed anesthesia with the patient or authorized representative who has indicated his/her understanding and acceptance.     Plan Discussed with:   Anesthesia Plan Comments:        Anesthesia Quick Evaluation

## 2016-09-27 NOTE — Progress Notes (Signed)
Surgery cancelled today, preop UCx positive for greater than 100 K E. Coli, will need to treat prior to surgery.  Plan to reschedule for next week if possible about appropriate abx.  Augmentin bid x 10 days sent to pharmacy.    Hollice Espy, MD

## 2016-09-27 NOTE — Telephone Encounter (Signed)
Notified pt of surgery rescheduled with Dr Erlene Quan to 10/03/16 & to call Friday prior to surgery for arrival time to [facility]. Pt voices understanding.

## 2016-09-29 NOTE — Pre-Procedure Instructions (Signed)
SPOKE TO PATIENT AND NO CHANGES IN HEALTH. TAKING ANTIBIOTIC. HAS PREOP INSTRUCTIONS FROM SURGERY CNL 09/27/16 AND TO CALL 09/30/16 FOR ARRIVAL TIME

## 2016-10-02 MED ORDER — SODIUM CHLORIDE 0.9 % IV SOLN
2.0000 g | INTRAVENOUS | Status: AC
  Administered 2016-10-03: 2 g via INTRAVENOUS

## 2016-10-02 MED ORDER — GENTAMICIN SULFATE 40 MG/ML IJ SOLN
120.0000 mg | INTRAMUSCULAR | Status: AC
  Administered 2016-10-03: 120 mg via INTRAVENOUS
  Filled 2016-10-02: qty 3

## 2016-10-03 ENCOUNTER — Encounter: Payer: Self-pay | Admitting: *Deleted

## 2016-10-03 ENCOUNTER — Ambulatory Visit
Admission: RE | Admit: 2016-10-03 | Discharge: 2016-10-03 | Disposition: A | Discharge status: Home / Self Care | Payer: BLUE CROSS/BLUE SHIELD | Source: Ambulatory Visit | Attending: Urology | Admitting: Urology

## 2016-10-03 ENCOUNTER — Ambulatory Visit: Payer: BLUE CROSS/BLUE SHIELD | Admitting: Anesthesiology

## 2016-10-03 ENCOUNTER — Encounter
Admission: RE | Disposition: A | Discharge status: Home / Self Care | Payer: Self-pay | Source: Ambulatory Visit | Attending: Urology

## 2016-10-03 DIAGNOSIS — Z981 Arthrodesis status: Secondary | ICD-10-CM | POA: Insufficient documentation

## 2016-10-03 DIAGNOSIS — Z886 Allergy status to analgesic agent status: Secondary | ICD-10-CM | POA: Insufficient documentation

## 2016-10-03 DIAGNOSIS — Z86010 Personal history of colonic polyps: Secondary | ICD-10-CM | POA: Insufficient documentation

## 2016-10-03 DIAGNOSIS — E785 Hyperlipidemia, unspecified: Secondary | ICD-10-CM | POA: Insufficient documentation

## 2016-10-03 DIAGNOSIS — Z90710 Acquired absence of both cervix and uterus: Secondary | ICD-10-CM | POA: Diagnosis not present

## 2016-10-03 DIAGNOSIS — J45909 Unspecified asthma, uncomplicated: Secondary | ICD-10-CM | POA: Diagnosis not present

## 2016-10-03 DIAGNOSIS — Z888 Allergy status to other drugs, medicaments and biological substances status: Secondary | ICD-10-CM | POA: Insufficient documentation

## 2016-10-03 DIAGNOSIS — N132 Hydronephrosis with renal and ureteral calculous obstruction: Secondary | ICD-10-CM | POA: Diagnosis not present

## 2016-10-03 DIAGNOSIS — N201 Calculus of ureter: Secondary | ICD-10-CM

## 2016-10-03 DIAGNOSIS — Z87442 Personal history of urinary calculi: Secondary | ICD-10-CM | POA: Diagnosis not present

## 2016-10-03 DIAGNOSIS — Z885 Allergy status to narcotic agent status: Secondary | ICD-10-CM | POA: Diagnosis not present

## 2016-10-03 DIAGNOSIS — M1990 Unspecified osteoarthritis, unspecified site: Secondary | ICD-10-CM | POA: Diagnosis not present

## 2016-10-03 DIAGNOSIS — Z882 Allergy status to sulfonamides status: Secondary | ICD-10-CM | POA: Insufficient documentation

## 2016-10-03 DIAGNOSIS — N202 Calculus of kidney with calculus of ureter: Secondary | ICD-10-CM | POA: Diagnosis not present

## 2016-10-03 HISTORY — PX: URETEROSCOPY WITH HOLMIUM LASER LITHOTRIPSY: SHX6645

## 2016-10-03 HISTORY — PX: CYSTOSCOPY W/ URETERAL STENT PLACEMENT: SHX1429

## 2016-10-03 SURGERY — URETEROSCOPY, WITH LITHOTRIPSY USING HOLMIUM LASER
Anesthesia: General | Laterality: Right | Wound class: Clean Contaminated

## 2016-10-03 MED ORDER — ONDANSETRON HCL 4 MG/2ML IJ SOLN
INTRAMUSCULAR | Status: DC | PRN
  Administered 2016-10-03: 4 mg via INTRAVENOUS

## 2016-10-03 MED ORDER — MIDAZOLAM HCL 2 MG/2ML IJ SOLN
INTRAMUSCULAR | Status: DC | PRN
  Administered 2016-10-03: 2 mg via INTRAVENOUS

## 2016-10-03 MED ORDER — LIDOCAINE HCL (CARDIAC) 20 MG/ML IV SOLN
INTRAVENOUS | Status: DC | PRN
  Administered 2016-10-03: 100 mg via INTRAVENOUS

## 2016-10-03 MED ORDER — LACTATED RINGERS IV SOLN
INTRAVENOUS | Status: DC
  Administered 2016-10-03 (×3): via INTRAVENOUS

## 2016-10-03 MED ORDER — SUGAMMADEX SODIUM 200 MG/2ML IV SOLN
INTRAVENOUS | Status: DC | PRN
  Administered 2016-10-03: 200 mg via INTRAVENOUS

## 2016-10-03 MED ORDER — SUCCINYLCHOLINE CHLORIDE 20 MG/ML IJ SOLN
INTRAMUSCULAR | Status: DC | PRN
  Administered 2016-10-03: 100 mg via INTRAVENOUS

## 2016-10-03 MED ORDER — FENTANYL CITRATE (PF) 100 MCG/2ML IJ SOLN
INTRAMUSCULAR | Status: AC
  Administered 2016-10-03: 50 ug via INTRAVENOUS
  Filled 2016-10-03: qty 2

## 2016-10-03 MED ORDER — PROPOFOL 10 MG/ML IV BOLUS
INTRAVENOUS | Status: DC | PRN
  Administered 2016-10-03: 150 mg via INTRAVENOUS

## 2016-10-03 MED ORDER — FENTANYL CITRATE (PF) 100 MCG/2ML IJ SOLN
25.0000 ug | INTRAMUSCULAR | Status: DC | PRN
  Administered 2016-10-03 (×2): 50 ug via INTRAVENOUS

## 2016-10-03 MED ORDER — HYDROCODONE-ACETAMINOPHEN 5-325 MG PO TABS: 1.0000 | ORAL_TABLET | Freq: Four times a day (QID) | ORAL | 0 refills | Status: DC | PRN

## 2016-10-03 MED ORDER — FAMOTIDINE 20 MG PO TABS
ORAL_TABLET | ORAL | Status: AC
  Filled 2016-10-03: qty 1

## 2016-10-03 MED ORDER — FENTANYL CITRATE (PF) 100 MCG/2ML IJ SOLN
INTRAMUSCULAR | Status: DC | PRN
  Administered 2016-10-03: 100 ug via INTRAVENOUS
  Administered 2016-10-03 (×2): 50 ug via INTRAVENOUS

## 2016-10-03 MED ORDER — OXYBUTYNIN CHLORIDE 5 MG PO TABS: 5.0000 mg | ORAL_TABLET | Freq: Three times a day (TID) | ORAL | 0 refills | Status: DC | PRN

## 2016-10-03 MED ORDER — PROMETHAZINE HCL 25 MG/ML IJ SOLN
INTRAMUSCULAR | Status: AC
  Administered 2016-10-03: 6.25 mg via INTRAVENOUS
  Filled 2016-10-03: qty 1

## 2016-10-03 MED ORDER — SODIUM CHLORIDE 0.9 % IV SOLN
INTRAVENOUS | Status: AC
  Filled 2016-10-03: qty 2000

## 2016-10-03 MED ORDER — SODIUM CHLORIDE 0.9 % IJ SOLN
INTRAMUSCULAR | Status: AC
  Filled 2016-10-03: qty 10

## 2016-10-03 MED ORDER — ROCURONIUM BROMIDE 100 MG/10ML IV SOLN
INTRAVENOUS | Status: DC | PRN
  Administered 2016-10-03: 10 mg via INTRAVENOUS
  Administered 2016-10-03: 20 mg via INTRAVENOUS
  Administered 2016-10-03: 10 mg via INTRAVENOUS

## 2016-10-03 MED ORDER — PHENYLEPHRINE HCL 10 MG/ML IJ SOLN
INTRAMUSCULAR | Status: DC | PRN
  Administered 2016-10-03: 100 ug via INTRAVENOUS

## 2016-10-03 MED ORDER — PROMETHAZINE HCL 25 MG/ML IJ SOLN
6.2500 mg | INTRAMUSCULAR | Status: DC | PRN
  Administered 2016-10-03: 6.25 mg via INTRAVENOUS

## 2016-10-03 MED ORDER — FAMOTIDINE 20 MG PO TABS
20.0000 mg | ORAL_TABLET | Freq: Once | ORAL | Status: AC
  Administered 2016-10-03: 20 mg via ORAL

## 2016-10-03 MED ORDER — IOTHALAMATE MEGLUMINE 43 % IV SOLN
INTRAVENOUS | Status: DC | PRN
  Administered 2016-10-03: 14 mL

## 2016-10-03 MED ORDER — MEPERIDINE HCL 25 MG/ML IJ SOLN: 6.2500 mg | INTRAMUSCULAR | Status: DC | PRN

## 2016-10-03 MED ORDER — EPHEDRINE SULFATE 50 MG/ML IJ SOLN
INTRAMUSCULAR | Status: DC | PRN
  Administered 2016-10-03: 10 mg via INTRAVENOUS

## 2016-10-03 MED ORDER — DEXAMETHASONE SODIUM PHOSPHATE 10 MG/ML IJ SOLN
INTRAMUSCULAR | Status: DC | PRN
  Administered 2016-10-03: 10 mg via INTRAVENOUS

## 2016-10-03 MED ORDER — DOCUSATE SODIUM 100 MG PO CAPS
100.0000 mg | ORAL_CAPSULE | Freq: Two times a day (BID) | ORAL | 0 refills | Status: DC
Start: 2016-10-03 — End: 2017-10-10

## 2016-10-03 SURGICAL SUPPLY — 32 items
BAG DRAIN CYSTO-URO LG1000N (MISCELLANEOUS) ×2 IMPLANT
BASKET ZERO TIP 1.9FR (BASKET) IMPLANT
BRUSH SCRUB EZ 1% IODOPHOR (MISCELLANEOUS) ×2 IMPLANT
CATH URETL 5X70 OPEN END (CATHETERS) ×2 IMPLANT
CNTNR SPEC 2.5X3XGRAD LEK (MISCELLANEOUS) ×1
CONRAY 43 FOR UROLOGY 50M (MISCELLANEOUS) ×2 IMPLANT
CONT SPEC 4OZ STER OR WHT (MISCELLANEOUS) ×1
CONTAINER SPEC 2.5X3XGRAD LEK (MISCELLANEOUS) ×1 IMPLANT
DRAPE UTILITY 15X26 TOWEL STRL (DRAPES) ×2 IMPLANT
FIBER LASER LITHO 273 (Laser) ×2 IMPLANT
GLOVE BIO SURGEON STRL SZ 6.5 (GLOVE) ×2 IMPLANT
GOWN STRL REUS W/ TWL LRG LVL3 (GOWN DISPOSABLE) ×2 IMPLANT
GOWN STRL REUS W/ TWL LRG LVL4 (GOWN DISPOSABLE) ×2 IMPLANT
GOWN STRL REUS W/TWL LRG LVL3 (GOWN DISPOSABLE) ×2
GOWN STRL REUS W/TWL LRG LVL4 (GOWN DISPOSABLE) ×2
GUIDEWIRE SUPER STIFF (WIRE) ×2 IMPLANT
INFUSOR MANOMETER BAG 3000ML (MISCELLANEOUS) ×2 IMPLANT
INTRODUCER DILATOR DOUBLE (INTRODUCER) ×2 IMPLANT
KIT RM TURNOVER CYSTO AR (KITS) ×2 IMPLANT
PACK CYSTO AR (MISCELLANEOUS) ×2 IMPLANT
SCRUB POVIDONE IODINE 4 OZ (MISCELLANEOUS) IMPLANT
SENSORWIRE 0.038 NOT ANGLED (WIRE) ×2
SET CYSTO W/LG BORE CLAMP LF (SET/KITS/TRAYS/PACK) ×2 IMPLANT
SHEATH URETERAL 12FRX35CM (MISCELLANEOUS) IMPLANT
SOL .9 NS 3000ML IRR  AL (IV SOLUTION) ×1
SOL .9 NS 3000ML IRR UROMATIC (IV SOLUTION) ×1 IMPLANT
STENT URET 6FRX24 CONTOUR (STENTS) ×2 IMPLANT
STENT URET 6FRX26 CONTOUR (STENTS) IMPLANT
SURGILUBE 2OZ TUBE FLIPTOP (MISCELLANEOUS) ×2 IMPLANT
SYRINGE IRR TOOMEY STRL 70CC (SYRINGE) ×2 IMPLANT
WATER STERILE IRR 1000ML POUR (IV SOLUTION) ×2 IMPLANT
WIRE SENSOR 0.038 NOT ANGLED (WIRE) ×1 IMPLANT

## 2016-10-03 NOTE — Transfer of Care (Signed)
Immediate Anesthesia Transfer of Care Note  Patient: Monica Delacruz  Procedure(s) Performed: Procedure(s): URETEROSCOPY WITH HOLMIUM LASER LITHOTRIPSY (Right) CYSTOSCOPY WITH STENT REPLACEMENT (Right)  Patient Location: PACU  Anesthesia Type:General  Level of Consciousness: sedated  Airway & Oxygen Therapy: Patient Spontanous Breathing and Patient connected to face mask oxygen  Post-op Assessment: Report given to RN and Post -op Vital signs reviewed and stable  Post vital signs: Reviewed and stable  Last Vitals:  Vitals:   10/03/16 1120 10/03/16 1550  BP: (!) 149/83 130/77  Pulse: 89 82  Resp: 16 17  Temp: 36.8 C 0000000 C    Complications: No apparent anesthesia complications

## 2016-10-03 NOTE — Discharge Instructions (Signed)
You have a ureteral stent in place.  This is a tube that extends from your kidney to your bladder.  This may cause urinary bleeding, burning with urination, and urinary frequency.  Please call our office or present to the ED if you develop fevers >101 or pain which is not able to be controlled with oral pain medications.  You may be given either Flomax and/ or ditropan to help with bladder spasms and stent pain in addition to pain medications.   ° °[facility] °[address], [address] °[place], [place] [address] °[phone] °

## 2016-10-03 NOTE — Interval H&P Note (Signed)
History and Physical Interval Note:  10/03/2016 1:46 PM  Monica Delacruz  has presented today for surgery, with the diagnosis of RIGHT URETERAL STONE  The various methods of treatment have been discussed with the patient and family. After consideration of risks, benefits and other options for treatment, the patient has consented to  Procedure(s): URETEROSCOPY WITH HOLMIUM LASER LITHOTRIPSY (Right) CYSTOSCOPY WITH STENT REPLACEMENT (Right) as a surgical intervention .  The patient's history has been reviewed, patient examined, no change in status, stable for surgery.  I have reviewed the patient's chart and labs.  Questions were answered to the patient's satisfaction.    RRR CTAB  Previous + UCx adequately treated   Hollice Espy

## 2016-10-03 NOTE — Progress Notes (Signed)
Voided x11

## 2016-10-03 NOTE — Anesthesia Procedure Notes (Signed)
Procedure Name: Intubation Date/Time: 10/03/2016 2:16 PM Performed by: Demetrius Charity Pre-anesthesia Checklist: Patient identified, Patient being monitored, Timeout performed, Emergency Drugs available and Suction available Patient Re-evaluated:Patient Re-evaluated prior to inductionOxygen Delivery Method: Circle system utilized Preoxygenation: Pre-oxygenation with 100% oxygen Intubation Type: IV induction Ventilation: Mask ventilation without difficulty Laryngoscope Size: Mac and 3 Grade View: Grade I Tube type: Oral Tube size: 7.0 mm Number of attempts: 1 Placement Confirmation: ETT inserted through vocal cords under direct vision,  positive ETCO2 and breath sounds checked- equal and bilateral Secured at: 21 cm Tube secured with: Tape Dental Injury: Teeth and Oropharynx as per pre-operative assessment

## 2016-10-03 NOTE — Op Note (Signed)
Date of procedure: 10/03/16  Preoperative diagnosis:  1. History of urosepsis 2. Right ureteral stone 3. Right renal pelvic stone   Postoperative diagnosis:  1. Same as above   Procedure: 1. Right ureteroscopy 2. Laser lithotripsy 3. Right ureteral stent exchange 4. Right retrograde pyelogram 5. Basket extraction of Stone fragment  Surgeon: Hollice Espy, MD  Anesthesia: General  Complications: None  Intraoperative findings: 6 mm ureteral stone as well as 14 mm renal pelvic stone treated. Significant renal pelvic edema and stent encrustation appreciated.  EBL: Minimal  Specimens: Stone fragment  Drains: 6 x 26 French double-J ureteral stent on right  Indication: Monica Delacruz is a 65 y.o. patient with with an obstructing 6 mm stone who underwent urgent ureteral stent placement a studding of fevers. She is been treated adequately with by mouth antibiotics. She returns today for definitive management of her stones.  After reviewing the management options for treatment, she elected to proceed with the above surgical procedure(s). We have discussed the potential benefits and risks of the procedure, side effects of the proposed treatment, the likelihood of the patient achieving the goals of the procedure, and any potential problems that might occur during the procedure or recuperation. Informed consent has been obtained.  Description of procedure:  The patient was taken to the operating room and general anesthesia was induced.  The patient was placed in the dorsal lithotomy position, prepped and draped in the usual sterile fashion, and preoperative antibiotics were administered. A preoperative time-out was performed.   A 21 French cystoscope was advanced per urethra into the bladder. Attention was turned to the right ureteral orifice from which a ureteral stent was seen emanating. The distal coil of the stent was relatively encrusted. This is grasped using stent graspers and  brought to level of the urethral meatus. Then attempted to cannulate the stent, however, due to severe encrustation of the proximal coil, was unable to cannulate the stent. Really the stent was removed and a sensor wire was advanced all the way up to level of the kidney without difficulty. This was snapped in place as a safety wire. I advanced a semirigid ureteroscope alongside the stent up to level of the proximal ureter and didn't see the stone. There was some edema within the ureter as well as blood limiting visualization. At this point I assume that she had passed the ureteral stone. The ureter was widely patent. A second Super Stiff wire was then advanced up to level of the kidney. A Cook ureteral access sheath was then advanced up to level of the proximal ureter over the Super Stiff wire. The inner cannula was then removed. A dual lumen 8 French flexible ureteroscope was then advanced up to level of the kidney where the 14 mm renal pelvic stone was identified. A 270  laser fiber was then brought in and using settings of 0.2 J and 40 Hz, the stone was dusted into tiny particles. The majority of these particles after being adequately fragmented were removed using a 1.9 [place] nitinol tip less basket. Once the majority of stones were removed and only tiny fragments remained, the scope was backed to level of the proximal ureter where retrograde pyelogram was performed. A roadmap of the kidney. This was then used to ensure that each every calyx was strictly visualized. No residual stone fragments of significant size were appreciated larger than the tip of the laser fiber. The scope was then backed down the length of the ureter inspecting along the  way. At the mid ureter where the stone had previously been seen on CT, the stone was now visible. The flexible ureteroscope was then exchanged back for the semirigid ureteroscope. The laser was then used to break up the stone into approximately 6-7 smaller pieces. These  were then individually basketed out. There was some ureteral mucosal disruption where the stone admitted into the medial sidewall of the ureter at the level of the mid ureter. There were no residual fragments embedded within the wall of the ureter visible.  After all ureteral stone fragments were cleared, the scope was removed. A 6 x 26 French double-J ureteral stent was then advanced over the wire up to level of the kidney. The wire was partially drawn until full coil was noted within the renal pelvis or the wire was then fully withdrawn and a full coil was noted within the bladder. The bladder was then drained. The patient was then cleaned and dried, repositioned the supine position, reversed from anesthesia, taken to the PACU in stable condition.  Plan: Patient will follow-up late next week for cystoscopy, stent removal. She will complete her course of by mouth antibiotics.  Hollice Espy, M.D.

## 2016-10-03 NOTE — Anesthesia Preprocedure Evaluation (Signed)
Anesthesia Evaluation  Patient identified by MRN, date of birth, ID band Patient awake    Reviewed: Allergy & Precautions, NPO status , Patient's Chart, lab work & pertinent test results  History of Anesthesia Complications Negative for: history of anesthetic complications  Airway Mallampati: II  TM Distance: >3 FB Neck ROM: Full    Dental no notable dental hx.    Pulmonary asthma (mild intermittent) , neg sleep apnea,    breath sounds clear to auscultation- rhonchi (-) wheezing      Cardiovascular Exercise Tolerance: Good (-) hypertension(-) CAD and (-) Past MI  Rhythm:Regular Rate:Normal - Systolic murmurs and - Diastolic murmurs    Neuro/Psych  Headaches, negative psych ROS   GI/Hepatic negative GI ROS, Neg liver ROS,   Endo/Other  negative endocrine ROSneg diabetes  Renal/GU Renal disease: nephrolithiasis.     Musculoskeletal  (+) Arthritis ,   Abdominal (+) - obese,   Peds  Hematology negative hematology ROS (+)   Anesthesia Other Findings Past Medical History: No date: Asthma     Comment: takes Singulair daily and Dulera prn No date: Back pain     Comment: lumbar fracture No date: Diverticulosis No date: Headache     Comment: Migraine No date: History of bronchitis     Comment: 2014 No date: History of colon polyps No date: History of kidney stones No date: History of migraine     Comment: last one about 2wks ago No date: Hyperlipidemia     Comment: takes Lovasatin daily No date: Seasonal allergies     Comment: takes Claritin daily and Flonase daily   Reproductive/Obstetrics                             Anesthesia Physical Anesthesia Plan  ASA: II  Anesthesia Plan: General   Post-op Pain Management:    Induction: Intravenous  Airway Management Planned: Oral ETT  Additional Equipment:   Intra-op Plan:   Post-operative Plan: Extubation in OR  Informed  Consent: I have reviewed the patients History and Physical, chart, labs and discussed the procedure including the risks, benefits and alternatives for the proposed anesthesia with the patient or authorized representative who has indicated his/her understanding and acceptance.   Dental advisory given  Plan Discussed with: CRNA and Anesthesiologist  Anesthesia Plan Comments:         Anesthesia Quick Evaluation

## 2016-10-03 NOTE — H&P (View-Only) (Signed)
09/22/2016 9:24 AM   Monica Delacruz 06/11/1951 [id number]  Referring provider: Margo Common, [place], [place] [address]  Chief Complaint  Patient presents with  . Nephrolithiasis    2 wk follow up    HPI: The patient is a 65 year old female who underwent an emergent right ureteral stent placement for 7 mm right distal ureteral stone with proximal hydronephrosis and emphysematous pyelitis returns today to discuss definitive stone management.    She also has a 14 mm stone in her right renal pelvis as well smaller calcifications within her left kidney. She has done well since surgery. She does have occasional nausea but no other symptoms. She denies dysuria or flank pain.  The patient has had recurrent nephrolithiasis with ureteroscopy and lithotripsy in the past. She has 24-hour urine study which per the patient only showed elevated vitamin C with no other correctable factors.    PMH: Past Medical History:  Diagnosis Date  . Asthma    takes Singulair daily and Dulera prn  . Back pain    lumbar fracture  . Diverticulosis   . History of bronchitis    2014  . History of colon polyps   . History of kidney stones   . History of migraine    last one about 2wks ago  . Hyperlipidemia    takes Lovasatin daily  . Seasonal allergies    takes Claritin daily and Flonase daily    Surgical History: Past Surgical History:  Procedure Laterality Date  . ABDOMINAL HYSTERECTOMY  1985  . COLONOSCOPY    . CYSTOSCOPY    . CYSTOSCOPY/RETROGRADE/URETEROSCOPY Right 08/29/2016   Procedure: CYSTOSCOPY/RETROGRADE/stent placement;  Surgeon: Alexis Frock, MD;  Location: [facility];  Service: Urology;  Laterality: Right;  . FOOT SURGERY     bilateral  . JOINT REPLACEMENT Left 2009   knee  . LAMINECTOMY WITH POSTERIOR LATERAL ARTHRODESIS LEVEL 4 N/A 07/12/2013   Procedure: Thoracic Ten Through Lumbar Three posterior lateral fusion with instrumentation;  Surgeon: Winfield Cunas, MD;  Location: [facility];  Service: Neurosurgery;  Laterality: N/A;  Thoracic Ten Through Lumbar Three posterior lateral fusion with instrumentation  . LITHOTRIPSY     x 2  . NASAL SINUS SURGERY     x 2    Home Medications:    Medication List       Accurate as of 09/22/16  9:24 AM. Always use your most recent med list.          Butalbital-APAP-Caff-Cod 50-300-40-30 MG Caps Take 1 tablet by mouth 4 (four) times daily as needed. For migraines.   CALTRATE 600+D3 SOFT PO Take by mouth.   MEGARED OMEGA-3 KRILL OIL PO Take by mouth.   ondansetron 4 MG disintegrating tablet Commonly known as:  ZOFRAN ODT Take 1 tablet (4 mg total) by mouth every 8 (eight) hours as needed for nausea or vomiting.   oxyCODONE 5 MG immediate release tablet Commonly known as:  Oxy IR/ROXICODONE Take 1 tablet (5 mg total) by mouth every 4 (four) hours as needed for moderate pain.   Turmeric 500 MG Caps Take by mouth.   WOMENS MULTIVITAMIN PO Take by mouth.       Allergies:  Allergies  Allergen Reactions  . Adhesive [Tape] Other (See Comments)    Blisters skin  . Aspirin Other (See Comments)    Burning. Burns stomach  . Hydrocodone Itching  . Ofloxacin Nausea Only  . Oxycodone Hcl  Weird dreams  . Simvastatin     GI upset  . Sulfa Antibiotics Hives and Itching  . Tramadol Diarrhea, Nausea Only and Nausea And Vomiting    Family History: Family History  Problem Relation Age of Onset  . Hematuria Mother     Social History:  reports that she has never smoked. She has never used smokeless tobacco. She reports that she drinks alcohol. She reports that she does not use drugs.  ROS: UROLOGY Frequent Urination?: Yes Hard to postpone urination?: No Burning/pain with urination?: No Get up at night to urinate?: Yes Leakage of urine?: Yes Urine stream starts and stops?: No Trouble starting stream?: No Do you have to strain to urinate?: No Blood in urine?:  Yes Urinary tract infection?: Yes Sexually transmitted disease?: No Injury to kidneys or bladder?: No Painful intercourse?: No Weak stream?: No Currently pregnant?: No Vaginal bleeding?: No Last menstrual period?: N  Gastrointestinal Nausea?: No Vomiting?: No Indigestion/heartburn?: No Diarrhea?: No Constipation?: No  Constitutional Fever: No Night sweats?: No Weight loss?: No Fatigue?: No  Skin Skin rash/lesions?: No Itching?: No  Eyes Blurred vision?: No Double vision?: No  Ears/Nose/Throat Sore throat?: No Sinus problems?: No  Hematologic/Lymphatic Swollen glands?: No Easy bruising?: No  Cardiovascular Leg swelling?: No Chest pain?: No  Respiratory Cough?: No Shortness of breath?: No  Endocrine Excessive thirst?: No  Musculoskeletal Back pain?: No Joint pain?: No  Neurological Headaches?: No Dizziness?: No  Psychologic Depression?: No Anxiety?: No  Physical Exam: BP 131/80   Pulse 82   Ht '5\' 5"'  (1.651 m)   Wt 171 lb (77.6 kg)   BMI 28.46 kg/m   Constitutional:  Alert and oriented, No acute distress. HEENT: [place] AT, moist mucus membranes.  Trachea midline, no masses. Cardiovascular: No clubbing, cyanosis, or edema. Respiratory: Normal respiratory effort, no increased work of breathing. GI: Abdomen is soft, nontender, nondistended, no abdominal masses GU: No CVA tenderness.  Skin: No rashes, bruises or suspicious lesions. Lymph: No cervical or inguinal adenopathy. Neurologic: Grossly intact, no focal deficits, moving all 4 extremities. Psychiatric: Normal mood and affect.  Laboratory Data: Lab Results  Component Value Date   WBC 19.7 (H) 08/31/2016   HGB 10.9 (L) 08/31/2016   HCT 32.3 (L) 08/31/2016   MCV 89.3 08/31/2016   PLT 149 (L) 08/31/2016    Lab Results  Component Value Date   CREATININE 0.87 08/31/2016    No results found for: PSA  No results found for: TESTOSTERONE  Lab Results  Component Value Date   HGBA1C  5.7 (H) 08/28/2016    Urinalysis    Component Value Date/Time   COLORURINE YELLOW (A) 08/28/2016 2217   APPEARANCEUR HAZY (A) 08/28/2016 2217   LABSPEC 1.013 08/28/2016 2217   PHURINE 5.0 08/28/2016 2217   GLUCOSEU NEGATIVE 08/28/2016 2217   HGBUR 2+ (A) 08/28/2016 2217   [place] NEGATIVE 08/28/2016 2217   [place] NEGATIVE 08/28/2016 2217   PROTEINUR NEGATIVE 08/28/2016 2217   NITRITE POSITIVE (A) 08/28/2016 2217   LEUKOCYTESUR 3+ (A) 08/28/2016 2217      Assessment & Plan:    1. Right nephrolithiasis I discussed with the patient the treatment options for her stones now that her infection has cleared. Given that she has a fairly large right renal stone as well as her obstructing right ureteral stone, I think her best option would be right ureteroscopy to give her the best chance of being stone free with one surgery. We discussed this procedure in great detail. She has  been through it before. She understands the risks include but are not much to bleeding, infection, iatrogenic injury, need for repeat procedures, need to replace ureteral stent temporarily after the procedure. All questions were answered. The patient is agreeable to proceeding. She would like to be scheduled as soon as possible as she has met her deductible for the year.     Nickie Retort, MD  [facility] [address], [place], [place] [address] [phone]

## 2016-10-04 NOTE — Anesthesia Postprocedure Evaluation (Signed)
Anesthesia Post Note  Patient: Monica Delacruz  Procedure(s) Performed: Procedure(s) (LRB): URETEROSCOPY WITH HOLMIUM LASER LITHOTRIPSY (Right) CYSTOSCOPY WITH STENT REPLACEMENT (Right)  Patient location during evaluation: PACU Anesthesia Type: General Level of consciousness: awake and alert Pain management: pain level controlled Vital Signs Assessment: post-procedure vital signs reviewed and stable Respiratory status: spontaneous breathing, nonlabored ventilation, respiratory function stable and patient connected to nasal cannula oxygen Cardiovascular status: blood pressure returned to baseline and stable Postop Assessment: no signs of nausea or vomiting Anesthetic complications: no    Last Vitals:  Vitals:   10/03/16 1644 10/03/16 1700  BP: 131/75 120/64  Pulse: 82 85  Resp: 16   Temp: (!) 36.1 C     Last Pain:  Vitals:   10/03/16 1644  TempSrc: Temporal  PainSc: 2                  Martha Clan

## 2016-10-05 ENCOUNTER — Encounter: Payer: Self-pay | Admitting: Urology

## 2016-10-06 ENCOUNTER — Telehealth: Payer: Self-pay | Admitting: Urology

## 2016-10-06 DIAGNOSIS — N200 Calculus of kidney: Secondary | ICD-10-CM

## 2016-10-06 MED ORDER — ONDANSETRON HCL 4 MG PO TABS: 4.0000 mg | ORAL_TABLET | Freq: Three times a day (TID) | ORAL | 0 refills | Status: DC | PRN

## 2016-10-06 NOTE — Telephone Encounter (Signed)
Patient had surgery on Monday and she said she is nauseated and not able to eat and wants to know if she can have something for this. Dr. Pilar Jarvis gave her something the last time and she wants more of that if possible??  Thanks, Sharyn Lull

## 2016-10-06 NOTE — Telephone Encounter (Signed)
Spoke with pt and made aware zofran was sent to pharmacy. Pt voiced understanding.

## 2016-10-06 NOTE — Telephone Encounter (Signed)
She can have Zofran 4 mg as needed to 8 hours. Dispense #10 with no refills.  Hollice Espy, MD

## 2016-10-10 LAB — STONE ANALYSIS
Ca Hydrogen Phos.: 20 %
Ca Oxalate,Dihydrate: 10 %
Ca Oxalate,Monohydr.: 40 %
Ca phos cry stone ql IR: 30 %
Stone Weight KSTONE: 11 mg

## 2016-10-11 ENCOUNTER — Telehealth: Payer: Self-pay

## 2016-10-11 NOTE — Telephone Encounter (Signed)
Patient advised. Patient states she has a follow up appointment scheduled with urology.

## 2016-10-11 NOTE — Telephone Encounter (Signed)
-----   Message from Margo Common, [place] sent at 10/11/2016  9:29 AM EST ----- Follow up with urologist to review stone analysis and get their recommendations to prevent further stone development.

## 2016-10-13 ENCOUNTER — Ambulatory Visit (INDEPENDENT_AMBULATORY_CARE_PROVIDER_SITE_OTHER): Payer: BLUE CROSS/BLUE SHIELD | Admitting: Urology

## 2016-10-13 ENCOUNTER — Encounter: Payer: Self-pay | Admitting: Urology

## 2016-10-13 VITALS — BP 161/83 | HR 72 | Ht 65.0 in | Wt 169.0 lb

## 2016-10-13 DIAGNOSIS — N200 Calculus of kidney: Secondary | ICD-10-CM

## 2016-10-13 LAB — URINALYSIS, COMPLETE
Bilirubin, UA: NEGATIVE
Glucose, UA: NEGATIVE
Ketones, UA: NEGATIVE
Nitrite, UA: NEGATIVE
Specific Gravity, UA: 1.015 (ref 1.005–1.030)
Urobilinogen, Ur: 0.2 mg/dL (ref 0.2–1.0)
pH, UA: 7 (ref 5.0–7.5)

## 2016-10-13 LAB — MICROSCOPIC EXAMINATION
RBC, UA: >30 /hpf — AB (ref 0–?)
WBC, UA: >30 /hpf — AB (ref 0–?)

## 2016-10-13 MED ORDER — LIDOCAINE HCL 2 % EX GEL
1.0000 "application " | Freq: Once | CUTANEOUS | Status: AC
  Administered 2016-10-13: 1 via URETHRAL

## 2016-10-13 MED ORDER — CIPROFLOXACIN HCL 500 MG PO TABS
500.0000 mg | ORAL_TABLET | Freq: Once | ORAL | Status: AC
  Administered 2016-10-13: 500 mg via ORAL

## 2016-10-14 ENCOUNTER — Encounter: Payer: Self-pay | Admitting: Family Medicine

## 2016-10-14 ENCOUNTER — Ambulatory Visit (INDEPENDENT_AMBULATORY_CARE_PROVIDER_SITE_OTHER): Payer: BLUE CROSS/BLUE SHIELD | Admitting: Family Medicine

## 2016-10-14 VITALS — BP 138/86 | HR 80 | Temp 98.2°F | Resp 16 | Wt 172.0 lb

## 2016-10-14 DIAGNOSIS — E663 Overweight: Secondary | ICD-10-CM

## 2016-10-14 DIAGNOSIS — Z6828 Body mass index (BMI) 28.0-28.9, adult: Secondary | ICD-10-CM

## 2016-10-14 DIAGNOSIS — E7800 Pure hypercholesterolemia, unspecified: Secondary | ICD-10-CM

## 2016-10-14 MED ORDER — LOVASTATIN ER 20 MG PO TB24: 20.0000 mg | ORAL_TABLET | Freq: Every day | ORAL | 3 refills | Status: DC

## 2016-10-14 MED ORDER — PHENTERMINE HCL 37.5 MG PO TABS: 37.5000 mg | ORAL_TABLET | Freq: Every day | ORAL | 0 refills | Status: DC

## 2016-10-14 NOTE — Progress Notes (Signed)
Patient: Monica Delacruz Female    DOB: [birth year]   65 y.o.   MRN: [id number] Visit Date: 10/14/2016  Today's Provider: Vernie Murders, PA   Chief Complaint  Patient presents with  . Hyperlipidemia   Subjective:    Hyperlipidemia  This is a chronic (Pt is coming back in today to discuss going back on Lovastatin.  She discontinued it 10/2015 secondary to working on lifestyle changes, and fish oil.) problem. The problem is uncontrolled. Recent lipid tests were reviewed and are high. There are no known factors aggravating her hyperlipidemia. Associated symptoms include shortness of breath. Treatments tried: Fish oil, MegaRed. The current treatment provides no improvement of lipids.   Past Surgical History:  Procedure Laterality Date  . ABDOMINAL HYSTERECTOMY  1985  . BACK SURGERY    . COLONOSCOPY    . CYSTOSCOPY    . CYSTOSCOPY W/ URETERAL STENT PLACEMENT Right 10/03/2016   Procedure: CYSTOSCOPY WITH STENT REPLACEMENT;  Surgeon: Hollice Espy, MD;  Location: [facility];  Service: Urology;  Laterality: Right;  . CYSTOSCOPY/RETROGRADE/URETEROSCOPY Right 08/29/2016   Procedure: CYSTOSCOPY/RETROGRADE/stent placement;  Surgeon: Alexis Frock, MD;  Location: [facility];  Service: Urology;  Laterality: Right;  . FOOT SURGERY     bilateral  . JOINT REPLACEMENT Left 2009   knee  . LAMINECTOMY WITH POSTERIOR LATERAL ARTHRODESIS LEVEL 4 N/A 07/12/2013   Procedure: Thoracic Ten Through Lumbar Three posterior lateral fusion with instrumentation;  Surgeon: Winfield Cunas, MD;  Location: [facility];  Service: Neurosurgery;  Laterality: N/A;  Thoracic Ten Through Lumbar Three posterior lateral fusion with instrumentation  . LITHOTRIPSY     x 2  . NASAL SINUS SURGERY     x 2  . URETEROSCOPY WITH HOLMIUM LASER LITHOTRIPSY Right 10/03/2016   Procedure: URETEROSCOPY WITH HOLMIUM LASER LITHOTRIPSY;  Surgeon: Hollice Espy, MD;  Location: [facility];  Service: Urology;  Laterality: Right;    Family History  Problem Relation Age of Onset  . Hematuria Mother   . Diabetes type II Mother   . Hypertension Father   . Stomach cancer Father      Lab Results  Component Value Date   CHOL 282 (H) 08/23/2016   Lab Results  Component Value Date   HDL 68 08/23/2016   Lab Results  Component Value Date   LDLCALC 192 (H) 08/23/2016   Lab Results  Component Value Date   TRIG 108 08/23/2016   Lab Results  Component Value Date   CHOLHDL 4.1 08/23/2016   Past Medical History:  Diagnosis Date  . Asthma    takes Singulair daily and Dulera prn  . Back pain    lumbar fracture  . Diverticulosis   . Headache    Migraine  . History of bronchitis    2014  . History of colon polyps   . History of kidney stones   . History of migraine    last one about 2wks ago  . Hyperlipidemia    takes Lovasatin daily  . Seasonal allergies    takes Claritin daily and Flonase daily     Allergies  Allergen Reactions  . Tramadol Diarrhea, Nausea And Vomiting and Nausea Only  . Aspirin Other (See Comments)    Burning. Burns stomach  . Ofloxacin Nausea Only  . Oxycodone Hcl     Weird dreams  . Simvastatin     GI upset  . Sulfa Antibiotics Hives and Itching  . Adhesive [Tape] Other (See Comments)  Blisters skin    Current Outpatient Prescriptions:  .  Butalbital-APAP-Caff-Cod 50-300-40-30 MG CAPS, Take 1 tablet by mouth 4 (four) times daily as needed. For migraines., Disp: 30 capsule, Rfl: 0 .  Calcium Carb-Cholecalciferol (CALTRATE 600+D3 SOFT PO), Take 1 tablet by mouth daily. , Disp: , Rfl:  .  docusate sodium (COLACE) 100 MG capsule, Take 1 capsule (100 mg total) by mouth 2 (two) times daily., Disp: 60 capsule, Rfl: 0 .  HYDROcodone-acetaminophen (NORCO/VICODIN) 5-325 MG tablet, Take 1-2 tablets by mouth every 6 (six) hours as needed for moderate pain., Disp: 15 tablet, Rfl: 0 .  MEGARED OMEGA-3 KRILL OIL PO, Take 1 tablet by mouth daily. , Disp: , Rfl:  .  Multiple  Vitamins-Minerals (WOMENS MULTIVITAMIN PO), Take 1 tablet by mouth daily. , Disp: , Rfl:  .  ondansetron (ZOFRAN ODT) 4 MG disintegrating tablet, Take 1 tablet (4 mg total) by mouth every 8 (eight) hours as needed for nausea or vomiting., Disp: 20 tablet, Rfl: 0 .  oxybutynin (DITROPAN) 5 MG tablet, Take 1 tablet (5 mg total) by mouth every 8 (eight) hours as needed for bladder spasms., Disp: 30 tablet, Rfl: 0 .  oxyCODONE (OXY IR/ROXICODONE) 5 MG immediate release tablet, Take 1 tablet (5 mg total) by mouth every 4 (four) hours as needed for moderate pain., Disp: 30 tablet, Rfl: 0 .  Turmeric 500 MG CAPS, Take 500 mg by mouth daily. , Disp: , Rfl:   Review of Systems  Constitutional: Negative.   Respiratory: Positive for shortness of breath. Negative for apnea, cough, choking, chest tightness, wheezing and stridor.   Cardiovascular: Negative.   Gastrointestinal: Negative.   Musculoskeletal: Negative.   Neurological: Negative for dizziness, light-headedness and headaches.    Social History  Substance Use Topics  . Smoking status: Never Smoker  . Smokeless tobacco: Never Used  . Alcohol use Yes     Comment: wine rarely   Objective:   BP 138/86 (BP Location: Right Arm, Patient Position: Sitting, Cuff Size: Normal)   Pulse 80   Temp 98.2 F (36.8 C) (Oral)   Resp 16   Wt 172 lb (78 kg)   BMI 28.62 kg/m   Physical Exam  Constitutional: She is oriented to person, place, and time. She appears well-developed and well-nourished. No distress.  HENT:  Head: Normocephalic and atraumatic.  Right Ear: Hearing normal.  Left Ear: Hearing normal.  Nose: Nose normal.  Eyes: Conjunctivae and lids are normal. Right eye exhibits no discharge. Left eye exhibits no discharge. No scleral icterus.  Neck: Neck supple.  Cardiovascular: Normal rate and regular rhythm.   Pulmonary/Chest: Effort normal and breath sounds normal. No respiratory distress.  Abdominal: Soft. Bowel sounds are normal.    Neurological: She is alert and oriented to person, place, and time.  Skin: Skin is intact. No lesion and no rash noted.  Psychiatric: She has a normal mood and affect. Her speech is normal and behavior is normal. Thought content normal.      Assessment & Plan:     1. BMI 28.0-28.9,adult Overweight and having little success losing weight with diet plan and exercise only. Recommend lowering caloric intake and may use Phentermine the first month to curb appetite. Reviewed possible side effects and will recheck in 1 month. - phentermine (ADIPEX-P) 37.5 MG tablet; Take 1 tablet (37.5 mg total) by mouth daily before breakfast.  Dispense: 30 tablet; Refill: 0  2. Hypercholesterolemia without hypertriglyceridemia Cholesterol total and LDL high despite trying to exercise  and use MegaRed daily. Wants to go back on the Lovastatin she has used in the past. Restart Lovastatin 20 mg qd and follow low fat diet with exercise to lose weight. Recheck cholesterol in 2 months. - lovastatin (ALTOPREV) 20 MG 24 hr tablet; Take 1 tablet (20 mg total) by mouth at bedtime.  Dispense: 30 tablet; Refill: [place], [facility]

## 2016-10-21 NOTE — Progress Notes (Addendum)
   10/13/16  CC:  Chief Complaint  Patient presents with  . Cysto Stent Removal    HPI:  65 year old female initially presented with urosepsis from an obstructing right ureteral stone. She underwent urgent ureteral stent placement and staged ureteroscopy for treatment of the stone as well as a large 14 mm right renal pelvic stone on 10/03/2016. She returns to the office today for cystoscopy, stent removal. She does have mild discomfort from the stent, otherwise no fevers or chills. She is anxious to have her stent removed today.  Blood pressure (!) 161/83, pulse 72, height 5\' 5"  (1.651 m), weight 169 lb (76.7 kg). NED. A&Ox3.   No respiratory distress   Abd soft, NT, ND Normal external genitalia with patent urethral meatus  Cystoscopy/ Stent removal procedure  Patient identification was confirmed, informed consent was obtained, and patient was prepped using Betadine solution.  Lidocaine jelly was administered per urethral meatus.    Preoperative abx where received prior to procedure.    Procedure: - Flexible cystoscope introduced, without any difficulty.   - Thorough search of the bladder revealed:    normal urethral meatus  Stent seen emanating from right ureteral orifice, grasped with stent graspers, and removed in entirety.    Post-Procedure: - Patient tolerated the procedure well   Assessment/ Plan:  1. Right ureteral stone/ renal pelvic stone S/p R URS for treatement of 6 mm obstructing uretearl stone and 14 mm right renal pelvic stone F/u in 4 weeks with RUS prior Will review stone analysis and stone prevention techniques at that follow up  Return in about 4 weeks (around 11/10/2016) for f/u RUS with Dr. Pilar Jarvis.    Hollice Espy, MD

## 2016-11-01 ENCOUNTER — Telehealth: Payer: Self-pay | Admitting: Family Medicine

## 2016-11-01 DIAGNOSIS — E7800 Pure hypercholesterolemia, unspecified: Secondary | ICD-10-CM

## 2016-11-01 MED ORDER — LOVASTATIN 20 MG PO TABS: 20.0000 mg | ORAL_TABLET | Freq: Every day | ORAL | 3 refills | Status: DC

## 2016-11-01 NOTE — Telephone Encounter (Signed)
Per [facility] they received Altoprev (24 hr) instead of Mevacor 20 mg. Changed Rx and resent to [facility]. Patient is aware.

## 2016-11-01 NOTE — Telephone Encounter (Signed)
Lovastatin is the generic for Mevacor. Can switch to Pravastatin 20 mg qd #30 & 3RF to see if insurance covers it better.

## 2016-11-01 NOTE — Telephone Encounter (Signed)
Pt called wanting to know if her cholesterol medication can be changed to a generic medication.  She uses [facility].  When she went to pick up her rx for lovastatin and it was very expensive.  Pt's call back is [phone]  Thanks teri

## 2016-11-11 ENCOUNTER — Ambulatory Visit: Payer: BLUE CROSS/BLUE SHIELD

## 2016-11-14 ENCOUNTER — Ambulatory Visit: Payer: BLUE CROSS/BLUE SHIELD | Admitting: Family Medicine

## 2016-11-15 ENCOUNTER — Ambulatory Visit
Admission: RE | Admit: 2016-11-15 | Discharge: 2016-11-15 | Disposition: A | Discharge status: Home / Self Care | Payer: BLUE CROSS/BLUE SHIELD | Source: Ambulatory Visit | Attending: Urology | Admitting: Urology

## 2016-11-15 DIAGNOSIS — N200 Calculus of kidney: Secondary | ICD-10-CM | POA: Diagnosis not present

## 2016-11-25 ENCOUNTER — Encounter: Payer: Self-pay | Admitting: Urology

## 2016-11-25 ENCOUNTER — Ambulatory Visit (INDEPENDENT_AMBULATORY_CARE_PROVIDER_SITE_OTHER): Payer: BLUE CROSS/BLUE SHIELD | Admitting: Urology

## 2016-11-25 VITALS — BP 134/77 | HR 82 | Ht 65.0 in | Wt 168.0 lb

## 2016-11-25 DIAGNOSIS — N200 Calculus of kidney: Secondary | ICD-10-CM | POA: Diagnosis not present

## 2016-11-25 LAB — URINALYSIS, COMPLETE
Bilirubin, UA: NEGATIVE
Glucose, UA: NEGATIVE
Leukocytes, UA: NEGATIVE
Nitrite, UA: NEGATIVE
Protein, UA: NEGATIVE
Specific Gravity, UA: 1.025 (ref 1.005–1.030)
Urobilinogen, Ur: 0.2 mg/dL (ref 0.2–1.0)
pH, UA: 5.5 (ref 5.0–7.5)

## 2016-11-25 LAB — MICROSCOPIC EXAMINATION
Bacteria, UA: NONE SEEN
Epithelial Cells (non renal): >10 /hpf — AB (ref 0–10)

## 2016-11-25 NOTE — Progress Notes (Signed)
11/25/2016 2:45 PM   Monica Delacruz [birth year] [id number]  Referring provider: Margo Common, [place], [place] [address]  Chief Complaint  Patient presents with  . Follow-up    renal US results    HPI: The patient is a 66 year old female presents for 1 month follow-up after undergoing a cystoscopy and right ureteroscopy for a 6 mm right ureteral stone 14 mm right renal pelvic stone. Postoperative renal ultrasound shows no significant hydronephrosis. She has a known small stones on the left there were no address due to their small size at that time.  Her stone was 10% calcium oxalate dihydrate, 40% calcium oxalate monohydrate, calcium phosphate 3%, 20% calcium hydrogen phosphate.  This is her third stone episode. She had a 24-hour urine collection in the past that only showed too much vitamin C. She is not interested in repeating this.  PMH: Past Medical History:  Diagnosis Date  . Asthma    takes Singulair daily and Dulera prn  . Back pain    lumbar fracture  . Diverticulosis   . Headache    Migraine  . History of bronchitis    2014  . History of colon polyps   . History of kidney stones   . History of migraine    last one about 2wks ago  . Hyperlipidemia    takes Lovasatin daily  . Seasonal allergies    takes Claritin daily and Flonase daily    Surgical History: Past Surgical History:  Procedure Laterality Date  . ABDOMINAL HYSTERECTOMY  1985  . BACK SURGERY    . COLONOSCOPY    . CYSTOSCOPY    . CYSTOSCOPY W/ URETERAL STENT PLACEMENT Right 10/03/2016   Procedure: CYSTOSCOPY WITH STENT REPLACEMENT;  Surgeon: Hollice Espy, MD;  Location: [facility];  Service: Urology;  Laterality: Right;  . CYSTOSCOPY/RETROGRADE/URETEROSCOPY Right 08/29/2016   Procedure: CYSTOSCOPY/RETROGRADE/stent placement;  Surgeon: Alexis Frock, MD;  Location: [facility];  Service: Urology;  Laterality: Right;  . FOOT SURGERY     bilateral  . JOINT REPLACEMENT Left  2009   knee  . LAMINECTOMY WITH POSTERIOR LATERAL ARTHRODESIS LEVEL 4 N/A 07/12/2013   Procedure: Thoracic Ten Through Lumbar Three posterior lateral fusion with instrumentation;  Surgeon: Winfield Cunas, MD;  Location: [facility];  Service: Neurosurgery;  Laterality: N/A;  Thoracic Ten Through Lumbar Three posterior lateral fusion with instrumentation  . LITHOTRIPSY     x 2  . NASAL SINUS SURGERY     x 2  . URETEROSCOPY WITH HOLMIUM LASER LITHOTRIPSY Right 10/03/2016   Procedure: URETEROSCOPY WITH HOLMIUM LASER LITHOTRIPSY;  Surgeon: Hollice Espy, MD;  Location: [facility];  Service: Urology;  Laterality: Right;    Home Medications:  Allergies as of 11/25/2016      Reactions   Tramadol Diarrhea, Nausea And Vomiting, Nausea Only   Aspirin Other (See Comments)   Burning. Burns stomach   Ofloxacin Nausea Only   Oxycodone Hcl    Weird dreams   Simvastatin    GI upset   Sulfa Antibiotics Hives, Itching   Adhesive [tape] Other (See Comments)   Blisters skin      Medication List       Accurate as of 11/25/16  2:45 PM. Always use your most recent med list.          Butalbital-APAP-Caff-Cod 50-300-40-30 MG Caps Take 1 tablet by mouth 4 (four) times daily as needed. For migraines.   CALTRATE 600+D3 SOFT PO Take 1 tablet  by mouth daily.   docusate sodium 100 MG capsule Commonly known as:  COLACE Take 1 capsule (100 mg total) by mouth 2 (two) times daily.   HYDROcodone-acetaminophen 5-325 MG tablet Commonly known as:  NORCO/VICODIN Take 1-2 tablets by mouth every 6 (six) hours as needed for moderate pain.   lovastatin 20 MG tablet Commonly known as:  MEVACOR Take 1 tablet (20 mg total) by mouth at bedtime.   MEGARED OMEGA-3 KRILL OIL PO Take 1 tablet by mouth daily.   ondansetron 4 MG disintegrating tablet Commonly known as:  ZOFRAN ODT Take 1 tablet (4 mg total) by mouth every 8 (eight) hours as needed for nausea or vomiting.   oxybutynin 5 MG tablet Commonly known  as:  DITROPAN Take 1 tablet (5 mg total) by mouth every 8 (eight) hours as needed for bladder spasms.   oxyCODONE 5 MG immediate release tablet Commonly known as:  Oxy IR/ROXICODONE Take 1 tablet (5 mg total) by mouth every 4 (four) hours as needed for moderate pain.   phentermine 37.5 MG tablet Commonly known as:  ADIPEX-P Take 1 tablet (37.5 mg total) by mouth daily before breakfast.   Turmeric 500 MG Caps Take 500 mg by mouth daily.   WOMENS MULTIVITAMIN PO Take 1 tablet by mouth daily.       Allergies:  Allergies  Allergen Reactions  . Tramadol Diarrhea, Nausea And Vomiting and Nausea Only  . Aspirin Other (See Comments)    Burning. Burns stomach  . Ofloxacin Nausea Only  . Oxycodone Hcl     Weird dreams  . Simvastatin     GI upset  . Sulfa Antibiotics Hives and Itching  . Adhesive [Tape] Other (See Comments)    Blisters skin    Family History: Family History  Problem Relation Age of Onset  . Hematuria Mother   . Diabetes type II Mother   . Hypertension Father   . Stomach cancer Father     Social History:  reports that she has never smoked. She has never used smokeless tobacco. She reports that she drinks alcohol. She reports that she does not use drugs.  ROS: UROLOGY Frequent Urination?: No Hard to postpone urination?: No Burning/pain with urination?: No Get up at night to urinate?: No Leakage of urine?: Yes Urine stream starts and stops?: No Trouble starting stream?: No Do you have to strain to urinate?: No Blood in urine?: No Urinary tract infection?: No Sexually transmitted disease?: No Injury to kidneys or bladder?: No Painful intercourse?: No Weak stream?: No Currently pregnant?: No Vaginal bleeding?: No Last menstrual period?: n  Gastrointestinal Nausea?: No Vomiting?: No Indigestion/heartburn?: No Diarrhea?: No Constipation?: No  Constitutional Fever: No Night sweats?: No Weight loss?: No Fatigue?: No  Skin Skin  rash/lesions?: No Itching?: No  Eyes Blurred vision?: No Double vision?: No  Ears/Nose/Throat Sore throat?: No Sinus problems?: Yes  Hematologic/Lymphatic Swollen glands?: No Easy bruising?: No  Cardiovascular Leg swelling?: No Chest pain?: No  Respiratory Cough?: No Shortness of breath?: No  Endocrine Excessive thirst?: No  Musculoskeletal Back pain?: No Joint pain?: No  Neurological Headaches?: No Dizziness?: No  Psychologic Depression?: No Anxiety?: No  Physical Exam: BP 134/77 (BP Location: Left Arm, Patient Position: Sitting, Cuff Size: Normal)   Pulse 82   Ht 5\' 5"  (1.651 m)   Wt 168 lb (76.2 kg)   BMI 27.96 kg/m   Constitutional:  Alert and oriented, No acute distress. HEENT: [place] AT, moist mucus membranes.  Trachea midline, no  masses. Cardiovascular: No clubbing, cyanosis, or edema. Respiratory: Normal respiratory effort, no increased work of breathing. GI: Abdomen is soft, nontender, nondistended, no abdominal masses GU: No CVA tenderness.  Skin: No rashes, bruises or suspicious lesions. Lymph: No cervical or inguinal adenopathy. Neurologic: Grossly intact, no focal deficits, moving all 4 extremities. Psychiatric: Normal mood and affect.  Laboratory Data: Lab Results  Component Value Date   WBC 19.7 (H) 08/31/2016   HGB 10.9 (L) 08/31/2016   HCT 32.3 (L) 08/31/2016   MCV 89.3 08/31/2016   PLT 149 (L) 08/31/2016    Lab Results  Component Value Date   CREATININE 0.87 08/31/2016    No results found for: PSA  No results found for: TESTOSTERONE  Lab Results  Component Value Date   HGBA1C 5.7 (H) 08/28/2016    Urinalysis    Component Value Date/Time   COLORURINE YELLOW (A) 08/28/2016 2217   APPEARANCEUR Clear 10/13/2016 1018   LABSPEC 1.013 08/28/2016 2217   PHURINE 5.0 08/28/2016 2217   GLUCOSEU Negative 10/13/2016 1018   HGBUR 2+ (A) 08/28/2016 2217   BILIRUBINUR Negative 10/13/2016 [place] 08/28/2016 2217     PROTEINUR 1+ (A) 10/13/2016 1018   PROTEINUR NEGATIVE 08/28/2016 2217   NITRITE Negative 10/13/2016 1018   NITRITE POSITIVE (A) 08/28/2016 2217   LEUKOCYTESUR 3+ (A) 10/13/2016 1018    Pertinent Imaging: No evidence of right hydronephrosis on post instrumentation imaging.  Assessment & Plan:    1. Nephrolithiasis I gave the patient the ABCs of stone formation. We discussed her stone analysis results. We also discussed ways to prevent future stone formation. We discussed repeat a 24 hour urine collection however she is not interested in that at this time. She has no other complaints at this time. She'll follow with [place] as needed.  Return if symptoms worsen or fail to improve.  Nickie Retort, MD  [facility] [address], [place], [place] [address] [phone]

## 2017-01-27 ENCOUNTER — Encounter: Payer: Self-pay | Admitting: Urology

## 2017-02-27 ENCOUNTER — Other Ambulatory Visit: Payer: Self-pay | Admitting: Family Medicine

## 2017-07-25 ENCOUNTER — Other Ambulatory Visit: Payer: Self-pay | Admitting: Family Medicine

## 2017-08-23 ENCOUNTER — Other Ambulatory Visit: Payer: Self-pay | Admitting: Family Medicine

## 2017-10-10 ENCOUNTER — Ambulatory Visit (INDEPENDENT_AMBULATORY_CARE_PROVIDER_SITE_OTHER): Payer: BLUE CROSS/BLUE SHIELD | Admitting: Family Medicine

## 2017-10-10 ENCOUNTER — Encounter: Payer: Self-pay | Admitting: Family Medicine

## 2017-10-10 VITALS — BP 130/88 | HR 80 | Temp 98.0°F | Resp 16 | Ht 65.0 in | Wt 179.8 lb

## 2017-10-10 DIAGNOSIS — Z114 Encounter for screening for human immunodeficiency virus [HIV]: Secondary | ICD-10-CM | POA: Diagnosis not present

## 2017-10-10 DIAGNOSIS — Z8669 Personal history of other diseases of the nervous system and sense organs: Secondary | ICD-10-CM

## 2017-10-10 DIAGNOSIS — Z1231 Encounter for screening mammogram for malignant neoplasm of breast: Secondary | ICD-10-CM

## 2017-10-10 DIAGNOSIS — Z0000 Encounter for general adult medical examination without abnormal findings: Secondary | ICD-10-CM | POA: Diagnosis not present

## 2017-10-10 DIAGNOSIS — Z13820 Encounter for screening for osteoporosis: Secondary | ICD-10-CM

## 2017-10-10 DIAGNOSIS — Z1239 Encounter for other screening for malignant neoplasm of breast: Secondary | ICD-10-CM

## 2017-10-10 DIAGNOSIS — Z23 Encounter for immunization: Secondary | ICD-10-CM | POA: Diagnosis not present

## 2017-10-10 DIAGNOSIS — Z124 Encounter for screening for malignant neoplasm of cervix: Secondary | ICD-10-CM | POA: Diagnosis not present

## 2017-10-10 NOTE — Progress Notes (Signed)
Patient: Monica Delacruz, Female    DOB: [birth year], 66 y.o.   MRN: [id number] Visit Date: 10/10/2017  Today's Provider: Vernie Murders, PA   Chief Complaint  Patient presents with  . Annual Exam   Subjective:    Annual physical exam Monica Delacruz is a 66 y.o. female who presents today for health maintenance and complete physical. She feels fairly well. She reports exercising none. She reports she is sleeping well.  -----------------------------------------------------------------   Review of Systems  Constitutional: Negative.   HENT: Negative.   Eyes: Negative.   Respiratory: Negative.   Cardiovascular: Negative.   Gastrointestinal: Negative.   Endocrine: Negative.   Genitourinary: Negative.   Musculoskeletal: Negative.   Skin: Negative.   Allergic/Immunologic: Negative.   Neurological: Negative.   Hematological: Negative.   Psychiatric/Behavioral: Negative.     Social History      She  reports that  has never smoked. she has never used smokeless tobacco. She reports that she drinks alcohol. She reports that she does not use drugs.       Social History   Socioeconomic History  . Marital status: Married    Spouse name: None  . Number of children: None  . Years of education: None  . Highest education level: None  Social Needs  . Financial resource strain: None  . Food insecurity - worry: None  . Food insecurity - inability: None  . Transportation needs - medical: None  . Transportation needs - non-medical: None  Occupational History  . None  Tobacco Use  . Smoking status: Never Smoker  . Smokeless tobacco: Never Used  Substance and Sexual Activity  . Alcohol use: Yes    Comment: wine rarely  . Drug use: No  . Sexual activity: Yes    Birth control/protection: Surgical  Other Topics Concern  . None  Social History Narrative  . None    Past Medical History:  Diagnosis Date  . Asthma    takes Singulair daily and Dulera prn  . Back pain      lumbar fracture  . Diverticulosis   . Headache    Migraine  . History of bronchitis    2014  . History of colon polyps   . History of kidney stones   . History of migraine    last one about 2wks ago  . Hyperlipidemia    takes Lovasatin daily  . Seasonal allergies    takes Claritin daily and Flonase daily   Patient Active Problem List   Diagnosis Date Noted  . Right ureteral stone 08/29/2016  . Abortion, spontaneous 08/23/2016  . Compression fracture 08/23/2016  . Esophagitis, reflux 08/23/2016  . Hemorrhoid 08/23/2016  . Arthritis, degenerative 08/23/2016  . RAD (reactive airway disease) 08/23/2016  . Hypercholesterolemia without hypertriglyceridemia 08/23/2016  . Neuralgia neuritis, sciatic nerve 08/23/2016  . Avitaminosis D 08/23/2016  . Low back pain 01/11/2010  . Herniation of nucleus pulposus 10/02/2009  . Osteopenia 10/02/2009   Past Surgical History:  Procedure Laterality Date  . ABDOMINAL HYSTERECTOMY  1985  . BACK SURGERY    . COLONOSCOPY    . CYSTOSCOPY    . CYSTOSCOPY W/ URETERAL STENT PLACEMENT Right 10/03/2016   Procedure: CYSTOSCOPY WITH STENT REPLACEMENT;  Surgeon: Hollice Espy, MD;  Location: [facility];  Service: Urology;  Laterality: Right;  . CYSTOSCOPY/RETROGRADE/URETEROSCOPY Right 08/29/2016   Procedure: CYSTOSCOPY/RETROGRADE/stent placement;  Surgeon: Alexis Frock, MD;  Location: [facility];  Service: Urology;  Laterality: Right;  .  FOOT SURGERY     bilateral  . JOINT REPLACEMENT Left 2009   knee  . LAMINECTOMY WITH POSTERIOR LATERAL ARTHRODESIS LEVEL 4 N/A 07/12/2013   Procedure: Thoracic Ten Through Lumbar Three posterior lateral fusion with instrumentation;  Surgeon: Winfield Cunas, MD;  Location: [facility];  Service: Neurosurgery;  Laterality: N/A;  Thoracic Ten Through Lumbar Three posterior lateral fusion with instrumentation  . LITHOTRIPSY     x 2  . NASAL SINUS SURGERY     x 2  . URETEROSCOPY WITH HOLMIUM LASER LITHOTRIPSY Right  10/03/2016   Procedure: URETEROSCOPY WITH HOLMIUM LASER LITHOTRIPSY;  Surgeon: Hollice Espy, MD;  Location: [facility];  Service: Urology;  Laterality: Right;   Family History        Family Status  Relation Name Status  . Mother  Deceased  . Father  Deceased        Her family history includes Diabetes type II in her mother; Hematuria in her mother; Hypertension in her father; Stomach cancer in her father.     Allergies  Allergen Reactions  . Tramadol Diarrhea, Nausea And Vomiting and Nausea Only    Other reaction(s): Vomiting  . Aspirin Other (See Comments)    Burning. Burns stomach Other reaction(s): Abdominal Pain, Other (See Comments) Burning.  . Ofloxacin Nausea Only  . Oxycodone Hcl     Weird dreams  . Simvastatin     GI upset  . Sulfa Antibiotics Hives, Itching and Swelling  . Adhesive [Tape] Other (See Comments)    Blisters skin  . Hydrocodone Itching    Current Outpatient Medications:  .  Butalbital-APAP-Caff-Cod 50-300-40-30 MG CAPS, Take 1 tablet by mouth 4 (four) times daily as needed. For migraines., Disp: 30 capsule, Rfl: 0 .  Calcium Carb-Cholecalciferol (CALTRATE 600+D3 SOFT PO), Take 1 tablet by mouth daily. , Disp: , Rfl:  .  lovastatin (MEVACOR) 20 MG tablet, TAKE 1 TABLET BY MOUTH EVERY NIGHT AT BEDTIME., Disp: 30 tablet, Rfl: 1 .  MEGARED OMEGA-3 KRILL OIL PO, Take 1 tablet by mouth daily. , Disp: , Rfl:  .  Multiple Vitamins-Minerals (WOMENS MULTIVITAMIN PO), Take 1 tablet by mouth daily. , Disp: , Rfl:  .  Turmeric 500 MG CAPS, Take 500 mg by mouth daily. , Disp: , Rfl:    Patient Care Team: Chrismon, Vickki Muff, PA as PCP - General (Family Medicine)     Objective:   Vitals: BP 130/88 (BP Location: Right Arm, Patient Position: Sitting, Cuff Size: Normal)   Pulse 80   Temp 98 F (36.7 C) (Oral)   Resp 16   Ht 5\' 5"  (1.651 m)   Wt 179 lb 12.8 oz (81.6 kg)   SpO2 97%   BMI 29.92 kg/m    Physical Exam  Constitutional: She is oriented to person,  place, and time. She appears well-developed and well-nourished.  HENT:  Head: Normocephalic and atraumatic.  Right Ear: External ear normal.  Left Ear: External ear normal.  Nose: Nose normal.  Mouth/Throat: Oropharynx is clear and moist.  Eyes: Conjunctivae and EOM are normal. Pupils are equal, round, and reactive to light. Right eye exhibits no discharge.  Neck: Normal range of motion. Neck supple. No tracheal deviation present. No thyromegaly present.  Cardiovascular: Normal rate, regular rhythm, normal heart sounds and intact distal pulses.  No murmur heard. Pulmonary/Chest: Effort normal and breath sounds normal. No respiratory distress. She has no wheezes. She has no rales. She exhibits no tenderness.  Abdominal: Soft. Bowel sounds  are normal. She exhibits no distension and no mass. There is no tenderness. There is no rebound and no guarding.  Genitourinary: Rectal exam shows guaiac negative stool. No vaginal discharge found.  Genitourinary Comments: History of abdominal hysterectomy in 1985 for DUB from fibroids.  Musculoskeletal: Normal range of motion. She exhibits no edema or tenderness.  Scars on left knee well healed from joint replacement in 2009. Well healed scars in back from laminectomy with fusion T10-L3.  Lymphadenopathy:    She has no cervical adenopathy.  Neurological: She is alert and oriented to person, place, and time. She has normal reflexes. No cranial nerve deficit. She exhibits normal muscle tone. Coordination normal.  Skin: Skin is warm and dry. No rash noted. No erythema.  Psychiatric: She has a normal mood and affect. Her behavior is normal. Judgment and thought content normal.    Depression Screen PHQ 2/9 Scores 10/10/2017 10/14/2016  PHQ - 2 Score 0 0  PHQ- 9 Score 0 -    Assessment & Plan:     Routine Health Maintenance and Physical Exam  Exercise Activities and Dietary recommendations   Work schedule has not allowed much exercise time. Encouraged  to drink extra fluids.    Immunization History  Administered Date(s) Administered  . Influenza,inj,Quad PF,6+ Mos 07/14/2013, 08/23/2016  . Pneumococcal Polysaccharide-23 08/30/2016  . Tdap 06/17/2011    Health Maintenance  Topic Date Due  . HIV Screening  07/06/1966  . MAMMOGRAM  09/09/2016  . INFLUENZA VACCINE  05/24/2017  . PAP SMEAR  06/24/2017  . PNA vac Low Risk Adult (1 of 2 - PCV13) 08/30/2017  . TETANUS/TDAP  06/16/2021  . COLONOSCOPY  08/04/2021  . DEXA SCAN  Completed  . Hepatitis C Screening  Completed   Discussed health benefits of physical activity, and encouraged her to engage in regular exercise appropriate for her age and condition.    -------------------------------------------------------------------- 1. Annual physical exam General health good. Will up date Prevnar and flu shots. Given anticipatory counseling. Recheck routine labs. - CBC with Differential/Platelet - Lipid panel - TSH - COMPLETE METABOLIC PANEL WITH GFR  2. Need for influenza vaccination - Flu vaccine HIGH DOSE PF  3. Breast cancer screening No masses, nipple discharge or local lymphadenopathy on physical exam. Schedule screening mammograms. - MM Digital Screening  4. 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 States she could not continue to take the Fosamax due to GERD worsening and tries to limit intake of calcium due to history of kidney stones. Will recheck BMD and labs. - DG Bone Density - CBC with Differential/Platelet - COMPLETE METABOLIC PANEL WITH GFR  5. Encounter for screening for HIV - HIV antibody  6. History of migraine Recent flare with increase in work stress due to a co-worker having a stroke and out of work. Uses Fiorinal with Codeine if she can't get the Aleve in early enough at onset of headache. Has not used #30 tablets of Fiorinal with Codeine the past year.  7. Pap smear for cervical cancer  screening Asymptomatic. History of abdominal hysterectomy (partial) for uterine fibroids.  - Pap IG (Image Guided)    Vernie Murders, PA  [facility]

## 2017-10-11 ENCOUNTER — Telehealth: Payer: Self-pay | Admitting: Family Medicine

## 2017-10-11 NOTE — Telephone Encounter (Signed)
Pt contacted office for refill request on the following medications:  Butalbital-APAP-Caff-Cod 50-300-40-30 MG CAPS   [facility]  Last Rx: 08/23/16  Pt stated she forgot to ask for a refill at her visit on 10/10/17. Pt was advised Simona Huh is out of the office today. Please advise. Thanks TNP

## 2017-10-11 NOTE — Telephone Encounter (Signed)
Please advise for Monica Delacruz as he is not here today. Thanks.

## 2017-10-11 NOTE — Telephone Encounter (Signed)
Will have to wait for Simona Huh to prescribe.

## 2017-10-11 NOTE — Telephone Encounter (Signed)
Please review. KW 

## 2017-10-12 ENCOUNTER — Other Ambulatory Visit: Payer: Self-pay | Admitting: Family Medicine

## 2017-10-12 DIAGNOSIS — G43109 Migraine with aura, not intractable, without status migrainosus: Secondary | ICD-10-CM

## 2017-10-12 MED ORDER — BUTALBITAL-APAP-CAFF-COD 50-300-40-30 MG PO CAPS: 1.0000 | ORAL_CAPSULE | Freq: Four times a day (QID) | ORAL | 0 refills | Status: DC | PRN

## 2017-10-12 NOTE — Telephone Encounter (Signed)
Left patient a voicemail advising her that RX is ready for pick up at the front desk.

## 2017-10-12 NOTE — Telephone Encounter (Signed)
Prescription written for pick up at the front desk.

## 2017-10-13 ENCOUNTER — Other Ambulatory Visit: Payer: Self-pay | Admitting: Family Medicine

## 2017-10-13 LAB — PAP IG (IMAGE GUIDED)

## 2017-10-19 ENCOUNTER — Encounter: Payer: Self-pay | Admitting: Physician Assistant

## 2017-10-19 ENCOUNTER — Ambulatory Visit (INDEPENDENT_AMBULATORY_CARE_PROVIDER_SITE_OTHER): Payer: BLUE CROSS/BLUE SHIELD | Admitting: Physician Assistant

## 2017-10-19 VITALS — BP 160/82 | HR 72 | Temp 97.9°F | Resp 16 | Wt 178.2 lb

## 2017-10-19 DIAGNOSIS — R002 Palpitations: Secondary | ICD-10-CM | POA: Diagnosis not present

## 2017-10-19 DIAGNOSIS — I10 Essential (primary) hypertension: Secondary | ICD-10-CM

## 2017-10-19 DIAGNOSIS — R5383 Other fatigue: Secondary | ICD-10-CM | POA: Diagnosis not present

## 2017-10-19 DIAGNOSIS — I493 Ventricular premature depolarization: Secondary | ICD-10-CM

## 2017-10-19 DIAGNOSIS — G43109 Migraine with aura, not intractable, without status migrainosus: Secondary | ICD-10-CM | POA: Diagnosis not present

## 2017-10-19 DIAGNOSIS — R300 Dysuria: Secondary | ICD-10-CM | POA: Diagnosis not present

## 2017-10-19 DIAGNOSIS — R0600 Dyspnea, unspecified: Secondary | ICD-10-CM

## 2017-10-19 DIAGNOSIS — R0609 Other forms of dyspnea: Secondary | ICD-10-CM

## 2017-10-19 LAB — POCT URINALYSIS DIPSTICK
Bilirubin, UA: NEGATIVE
Glucose, UA: NEGATIVE
Ketones, UA: NEGATIVE
Leukocytes, UA: NEGATIVE
Nitrite, UA: NEGATIVE
Protein, UA: NEGATIVE
Spec Grav, UA: 1.02 (ref 1.010–1.025)
Urobilinogen, UA: 0.2 E.U./dL
pH, UA: 6 (ref 5.0–8.0)

## 2017-10-19 MED ORDER — BUTALBITAL-APAP-CAFFEINE 50-325-40 MG PO TABS: 1.0000 | ORAL_TABLET | Freq: Four times a day (QID) | ORAL | 0 refills | Status: AC | PRN

## 2017-10-19 MED ORDER — METOPROLOL SUCCINATE ER 50 MG PO TB24: 50.0000 mg | ORAL_TABLET | Freq: Every day | ORAL | 3 refills | Status: DC

## 2017-10-19 NOTE — Patient Instructions (Signed)
Palpitations A palpitation is the feeling that your heartbeat is irregular or is faster than normal. It may feel like your heart is fluttering or skipping a beat. Palpitations are usually not a serious problem. They may be caused by many things, including smoking, caffeine, alcohol, stress, and certain medicines. Although most causes of palpitations are not serious, palpitations can be a sign of a serious medical problem. In some cases, you may need further medical evaluation. Follow these instructions at home: Pay attention to any changes in your symptoms. Take these actions to help with your condition:  Avoid the following: ? Caffeinated coffee, tea, soft drinks, diet pills, and energy drinks. ? Chocolate. ? Alcohol.  Do not use any tobacco products, such as cigarettes, chewing tobacco, and e-cigarettes. If you need help quitting, ask your health care provider.  Try to reduce your stress and anxiety. Things that can help you relax include: ? Yoga. ? Meditation. ? Physical activity, such as swimming, jogging, or walking. ? Biofeedback. This is a method that helps you learn to use your mind to control things in your body, such as your heartbeats.  Get plenty of rest and sleep.  Take over-the-counter and prescription medicines only as told by your health care provider.  Keep all follow-up visits as told by your health care provider. This is important.  Contact a health care provider if:  You continue to have a fast or irregular heartbeat after 24 hours.  Your palpitations occur more often. Get help right away if:  You have chest pain or shortness of breath.  You have a severe headache.  You feel dizzy or you faint. This information is not intended to replace advice given to you by your health care provider. Make sure you discuss any questions you have with your health care provider. Document Released: 10/07/2000 Document Revised: 03/14/2016 Document Reviewed: 06/25/2015 Elsevier  Interactive Patient Education  2018 [place]. Metoprolol extended-release tablets What is this medicine? METOPROLOL (me TOE proe lole) is a beta-blocker. Beta-blockers reduce the workload on the heart and help it to beat more regularly. This medicine is used to treat high blood pressure and to prevent chest pain. It is also used to after a heart attack and to prevent an additional heart attack from occurring. This medicine may be used for other purposes; ask your health care provider or pharmacist if you have questions. COMMON BRAND NAME(S): toprol, Toprol XL What should I tell my health care provider before I take this medicine? They need to know if you have any of these conditions: -diabetes -heart or vessel disease like slow heart rate, worsening heart failure, heart block, sick sinus syndrome or Raynaud's disease -kidney disease -liver disease -lung or breathing disease, like asthma or emphysema -pheochromocytoma -thyroid disease -an unusual or allergic reaction to metoprolol, other beta-blockers, medicines, foods, dyes, or preservatives -pregnant or trying to get pregnant -breast-feeding How should I use this medicine? Take this medicine by mouth with a glass of water. Follow the directions on the prescription label. Do not crush or chew. Take this medicine with or immediately after meals. Take your doses at regular intervals. Do not take more medicine than directed. Do not stop taking this medicine suddenly. This could lead to serious heart-related effects. Talk to your pediatrician regarding the use of this medicine in children. While this drug may be prescribed for children as young as 6 years for selected conditions, precautions do apply. Overdosage: If you think you have taken too much of this  medicine contact a poison control center or emergency room at once. NOTE: This medicine is only for you. Do not share this medicine with others. What if I miss a dose? If you miss a dose,  take it as soon as you can. If it is almost time for your next dose, take only that dose. Do not take double or extra doses. What may interact with this medicine? This medicine may interact with the following medications: -certain medicines for blood pressure, heart disease, irregular heart beat -certain medicines for depression, like monoamine oxidase (MAO) inhibitors, fluoxetine, or paroxetine -clonidine -dobutamine -epinephrine -isoproterenol -reserpine This list may not describe all possible interactions. Give your health care provider a list of all the medicines, herbs, non-prescription drugs, or dietary supplements you use. Also tell them if you smoke, drink alcohol, or use illegal drugs. Some items may interact with your medicine. What should I watch for while using this medicine? Visit your doctor or health care professional for regular check ups. Contact your doctor right away if your symptoms worsen. Check your blood pressure and pulse rate regularly. Ask your health care professional what your blood pressure and pulse rate should be, and when you should contact them. You may get drowsy or dizzy. Do not drive, use machinery, or do anything that needs mental alertness until you know how this medicine affects you. Do not sit or stand up quickly, especially if you are an older patient. This reduces the risk of dizzy or fainting spells. Contact your doctor if these symptoms continue. Alcohol may interfere with the effect of this medicine. Avoid alcoholic drinks. What side effects may I notice from receiving this medicine? Side effects that you should report to your doctor or health care professional as soon as possible: -allergic reactions like skin rash, itching or hives -cold or numb hands or feet -depression -difficulty breathing -faint -fever with sore throat -irregular heartbeat, chest pain -rapid weight gain -swollen legs or ankles Side effects that usually do not require medical  attention (report to your doctor or health care professional if they continue or are bothersome): -anxiety or nervousness -change in sex drive or performance -dry skin -headache -nightmares or trouble sleeping -short term memory loss -stomach upset or diarrhea -unusually tired This list may not describe all possible side effects. Call your doctor for medical advice about side effects. You may report side effects to FDA at 1-800-FDA-1088. Where should I keep my medicine? Keep out of the reach of children. Store at room temperature between 15 and 30 degrees C (59 and 86 degrees F). Throw away any unused medicine after the expiration date. NOTE: This sheet is a summary. It may not cover all possible information. If you have questions about this medicine, talk to your doctor, pharmacist, or health care provider.  2018 Elsevier/Gold Standard (2013-06-14 14:41:37)

## 2017-10-19 NOTE — Progress Notes (Signed)
Patient: Monica Delacruz Female    DOB: 05-19-1951   66 y.o.   MRN: [id number] Visit Date: 10/19/2017  Today's Provider: Mar Daring, PA-C   Chief Complaint  Patient presents with  . Urinary Tract Infection   Subjective:    Urinary Tract Infection   This is a new problem. The current episode started 1 to 4 weeks ago. The problem has been unchanged. The patient is experiencing no pain. There has been no fever. Associated symptoms include flank pain, frequency, nausea and urgency. Pertinent negatives include no chills or hematuria. She has tried nothing for the symptoms. Her past medical history is significant for kidney stones.   Fatigue: Patient complains of fatigue. Symptoms began several weeks ago. Sentinal symptom the patient feels fatigue began with: none. Symptoms of her fatigue have been with SOB while going up the stairs. Heart palpitations in the last week. Patient describes the following psychologic symptoms: stress at work.  Patient denies fever, significant change in weight, symptoms of arthritis, unusual rashes and cold intolerance, constipation and change in hair texture.. Symptoms have stabilized. Severity has been symptoms bothersome, but easily able to carry out all usual work/school/family activities. Previous visits for this problem: none.     Allergies  Allergen Reactions  . Tramadol Diarrhea, Nausea And Vomiting and Nausea Only    Other reaction(s): Vomiting  . Aspirin Other (See Comments)    Burning. Burns stomach Other reaction(s): Abdominal Pain, Other (See Comments) Burning.  . Ofloxacin Nausea Only  . Oxycodone Hcl     Weird dreams  . Simvastatin     GI upset  . Sulfa Antibiotics Hives, Itching and Swelling  . Adhesive [Tape] Other (See Comments)    Blisters skin  . Hydrocodone Itching     Current Outpatient Medications:  .  Butalbital-APAP-Caff-Cod 50-300-40-30 MG CAPS, Take 1 tablet by mouth 4 (four) times daily as needed. For  migraines., Disp: 30 capsule, Rfl: 0 .  Calcium Carb-Cholecalciferol (CALTRATE 600+D3 SOFT PO), Take 1 tablet by mouth daily. , Disp: , Rfl:  .  lovastatin (MEVACOR) 20 MG tablet, TAKE 1 TABLET BY MOUTH EVERY NIGHT AT BEDTIME, Disp: 30 tablet, Rfl: 6 .  MEGARED OMEGA-3 KRILL OIL PO, Take 1 tablet by mouth daily. , Disp: , Rfl:  .  Multiple Vitamins-Minerals (WOMENS MULTIVITAMIN PO), Take 1 tablet by mouth daily. , Disp: , Rfl:  .  Turmeric 500 MG CAPS, Take 500 mg by mouth daily. , Disp: , Rfl:   Review of Systems  Constitutional: Positive for fatigue. Negative for chills and fever.  HENT: Negative.   Respiratory: Positive for shortness of breath. Negative for chest tightness and wheezing.   Cardiovascular: Positive for palpitations and leg swelling (left ankle swelling). Negative for chest pain.  Gastrointestinal: Positive for nausea. Negative for abdominal pain.  Genitourinary: Positive for flank pain, frequency and urgency. Negative for dysuria and hematuria.  Neurological: Negative for dizziness, light-headedness and headaches.    Social History   Tobacco Use  . Smoking status: Never Smoker  . Smokeless tobacco: Never Used  Substance Use Topics  . Alcohol use: Yes    Comment: wine rarely   Objective:   BP (!) 160/82 (BP Location: Left Arm, Patient Position: Sitting, Cuff Size: Normal)   Pulse 72   Temp 97.9 F (36.6 C) (Oral)   Resp 16   Wt 178 lb 3.2 oz (80.8 kg)   SpO2 97%   BMI 29.65 kg/m  Physical Exam  Constitutional: She is oriented to person, place, and time. She appears well-developed and well-nourished. No distress.  Cardiovascular: Normal rate, regular rhythm and normal heart sounds. Exam reveals no gallop and no friction rub.  No murmur heard. Pulmonary/Chest: Effort normal and breath sounds normal. No respiratory distress. She has no wheezes. She has no rales.  Abdominal: Soft. Normal appearance and bowel sounds are normal. She exhibits no distension and  no mass. There is no hepatosplenomegaly. There is no tenderness. There is no rebound, no guarding and no CVA tenderness.  Neurological: She is alert and oriented to person, place, and time.  Skin: Skin is warm and dry. She is not diaphoretic.  Vitals reviewed.      Assessment & Plan:     1. Dysuria UA normal today.  - POCT urinalysis dipstick  2. Heart palpitations EKG normal but was reported to have had what appeared to be a PVC prior to captured reading. Patient also felt this at that time because she stopped and took a deep breath at that time as well. When asked she said she felt her "heart jump". I will still refer to cardiology as below for palpitations, DOE and fatigue. I feel patient may benefit from exercise stress testing but would appreciate cardiology evaluation. I will also start metoprolol ER 50mg  as below for elevated BP and palpitations. Will not use propanolol due to patient having reactive airway disease. Patient is also due for labs. She had annual labs ordered on 10/10/17. Advised patient to proceed with obtaining these labs fasting asap. I would like for her to f/u in 4 weeks to recheck BP and see how she is feeling at that time. She is to call if symptoms worsen. - EKG 12-Lead - Ambulatory referral to Cardiology  3. Fatigue, unspecified type See above medical treatment plan. - EKG 12-Lead - Ambulatory referral to Cardiology  4. Essential hypertension See above medical treatment plan. - metoprolol succinate (TOPROL-XL) 50 MG 24 hr tablet; Take 1 tablet (50 mg total) by mouth daily. Take with or immediately following a meal.  Dispense: 30 tablet; Refill: 3 - Ambulatory referral to Cardiology  5. PVC (premature ventricular contraction) See above medical treatment plan. - Ambulatory referral to Cardiology  6. DOE (dyspnea on exertion) See above medical treatment plan. - Ambulatory referral to Cardiology  7. Migraine with aura and without status migrainosus, not  intractable Insurance did not cover fioricet with codeine. New Rx given to patient for fioricet without codeine as below.  - butalbital-acetaminophen-caffeine (FIORICET, ESGIC) 50-325-40 MG tablet; Take 1 tablet by mouth every 6 (six) hours as needed for migraine.  Dispense: 30 tablet; Refill: 0       Mar Daring, PA-C  [facility]

## 2017-10-21 LAB — CBC WITH DIFFERENTIAL/PLATELET
Basophils Absolute: 57 cells/uL (ref 0–200)
Basophils Relative: 1 %
Eosinophils Absolute: 68 cells/uL (ref 15–500)
Eosinophils Relative: 1.2 %
HCT: 39.4 % (ref 35.0–45.0)
Hemoglobin: 13.5 g/dL (ref 11.7–15.5)
Lymphs Abs: 2075 cells/uL (ref 850–3900)
MCH: 30.2 pg (ref 27.0–33.0)
MCHC: 34.3 g/dL (ref 32.0–36.0)
MCV: 88.1 fL (ref 80.0–100.0)
MPV: 10.3 fL (ref 7.5–12.5)
Monocytes Relative: 8.4 %
Neutro Abs: 3021 cells/uL (ref 1500–7800)
Neutrophils Relative %: 53 %
Platelets: 267 10*3/uL (ref 140–400)
RBC: 4.47 10*6/uL (ref 3.80–5.10)
RDW: 12.4 % (ref 11.0–15.0)
Total Lymphocyte: 36.4 %
WBC mixed population: 479 cells/uL (ref 200–950)
WBC: 5.7 10*3/uL (ref 3.8–10.8)

## 2017-10-21 LAB — LIPID PANEL
Cholesterol: 195 mg/dL (ref <200)
HDL: 73 mg/dL (ref >50)
LDL Cholesterol (Calc): 108 mg/dL (calc) — ABNORMAL HIGH
Non-HDL Cholesterol (Calc): 122 mg/dL (calc) (ref <130)
Total CHOL/HDL Ratio: 2.7 (calc) (ref <5.0)
Triglycerides: 56 mg/dL (ref <150)

## 2017-10-21 LAB — COMPLETE METABOLIC PANEL WITH GFR
AG Ratio: 2 (calc) (ref 1.0–2.5)
ALT: 29 U/L (ref 6–29)
AST: 26 U/L (ref 10–35)
Albumin: 4.2 g/dL (ref 3.6–5.1)
Alkaline phosphatase (APISO): 69 U/L (ref 33–130)
BUN: 15 mg/dL (ref 7–25)
CO2: 24 mmol/L (ref 20–32)
Calcium: 9.2 mg/dL (ref 8.6–10.4)
Chloride: 109 mmol/L (ref 98–110)
Creat: 0.87 mg/dL (ref 0.50–0.99)
GFR, Est African American: 81 mL/min/{1.73_m2} (ref >=60)
GFR, Est Non African American: 70 mL/min/{1.73_m2} (ref >=60)
Globulin: 2.1 g/dL (calc) (ref 1.9–3.7)
Glucose, Bld: 102 mg/dL — ABNORMAL HIGH (ref 65–99)
Potassium: 4.2 mmol/L (ref 3.5–5.3)
Sodium: 141 mmol/L (ref 135–146)
Total Bilirubin: 0.3 mg/dL (ref 0.2–1.2)
Total Protein: 6.3 g/dL (ref 6.1–8.1)

## 2017-10-21 LAB — HIV ANTIBODY (ROUTINE TESTING W REFLEX): HIV 1&2 Ab, 4th Generation: NONREACTIVE

## 2017-10-21 LAB — TSH: TSH: 2.85 mIU/L (ref 0.40–4.50)

## 2017-10-27 ENCOUNTER — Encounter: Payer: Self-pay | Admitting: Family Medicine

## 2017-11-06 ENCOUNTER — Ambulatory Visit
Admission: RE | Admit: 2017-11-06 | Discharge: 2017-11-06 | Disposition: A | Discharge status: Home / Self Care | Payer: BLUE CROSS/BLUE SHIELD | Source: Ambulatory Visit | Attending: Family Medicine | Admitting: Family Medicine

## 2017-11-06 ENCOUNTER — Telehealth: Payer: Self-pay

## 2017-11-06 DIAGNOSIS — Z1231 Encounter for screening mammogram for malignant neoplasm of breast: Secondary | ICD-10-CM | POA: Diagnosis present

## 2017-11-06 NOTE — Telephone Encounter (Signed)
-----   Message from Margo Common, [place] sent at 11/06/2017  2:13 PM EST ----- Normal mammogram. No sign of cancer. Recheck mammograms in a year.

## 2017-11-06 NOTE — Telephone Encounter (Signed)
LMTCB

## 2017-11-06 NOTE — Telephone Encounter (Signed)
Patient advised as directed below.  Thanks,  -Joseline 

## 2017-11-16 ENCOUNTER — Ambulatory Visit
Admission: RE | Admit: 2017-11-16 | Discharge: 2017-11-16 | Disposition: A | Discharge status: Home / Self Care | Payer: BLUE CROSS/BLUE SHIELD | Source: Ambulatory Visit | Attending: Family Medicine | Admitting: Family Medicine

## 2017-11-16 DIAGNOSIS — Z780 Asymptomatic menopausal state: Secondary | ICD-10-CM | POA: Insufficient documentation

## 2017-11-16 DIAGNOSIS — M85852 Other specified disorders of bone density and structure, left thigh: Secondary | ICD-10-CM | POA: Insufficient documentation

## 2017-11-16 DIAGNOSIS — Z13820 Encounter for screening for osteoporosis: Secondary | ICD-10-CM | POA: Insufficient documentation

## 2017-11-17 ENCOUNTER — Ambulatory Visit: Payer: Self-pay | Admitting: Physician Assistant

## 2017-11-17 NOTE — Progress Notes (Signed)
Advised  ED 

## 2018-02-27 ENCOUNTER — Telehealth: Payer: Self-pay | Admitting: Family Medicine

## 2018-02-27 NOTE — Telephone Encounter (Signed)
Pt called wanting to know if she has had a tdap vaccine.  She has a new baby in the family and they told her she needs to make sure she has been vaccinated,  Pt's call back is [phone]  Thanks teri

## 2018-02-27 NOTE — Telephone Encounter (Signed)
Patient had a Tdap on 06/17/2011. Patient advised.

## 2018-05-11 ENCOUNTER — Other Ambulatory Visit: Payer: Self-pay | Admitting: Family Medicine

## 2018-08-20 ENCOUNTER — Other Ambulatory Visit: Payer: Self-pay | Admitting: Family Medicine

## 2018-08-20 NOTE — Telephone Encounter (Signed)
See refill request.

## 2018-12-03 ENCOUNTER — Other Ambulatory Visit: Payer: Self-pay | Admitting: Family Medicine

## 2019-05-07 DIAGNOSIS — H524 Presbyopia: Secondary | ICD-10-CM | POA: Diagnosis not present

## 2019-06-03 DIAGNOSIS — H524 Presbyopia: Secondary | ICD-10-CM | POA: Diagnosis not present

## 2019-08-20 ENCOUNTER — Encounter: Payer: Self-pay | Admitting: Family Medicine

## 2019-08-20 ENCOUNTER — Other Ambulatory Visit: Payer: Self-pay

## 2019-08-20 ENCOUNTER — Ambulatory Visit (INDEPENDENT_AMBULATORY_CARE_PROVIDER_SITE_OTHER): Payer: Medicare Other | Admitting: Family Medicine

## 2019-08-20 VITALS — BP 142/76 | HR 84 | Temp 97.1°F | Ht 65.0 in | Wt 177.0 lb

## 2019-08-20 DIAGNOSIS — Z23 Encounter for immunization: Secondary | ICD-10-CM | POA: Diagnosis not present

## 2019-08-20 DIAGNOSIS — Z0000 Encounter for general adult medical examination without abnormal findings: Secondary | ICD-10-CM | POA: Diagnosis not present

## 2019-08-20 DIAGNOSIS — Z1231 Encounter for screening mammogram for malignant neoplasm of breast: Secondary | ICD-10-CM

## 2019-08-20 DIAGNOSIS — E7800 Pure hypercholesterolemia, unspecified: Secondary | ICD-10-CM | POA: Diagnosis not present

## 2019-08-20 DIAGNOSIS — G43109 Migraine with aura, not intractable, without status migrainosus: Secondary | ICD-10-CM

## 2019-08-20 NOTE — Progress Notes (Signed)
Patient: Monica Delacruz, Female    DOB: 1951-02-15, 68 y.o.   MRN: [id number] Visit Date: 08/20/2019  Today's Provider: Vernie Murders, PA   Chief Complaint  Patient presents with  . Annual Exam   Subjective:  Monica Delacruz is a 68 y.o. female who presents today for health maintenance and complete physical. She feels well. She reports exercising none. She reports she is sleeping well.  Immunization History  Administered Date(s) Administered  . Fluad Quad(high Dose 65+) 08/20/2019  . Influenza, High Dose Seasonal PF 10/10/2017  . Influenza,inj,Quad PF,6+ Mos 07/14/2013, 08/23/2016  . Pneumococcal Conjugate-13 10/10/2017  . Pneumococcal Polysaccharide-23 08/30/2016  . Tdap 06/17/2011   08/05/11 Colonoscopy 11/16/17 BMD 11/06/17 Mammogram 10/10/17 Pap Smear  Review of Systems  Constitutional: Negative.   HENT: Negative.   Eyes: Negative.   Respiratory: Negative.   Cardiovascular: Negative.   Gastrointestinal: Negative.   Endocrine: Negative.   Genitourinary: Negative.   Musculoskeletal: Negative.   Skin: Negative.   Allergic/Immunologic: Negative.   Neurological: Negative.   Hematological: Negative.   Psychiatric/Behavioral: Negative.     Social History   Socioeconomic History  . Marital status: Married    Spouse name: Not on file  . Number of children: Not on file  . Years of education: Not on file  . Highest education level: Not on file  Occupational History  . Not on file  Social Needs  . Financial resource strain: Not on file  . Food insecurity    Worry: Not on file    Inability: Not on file  . Transportation needs    Medical: Not on file    Non-medical: Not on file  Tobacco Use  . Smoking status: Never Smoker  . Smokeless tobacco: Never Used  Substance and Sexual Activity  . Alcohol use: Yes    Comment: wine rarely  . Drug use: No  . Sexual activity: Yes    Birth control/protection: Surgical  Lifestyle  . Physical activity    Days per week: Not  on file    Minutes per session: Not on file  . Stress: Not on file  Relationships  . Social Herbalist on phone: Not on file    Gets together: Not on file    Attends religious service: Not on file    Active member of club or organization: Not on file    Attends meetings of clubs or organizations: Not on file    Relationship status: Not on file  . Intimate partner violence    Fear of current or ex partner: Not on file    Emotionally abused: Not on file    Physically abused: Not on file    Forced sexual activity: Not on file  Other Topics Concern  . Not on file  Social History Narrative  . Not on file   Patient Active Problem List   Diagnosis Date Noted  . Migraine with aura and without status migrainosus, not intractable 10/19/2017  . Right ureteral stone 08/29/2016  . Abortion, spontaneous 08/23/2016  . Compression fracture 08/23/2016  . Esophagitis, reflux 08/23/2016  . Hemorrhoid 08/23/2016  . Arthritis, degenerative 08/23/2016  . RAD (reactive airway disease) 08/23/2016  . Hypercholesterolemia without hypertriglyceridemia 08/23/2016  . Neuralgia neuritis, sciatic nerve 08/23/2016  . Avitaminosis D 08/23/2016  . Low back pain 01/11/2010  . Herniation of nucleus pulposus 10/02/2009  . Osteopenia 10/02/2009   Past Surgical History:  Procedure Laterality Date  . ABDOMINAL HYSTERECTOMY  1985  .  BACK SURGERY    . COLONOSCOPY    . CYSTOSCOPY    . CYSTOSCOPY W/ URETERAL STENT PLACEMENT Right 10/03/2016   Procedure: CYSTOSCOPY WITH STENT REPLACEMENT;  Surgeon: Hollice Espy, MD;  Location: [facility];  Service: Urology;  Laterality: Right;  . CYSTOSCOPY/RETROGRADE/URETEROSCOPY Right 08/29/2016   Procedure: CYSTOSCOPY/RETROGRADE/stent placement;  Surgeon: Alexis Frock, MD;  Location: [facility];  Service: Urology;  Laterality: Right;  . FOOT SURGERY     bilateral  . JOINT REPLACEMENT Left 2009   knee  . LAMINECTOMY WITH POSTERIOR LATERAL ARTHRODESIS LEVEL 4 N/A  07/12/2013   Procedure: Thoracic Ten Through Lumbar Three posterior lateral fusion with instrumentation;  Surgeon: Winfield Cunas, MD;  Location: [place] [facility];  Service: Neurosurgery;  Laterality: N/A;  Thoracic Ten Through Lumbar Three posterior lateral fusion with instrumentation  . LITHOTRIPSY     x 2  . NASAL SINUS SURGERY     x 2  . URETEROSCOPY WITH HOLMIUM LASER LITHOTRIPSY Right 10/03/2016   Procedure: URETEROSCOPY WITH HOLMIUM LASER LITHOTRIPSY;  Surgeon: Hollice Espy, MD;  Location: [facility];  Service: Urology;  Laterality: Right;    Her family history includes Diabetes type II in her mother; Hematuria in her mother; Hypertension in her father; Stomach cancer in her father.     Allergies  Allergen Reactions  . Tramadol Diarrhea, Nausea And Vomiting and Nausea Only    Other reaction(s): Vomiting  . Aspirin Other (See Comments)    Burning. Burns stomach Other reaction(s): Abdominal Pain, Other (See Comments) Burning.  . Ofloxacin Nausea Only  . Oxycodone Hcl     Weird dreams  . Simvastatin     GI upset  . Sulfa Antibiotics Hives, Itching and Swelling  . Adhesive [Tape] Other (See Comments)    Blisters skin  . Hydrocodone Itching    Outpatient Encounter Medications as of 08/20/2019  Medication Sig  . MEGARED OMEGA-3 KRILL OIL PO Take 1 tablet by mouth daily.   Marland Kitchen lovastatin (MEVACOR) 20 MG tablet TAKE 1 TABLET BY MOUTH EVERY NIGHT AT BEDTIME (Patient not taking: Reported on 08/20/2019)  . metoprolol succinate (TOPROL-XL) 50 MG 24 hr tablet Take 1 tablet (50 mg total) by mouth daily. Take with or immediately following a meal. (Patient not taking: Reported on 08/20/2019)  . [DISCONTINUED] Calcium Carb-Cholecalciferol (CALTRATE 600+D3 SOFT PO) Take 1 tablet by mouth daily.   . [DISCONTINUED] Multiple Vitamins-Minerals (WOMENS MULTIVITAMIN PO) Take 1 tablet by mouth daily.   . [DISCONTINUED] Turmeric 500 MG CAPS Take 500 mg by mouth daily.    No facility-administered  encounter medications on file as of 08/20/2019.    Patient Care Team: Nyema Hachey, Vickki Muff, PA as PCP - General (Family Medicine)     Objective:   Vitals:  Vitals:   08/20/19 1059  BP: (!) 142/76  Pulse: 84  Temp: (!) 97.1 F (36.2 C)  TempSrc: Skin  SpO2: 99%  Weight: 177 lb (80.3 kg)  Height: 5\' 5"  (1.651 m)   Wt Readings from Last 3 Encounters:  08/20/19 177 lb (80.3 kg)  10/19/17 178 lb 3.2 oz (80.8 kg)  10/10/17 179 lb 12.8 oz (81.6 kg)   Physical Exam Constitutional:      Appearance: She is well-developed.  HENT:     Head: Normocephalic and atraumatic.     Right Ear: External ear normal.     Left Ear: External ear normal.     Nose: Nose normal.  Eyes:     General:  Right eye: No discharge.     Conjunctiva/sclera: Conjunctivae normal.     Pupils: Pupils are equal, round, and reactive to light.  Neck:     Musculoskeletal: Normal range of motion and neck supple.     Thyroid: No thyromegaly.     Trachea: No tracheal deviation.  Cardiovascular:     Rate and Rhythm: Normal rate and regular rhythm.     Heart sounds: Normal heart sounds. No murmur.  Pulmonary:     Effort: Pulmonary effort is normal. No respiratory distress.     Breath sounds: Normal breath sounds. No wheezing or rales.  Chest:     Chest wall: No tenderness.  Abdominal:     General: There is no distension.     Palpations: Abdomen is soft. There is no mass.     Tenderness: There is no abdominal tenderness. There is no guarding or rebound.  Genitourinary:    Comments: Deferred exam. History of abdominal hysterectomy in 1985 for DUB from fibroids.  Musculoskeletal: Normal range of motion.        General: No tenderness.     Comments: Scars on left knee well healed from joint replacement in 2009. Well healed scars in back from laminectomy with fusion T10-L3 by Dr. Christella Noa in 2014.   Lymphadenopathy:     Cervical: No cervical adenopathy.  Skin:    General: Skin is warm and dry.     Findings:  No erythema or rash.  Neurological:     Mental Status: She is alert and oriented to person, place, and time.     Cranial Nerves: No cranial nerve deficit.     Motor: No abnormal muscle tone.     Coordination: Coordination normal.     Deep Tendon Reflexes: Reflexes are normal and symmetric. Reflexes normal.  Psychiatric:        Behavior: Behavior normal.        Thought Content: Thought content normal.        Judgment: Judgment normal.     Depression Screen PHQ 2/9 Scores 08/20/2019 10/10/2017 10/14/2016  PHQ - 2 Score 0 0 0  PHQ- 9 Score - 0 -   Fall Risk  08/20/2019 10/10/2017 10/14/2016  Falls in the past year? 0 No No  Number falls in past yr: 0 - -  Injury with Fall? 0 - -   Current Exercise Habits: The patient does not participate in regular exercise at present   .Functional Status Survey: Is the patient deaf or have difficulty hearing?: No Does the patient have difficulty seeing, even when wearing glasses/contacts?: No Does the patient have difficulty concentrating, remembering, or making decisions?: No Does the patient have difficulty walking or climbing stairs?: No Does the patient have difficulty dressing or bathing?: No Does the patient have difficulty doing errands alone such as visiting a doctor's office or shopping?: No    Office Visit from 08/20/2019 in [facility]  AUDIT-C Score  1     Assessment & Plan:     Routine Health Maintenance and Physical Exam  Exercise Activities and Dietary recommendations Goals   None    Immunization History  Administered Date(s) Administered  . Fluad Quad(high Dose 65+) 08/20/2019  . Influenza, High Dose Seasonal PF 10/10/2017  . Influenza,inj,Quad PF,6+ Mos 07/14/2013, 08/23/2016  . Pneumococcal Conjugate-13 10/10/2017  . Pneumococcal Polysaccharide-23 08/30/2016  . Tdap 06/17/2011   Health Maintenance  Topic Date Due  . INFLUENZA VACCINE  05/25/2019  . MAMMOGRAM  11/07/2019  . TETANUS/TDAP  06/16/2021  . COLONOSCOPY  08/04/2021  . PNA vac Low Risk Adult (2 of 2 - PPSV23) 08/30/2021  . DEXA SCAN  Completed  . Hepatitis C Screening  Completed   Discussed health benefits of physical activity, and encouraged her to engage in regular exercise appropriate for her age and condition.   1. Annual physical exam General health stable. Immunizations up to date except need for flu shot today. Given anticipatory counseling. Check CBC, CMP, Lipid Panel and TSH today. - CBC with Differential/Platelet - Comprehensive metabolic panel - Lipid panel - TSH  2. Migraine with aura and without status migrainosus, not intractable Having frontal pounding headache that radiates around head to neck. Some nausea and vomiting when they occur. Advil helps to alleviate headache if taken early in the process. Usually has 1-2 migraines a month that last a day. Last one was 1.5 weeks ago. Seem to be occurring less frequently since she retired and has less stress. Check routine labs and follow up prn. - CBC with Differential/Platelet - Comprehensive metabolic panel - TSH  3. Hypercholesterolemia without hypertriglyceridemia No longer taking the Lovastatin. Just using low fat diet and MegaRed Krill Oil qd. Recheck CMP, Lipid Panel and TSH.   Lipid Panel     Component Value Date/Time   CHOL 195 10/20/2017 0808   CHOL 282 (H) 08/23/2016 1219   TRIG 56 10/20/2017 0808   HDL 73 10/20/2017 0808   HDL 68 08/23/2016 1219   CHOLHDL 2.7 10/20/2017 0808   LDLCALC 108 (H) 10/20/2017 0808   LABVLDL 22 08/23/2016 1219   - Comprehensive metabolic panel - Lipid panel - TSH  4. Need for influenza vaccination - Flu Vaccine QUAD High Dose(Fluad)  5. Encounter for screening mammogram for malignant neoplasm of breast Denies mass on SBE at home. Family history of positive for breast cancer in maternal grandmother. Requests screening mammograms despite history of normal reports in the past 10 years. - MM Digital  Screening

## 2019-08-22 DIAGNOSIS — E7800 Pure hypercholesterolemia, unspecified: Secondary | ICD-10-CM | POA: Diagnosis not present

## 2019-08-22 DIAGNOSIS — Z0000 Encounter for general adult medical examination without abnormal findings: Secondary | ICD-10-CM | POA: Diagnosis not present

## 2019-08-22 DIAGNOSIS — G43109 Migraine with aura, not intractable, without status migrainosus: Secondary | ICD-10-CM | POA: Diagnosis not present

## 2019-08-23 ENCOUNTER — Telehealth: Payer: Self-pay

## 2019-08-23 LAB — CBC WITH DIFFERENTIAL/PLATELET
Basophils Absolute: 0.1 10*3/uL (ref 0.0–0.2)
Basos: 1 %
EOS (ABSOLUTE): 0.1 10*3/uL (ref 0.0–0.4)
Eos: 2 %
Hematocrit: 42.6 % (ref 34.0–46.6)
Hemoglobin: 13.9 g/dL (ref 11.1–15.9)
Immature Grans (Abs): 0 10*3/uL (ref 0.0–0.1)
Immature Granulocytes: 0 %
Lymphocytes Absolute: 1.7 10*3/uL (ref 0.7–3.1)
Lymphs: 36 %
MCH: 29.7 pg (ref 26.6–33.0)
MCHC: 32.6 g/dL (ref 31.5–35.7)
MCV: 91 fL (ref 79–97)
Monocytes Absolute: 0.6 10*3/uL (ref 0.1–0.9)
Monocytes: 12 %
Neutrophils Absolute: 2.4 10*3/uL (ref 1.4–7.0)
Neutrophils: 49 %
Platelets: 260 10*3/uL (ref 150–450)
RBC: 4.68 x10E6/uL (ref 3.77–5.28)
RDW: 13.2 % (ref 11.7–15.4)
WBC: 4.8 10*3/uL (ref 3.4–10.8)

## 2019-08-23 LAB — LIPID PANEL
Chol/HDL Ratio: 3.9 ratio (ref 0.0–4.4)
Cholesterol, Total: 225 mg/dL — ABNORMAL HIGH (ref 100–199)
HDL: 58 mg/dL (ref >39)
LDL Chol Calc (NIH): 148 mg/dL — ABNORMAL HIGH (ref 0–99)
Triglycerides: 109 mg/dL (ref 0–149)
VLDL Cholesterol Cal: 19 mg/dL (ref 5–40)

## 2019-08-23 LAB — COMPREHENSIVE METABOLIC PANEL
ALT: 23 IU/L (ref 0–32)
AST: 23 IU/L (ref 0–40)
Albumin/Globulin Ratio: 2.1 (ref 1.2–2.2)
Albumin: 4.4 g/dL (ref 3.8–4.8)
Alkaline Phosphatase: 92 IU/L (ref 39–117)
BUN/Creatinine Ratio: 16 (ref 12–28)
BUN: 13 mg/dL (ref 8–27)
Bilirubin Total: 0.3 mg/dL (ref 0.0–1.2)
CO2: 19 mmol/L — ABNORMAL LOW (ref 20–29)
Calcium: 9 mg/dL (ref 8.7–10.3)
Chloride: 109 mmol/L — ABNORMAL HIGH (ref 96–106)
Creatinine, Ser: 0.8 mg/dL (ref 0.57–1.00)
GFR calc Af Amer: 88 mL/min/{1.73_m2} (ref >59)
GFR calc non Af Amer: 77 mL/min/{1.73_m2} (ref >59)
Globulin, Total: 2.1 g/dL (ref 1.5–4.5)
Glucose: 93 mg/dL (ref 65–99)
Potassium: 4.1 mmol/L (ref 3.5–5.2)
Sodium: 142 mmol/L (ref 134–144)
Total Protein: 6.5 g/dL (ref 6.0–8.5)

## 2019-08-23 LAB — TSH: TSH: 2.51 u[IU]/mL (ref 0.450–4.500)

## 2019-08-23 MED ORDER — LOVASTATIN 20 MG PO TABS: 40.0000 mg | ORAL_TABLET | Freq: Every day | ORAL | 0 refills | Status: DC

## 2019-08-23 NOTE — Telephone Encounter (Signed)
-----   Message from Margo Common, [place] sent at 08/23/2019 10:54 AM EDT ----- All blood tests essentially normal except LDL cholesterol above goal of <100. Should increase Lovastatin to 40 mg qd and recheck levels in 3 months.

## 2019-08-23 NOTE — Telephone Encounter (Signed)
Patient advised as directed below. Patient reports she does not have enough of the Lovastatin to take 40mg  will send RX to her pharmacy.

## 2019-09-06 ENCOUNTER — Other Ambulatory Visit: Payer: Self-pay | Admitting: Family Medicine

## 2019-09-06 DIAGNOSIS — Z1231 Encounter for screening mammogram for malignant neoplasm of breast: Secondary | ICD-10-CM

## 2019-10-05 ENCOUNTER — Other Ambulatory Visit: Payer: Self-pay | Admitting: Family Medicine

## 2019-11-04 DIAGNOSIS — Z0120 Encounter for dental examination and cleaning without abnormal findings: Secondary | ICD-10-CM | POA: Diagnosis not present

## 2020-01-21 ENCOUNTER — Other Ambulatory Visit: Payer: Self-pay | Admitting: Family Medicine

## 2020-01-21 ENCOUNTER — Ambulatory Visit
Admission: RE | Admit: 2020-01-21 | Discharge: 2020-01-21 | Disposition: A | Discharge status: Home / Self Care | Payer: Medicare Other | Source: Ambulatory Visit | Attending: Family Medicine | Admitting: Family Medicine

## 2020-01-21 DIAGNOSIS — R921 Mammographic calcification found on diagnostic imaging of breast: Secondary | ICD-10-CM

## 2020-01-21 DIAGNOSIS — R928 Other abnormal and inconclusive findings on diagnostic imaging of breast: Secondary | ICD-10-CM

## 2020-01-21 DIAGNOSIS — Z1231 Encounter for screening mammogram for malignant neoplasm of breast: Secondary | ICD-10-CM | POA: Diagnosis not present

## 2020-01-27 ENCOUNTER — Telehealth: Payer: Self-pay

## 2020-01-27 NOTE — Telephone Encounter (Signed)
Copied from [place] [id number]. Topic: Quick Sport and exercise psychologist Patient (Clinic Use ONLY) >> Jan 22, 2020  3:34 PM Ward, Dirk Dress, CMA wrote: Reason for CRM: unable to LVM for patient. Patient needs to contact insurance to change Dr. Rosanna Randy as primary provider. >> Jan 27, 2020  9:27 AM Alanda Slim E wrote: Pt called about being told by [facility] that she needs to call her insurance and switch her PCP to Dr. Rosanna Randy Pt is not sure why she needs to do this and would like to speak with someone at the office/ Pt will call [facility] to see if they know why they are advising this

## 2020-01-28 ENCOUNTER — Ambulatory Visit
Admission: RE | Admit: 2020-01-28 | Discharge: 2020-01-28 | Disposition: A | Discharge status: Home / Self Care | Payer: Medicare Other | Source: Ambulatory Visit | Attending: Family Medicine | Admitting: Family Medicine

## 2020-01-28 ENCOUNTER — Other Ambulatory Visit: Payer: Self-pay | Admitting: Family Medicine

## 2020-01-28 DIAGNOSIS — R921 Mammographic calcification found on diagnostic imaging of breast: Secondary | ICD-10-CM | POA: Diagnosis not present

## 2020-01-28 DIAGNOSIS — R928 Other abnormal and inconclusive findings on diagnostic imaging of breast: Secondary | ICD-10-CM | POA: Diagnosis not present

## 2020-01-28 DIAGNOSIS — R920 Mammographic microcalcification found on diagnostic imaging of breast: Secondary | ICD-10-CM | POA: Diagnosis not present

## 2020-01-29 NOTE — Telephone Encounter (Signed)
The patient was contacted about her insurance company calling her about changing her PCP to the provider that she was seen by and not the supervising physician. She had additional questions that needed to be answered by billing. The billing number [phone] was given to the patient to contact. She gave verbal understanding.

## 2020-02-12 ENCOUNTER — Ambulatory Visit
Admission: RE | Admit: 2020-02-12 | Discharge: 2020-02-12 | Disposition: A | Discharge status: Home / Self Care | Payer: Medicare Other | Source: Ambulatory Visit | Attending: Family Medicine | Admitting: Family Medicine

## 2020-02-12 DIAGNOSIS — R928 Other abnormal and inconclusive findings on diagnostic imaging of breast: Secondary | ICD-10-CM

## 2020-02-12 DIAGNOSIS — R921 Mammographic calcification found on diagnostic imaging of breast: Secondary | ICD-10-CM | POA: Diagnosis not present

## 2020-02-12 HISTORY — PX: BREAST BIOPSY: SHX20

## 2020-02-14 LAB — SURGICAL PATHOLOGY

## 2020-02-17 ENCOUNTER — Telehealth: Payer: Self-pay

## 2020-02-17 NOTE — Telephone Encounter (Signed)
Monica Delacruz has an appt with Dr. Peyton Najjar on 02/20/2020 at 10:45.

## 2020-02-18 ENCOUNTER — Telehealth: Payer: Self-pay

## 2020-02-18 NOTE — Telephone Encounter (Signed)
LMTCB, [facility] Triage Nurse may give patient results  

## 2020-02-18 NOTE — Telephone Encounter (Signed)
-----   Message from Margo Common, [place] sent at 02/17/2020  5:15 PM EDT ----- Radiologist recommended surgical evaluation with mucocele-like lesion in the upper outer quadrant of the left breast. Was this scheduled by [facility]?

## 2020-02-19 NOTE — Telephone Encounter (Signed)
Pt. Reports she has an appointment with a surgeon, Dr. Peyton Najjar, tomorrow morning. Pt. States [facility] scheduled this appointment.

## 2020-02-20 ENCOUNTER — Other Ambulatory Visit: Payer: Self-pay | Admitting: General Surgery

## 2020-02-20 DIAGNOSIS — R921 Mammographic calcification found on diagnostic imaging of breast: Secondary | ICD-10-CM | POA: Diagnosis not present

## 2020-02-21 ENCOUNTER — Ambulatory Visit: Payer: Self-pay | Admitting: General Surgery

## 2020-02-21 NOTE — H&P (Signed)
PATIENT PROFILE: Monica Delacruz is a 69 y.o. female who presents to the Clinic for consultation at the request of Dr. Jeananne Rama for evaluation of left breast calcifications.  PCP:  Monica Maple, MD  HISTORY OF PRESENT ILLNESS: Monica Delacruz reports having a screening mammogram.  The screening mammogram and a phone consultation on the left breast.  Diagnostic mammogram was done confirming full millimeters of calcifications.  Stereotactic core biopsy was done showing mostly a cystic calcifications unable to rule out pregnancy.  Patient denies any skin changes.  She has intermittent palpable knots in the left breast due to history of fibrocystic disease.  There is no pain radiation.  There is no alleviating or aggravating factor.  Family history of breast cancer: None Family history of other cancers: Father with stomach cancer.  Grandmother had cancer (unknown origin) Menarche: 69 years old Menopause: 69 years old Used OCP: Unknown Used estrogen and progesterone therapy: Estrogen (for kidney stones?) History of Radiation to the chest: None Number pregnancies: 4 Age of first pregnancy: 19 Previous breast biopsy: None  PROBLEM LIST:     Problem List  Date Reviewed: 11/26/2017   None      GENERAL REVIEW OF SYSTEMS:   General ROS: negative for - chills, fatigue, fever, weight gain or weight loss Allergy and Immunology ROS: negative for - hives  Hematological and Lymphatic ROS: negative for - bleeding problems or bruising, negative for palpable nodes Endocrine ROS: negative for - heat or cold intolerance, hair changes Respiratory ROS: negative for - cough, shortness of breath or wheezing Cardiovascular ROS: no chest pain or palpitations GI ROS: negative for nausea, vomiting, abdominal pain, diarrhea, constipation Musculoskeletal ROS: negative for - joint swelling or muscle pain Neurological ROS: negative for - confusion, syncope Dermatological ROS: negative for pruritus and  rash Psychiatric: negative for anxiety, depression, difficulty sleeping and memory loss  MEDICATIONS: Current Medications        Current Outpatient Medications  Medication Sig Dispense Refill  . amoxicillin (AMOXIL) 500 MG capsule 4 tablets prior to dental procedures    . butalbital-acetaminop-caf-cod 50-300-40-30 mg Cap   1  . cal-D3-mag11-zinc-cop-mang-bor 600 mg calcium- 800 unit-50 mg Tab Take by mouth    . cholecalciferol (VITAMIN D3) 1000 unit capsule Take 3,000 Units by mouth once daily    . fluticasone (FLONASE) 50 mcg/actuation nasal spray   12  . ibuprofen (ADVIL,MOTRIN) 200 MG tablet Take 800 mg by mouth 3 (three) times daily as needed for Pain.    Marland Kitchen krill/om-3/dha/epa/phospho/ast (MEGARED OMEGA-3 KRILL OIL ORAL) Take by mouth    . lovastatin (MEVACOR) 20 MG tablet   6   No current facility-administered medications for this visit.      ALLERGIES: Aspirin, Sulfa (sulfonamide antibiotics), and Ultram [tramadol]  PAST MEDICAL HISTORY:     Past Medical History:  Diagnosis Date  . Allergic state   . Borderline high cholesterol   . Hemorrhoids   . Kidney stones   . Migraines   . Osteoporosis, post-menopausal     PAST SURGICAL HISTORY:      Past Surgical History:  Procedure Laterality Date  . BILATERAL FOOT SURGERY    . BILATERAL LITHOTRIPSY    . FUNCTIONAL ENDOSCOPIC SINUS SURGERY    . HYSTERECTOMY     partial  . KDINEY STONE REMOVAL    . LEFT KNEE SURGERY    . LEFT TOTAL KNEE ARTHROPLASTY  08/26/2008     FAMILY HISTORY:      Family History  Problem Relation Age of Onset  . Stroke Mother   . High blood pressure (Hypertension) Mother   . Diabetes Mother   . Stomach cancer Father   . Ulcerative colitis Sister   . Parkinsonism Brother      SOCIAL HISTORY: Social History          Socioeconomic History  . Marital status: Married    Spouse name: Not on file  . Number of children: Not on file  .  Years of education: Not on file  . Highest education level: Not on file  Occupational History  . Not on file  Tobacco Use  . Smoking status: Never Smoker  . Smokeless tobacco: Never Used  Vaping Use  . Vaping Use: Never used  Substance and Sexual Activity  . Alcohol use: Yes    Comment: Socially  . Drug use: Defer  . Sexual activity: Defer  Other Topics Concern  . Not on file  Social History Narrative  . Not on file   Social Determinants of Health      Financial Resource Strain:   . Difficulty of Paying Living Expenses:   Food Insecurity:   . Worried About Charity fundraiser in the Last Year:   . Arboriculturist in the Last Year:   Transportation Needs:   . Film/video editor (Medical):   Marland Kitchen Lack of Transportation (Non-Medical):       PHYSICAL EXAM:    Vitals:   02/20/20 1050  BP: 133/87  Pulse: 83   Body mass index is 27.98 kg/m. Weight: 73.9 kg (163 lb)   GENERAL: Alert, active, oriented x3  HEENT: Pupils equal reactive to light. Extraocular movements are intact. Sclera clear. Palpebral conjunctiva normal red color.Pharynx clear.  NECK: Supple with no palpable mass and no adenopathy.  LUNGS: Sound clear with no rales rhonchi or wheezes.  HEART: Regular rhythm S1 and S2 without murmur.  BREAST: breasts appear normal, no suspicious masses, no skin or nipple changes or axillary nodes.  ABDOMEN: Soft and depressible, nontender with no palpable mass, no hepatomegaly.  EXTREMITIES: Well-developed well-nourished symmetrical with no dependent edema.  NEUROLOGICAL: Awake alert oriented, facial expression symmetrical, moving all extremities.  REVIEW OF DATA: I have reviewed the following data today: No visits with results within 3 Month(s) from this visit.  Latest known visit with results is:  Appointment on 11/09/2017  Component Date Value  . LV Ejection Fraction (%) 11/09/2017 55   . Aortic Valve Stenosis Gr* 11/09/2017 none   .  Aortic Valve Regurgitati* 11/09/2017 none   . Aortic Valve Max Velocit* 11/09/2017 1.7   . Mitral Valve Stenosis Gr* 11/09/2017 none   . Mitral Valve Regurgitati* 11/09/2017 trivial   . Tricuspid Valve Regurgit* 11/09/2017 trivial   . LV End Diastolic Diamete* 123456 3.8   . LV End Systolic Diameter* 123456 2.7   . LV Septum Wall Thickness* 11/09/2017 1.2   . LV Posterior Wall Thickn* 11/09/2017 1   . Left Atrium Diameter (cm) 11/09/2017 3.5      ASSESSMENT: Ms. Buchter is a 69 y.o. female presenting for consultation for left breast calcification unable to rule out malignancy on core biopsy.    Patient was oriented again about the pathology results.  I personally evaluated the pathology report.  Patient with mucinous cyst with ossifications that were not unable to rule out malignancy on core biopsy.  Due to the concern of possible malignancy excisional biopsy was recommended.  Risk of surgery was  discussed with patient including but not limited to: wound infection, seroma, hematoma, brachial plexopathy, mondor's disease (thrombosis of small veins of breast), chronic wound pain, breast lymphedema, altered sensation to the nipple and cosmesis among others.   Breast calcification, left [R92.1]  PLAN: 1.  Left breast radiofrequency tag guided excisional biopsy (19125) 2.  Avoid taking aspirin 5 days before the surgery 3.  Contact us if you have any concern  Patient verbalized understanding, all questions were answered, and were agreeable with the plan outlined above.     Herbert Pun, MD  Electronically signed by Herbert Pun, MD

## 2020-02-21 NOTE — H&P (View-Only) (Signed)
PATIENT PROFILE: Monica Delacruz is a 69 y.o. female who presents to the Clinic for consultation at the request of Dr. Jeananne Rama for evaluation of left breast calcifications.  PCP:  Guadalupe Maple, MD  HISTORY OF PRESENT ILLNESS: Ms. Reckers reports having a screening mammogram.  The screening mammogram and a phone consultation on the left breast.  Diagnostic mammogram was done confirming full millimeters of calcifications.  Stereotactic core biopsy was done showing mostly a cystic calcifications unable to rule out pregnancy.  Patient denies any skin changes.  She has intermittent palpable knots in the left breast due to history of fibrocystic disease.  There is no pain radiation.  There is no alleviating or aggravating factor.  Family history of breast cancer: None Family history of other cancers: Father with stomach cancer.  Grandmother had cancer (unknown origin) Menarche: 69 years old Menopause: 69 years old Used OCP: Unknown Used estrogen and progesterone therapy: Estrogen (for kidney stones?) History of Radiation to the chest: None Number pregnancies: 4 Age of first pregnancy: 19 Previous breast biopsy: None  PROBLEM LIST:     Problem List  Date Reviewed: 11/26/2017   None      GENERAL REVIEW OF SYSTEMS:   General ROS: negative for - chills, fatigue, fever, weight gain or weight loss Allergy and Immunology ROS: negative for - hives  Hematological and Lymphatic ROS: negative for - bleeding problems or bruising, negative for palpable nodes Endocrine ROS: negative for - heat or cold intolerance, hair changes Respiratory ROS: negative for - cough, shortness of breath or wheezing Cardiovascular ROS: no chest pain or palpitations GI ROS: negative for nausea, vomiting, abdominal pain, diarrhea, constipation Musculoskeletal ROS: negative for - joint swelling or muscle pain Neurological ROS: negative for - confusion, syncope Dermatological ROS: negative for pruritus and  rash Psychiatric: negative for anxiety, depression, difficulty sleeping and memory loss  MEDICATIONS: Current Medications        Current Outpatient Medications  Medication Sig Dispense Refill  . amoxicillin (AMOXIL) 500 MG capsule 4 tablets prior to dental procedures    . butalbital-acetaminop-caf-cod 50-300-40-30 mg Cap   1  . cal-D3-mag11-zinc-cop-mang-bor 600 mg calcium- 800 unit-50 mg Tab Take by mouth    . cholecalciferol (VITAMIN D3) 1000 unit capsule Take 3,000 Units by mouth once daily    . fluticasone (FLONASE) 50 mcg/actuation nasal spray   12  . ibuprofen (ADVIL,MOTRIN) 200 MG tablet Take 800 mg by mouth 3 (three) times daily as needed for Pain.    Marland Kitchen krill/om-3/dha/epa/phospho/ast (MEGARED OMEGA-3 KRILL OIL ORAL) Take by mouth    . lovastatin (MEVACOR) 20 MG tablet   6   No current facility-administered medications for this visit.      ALLERGIES: Aspirin, Sulfa (sulfonamide antibiotics), and Ultram [tramadol]  PAST MEDICAL HISTORY:     Past Medical History:  Diagnosis Date  . Allergic state   . Borderline high cholesterol   . Hemorrhoids   . Kidney stones   . Migraines   . Osteoporosis, post-menopausal     PAST SURGICAL HISTORY:      Past Surgical History:  Procedure Laterality Date  . BILATERAL FOOT SURGERY    . BILATERAL LITHOTRIPSY    . FUNCTIONAL ENDOSCOPIC SINUS SURGERY    . HYSTERECTOMY     partial  . KDINEY STONE REMOVAL    . LEFT KNEE SURGERY    . LEFT TOTAL KNEE ARTHROPLASTY  08/26/2008     FAMILY HISTORY:      Family History  Problem Relation Age of Onset  . Stroke Mother   . High blood pressure (Hypertension) Mother   . Diabetes Mother   . Stomach cancer Father   . Ulcerative colitis Sister   . Parkinsonism Brother      SOCIAL HISTORY: Social History          Socioeconomic History  . Marital status: Married    Spouse name: Not on file  . Number of children: Not on file  .  Years of education: Not on file  . Highest education level: Not on file  Occupational History  . Not on file  Tobacco Use  . Smoking status: Never Smoker  . Smokeless tobacco: Never Used  Vaping Use  . Vaping Use: Never used  Substance and Sexual Activity  . Alcohol use: Yes    Comment: Socially  . Drug use: Defer  . Sexual activity: Defer  Other Topics Concern  . Not on file  Social History Narrative  . Not on file   Social Determinants of Health      Financial Resource Strain:   . Difficulty of Paying Living Expenses:   Food Insecurity:   . Worried About Charity fundraiser in the Last Year:   . Arboriculturist in the Last Year:   Transportation Needs:   . Film/video editor (Medical):   Marland Kitchen Lack of Transportation (Non-Medical):       PHYSICAL EXAM:    Vitals:   02/20/20 1050  BP: 133/87  Pulse: 83   Body mass index is 27.98 kg/m. Weight: 73.9 kg (163 lb)   GENERAL: Alert, active, oriented x3  HEENT: Pupils equal reactive to light. Extraocular movements are intact. Sclera clear. Palpebral conjunctiva normal red color.Pharynx clear.  NECK: Supple with no palpable mass and no adenopathy.  LUNGS: Sound clear with no rales rhonchi or wheezes.  HEART: Regular rhythm S1 and S2 without murmur.  BREAST: breasts appear normal, no suspicious masses, no skin or nipple changes or axillary nodes.  ABDOMEN: Soft and depressible, nontender with no palpable mass, no hepatomegaly.  EXTREMITIES: Well-developed well-nourished symmetrical with no dependent edema.  NEUROLOGICAL: Awake alert oriented, facial expression symmetrical, moving all extremities.  REVIEW OF DATA: I have reviewed the following data today: No visits with results within 3 Month(s) from this visit.  Latest known visit with results is:  Appointment on 11/09/2017  Component Date Value  . LV Ejection Fraction (%) 11/09/2017 55   . Aortic Valve Stenosis Gr* 11/09/2017 none   .  Aortic Valve Regurgitati* 11/09/2017 none   . Aortic Valve Max Velocit* 11/09/2017 1.7   . Mitral Valve Stenosis Gr* 11/09/2017 none   . Mitral Valve Regurgitati* 11/09/2017 trivial   . Tricuspid Valve Regurgit* 11/09/2017 trivial   . LV End Diastolic Diamete* 123456 3.8   . LV End Systolic Diameter* 123456 2.7   . LV Septum Wall Thickness* 11/09/2017 1.2   . LV Posterior Wall Thickn* 11/09/2017 1   . Left Atrium Diameter (cm) 11/09/2017 3.5      ASSESSMENT: Ms. Derderian is a 69 y.o. female presenting for consultation for left breast calcification unable to rule out malignancy on core biopsy.    Patient was oriented again about the pathology results.  I personally evaluated the pathology report.  Patient with mucinous cyst with ossifications that were not unable to rule out malignancy on core biopsy.  Due to the concern of possible malignancy excisional biopsy was recommended.  Risk of surgery was  discussed with patient including but not limited to: wound infection, seroma, hematoma, brachial plexopathy, mondor's disease (thrombosis of small veins of breast), chronic wound pain, breast lymphedema, altered sensation to the nipple and cosmesis among others.   Breast calcification, left [R92.1]  PLAN: 1.  Left breast radiofrequency tag guided excisional biopsy (19125) 2.  Avoid taking aspirin 5 days before the surgery 3.  Contact us if you have any concern  Patient verbalized understanding, all questions were answered, and were agreeable with the plan outlined above.     Herbert Pun, MD  Electronically signed by Herbert Pun, MD

## 2020-02-25 ENCOUNTER — Other Ambulatory Visit: Payer: Self-pay

## 2020-02-25 ENCOUNTER — Encounter
Admission: RE | Admit: 2020-02-25 | Discharge: 2020-02-25 | Disposition: A | Discharge status: Home / Self Care | Payer: Medicare Other | Source: Ambulatory Visit | Attending: General Surgery | Admitting: General Surgery

## 2020-02-25 DIAGNOSIS — Z01818 Encounter for other preprocedural examination: Secondary | ICD-10-CM | POA: Diagnosis not present

## 2020-02-25 NOTE — Patient Instructions (Signed)
Your procedure is scheduled on: Wednesday 03/04/20.  Report to DAY SURGERY DEPARTMENT LOCATED ON 2ND FLOOR MEDICAL MALL ENTRANCE. To find out your arrival time please call [phone] between 1PM - 3PM on Tuesday 03/03/20.  Remember: Instructions that are not followed completely may result in serious medical risk, up to and including death, or upon the discretion of your surgeon and anesthesiologist your surgery may need to be rescheduled.      _X__ 1. Do not eat food after midnight the night before your procedure.                 No gum chewing or hard candies. You may drink clear liquids up to 2 hours                 before you are scheduled to arrive for your surgery- DO NOT drink clear                 liquids within 2 hours of the start of your surgery.                 Clear Liquids include:  water, apple juice without pulp, clear carbohydrate                 drink such as Clearfast or Gatorade, Black Coffee or Tea (Do not add                 anything to coffee or tea).   __X__2.  On the morning of surgery brush your teeth with toothpaste and water, you may rinse your mouth with mouthwash if you wish.  Do not swallow any toothpaste or mouthwash.      _X__ 3.  No Alcohol for 24 hours before or after surgery.   __X__4.  Notify your doctor if there is any change in your medical condition      (cold, fever, infections).      Do not wear jewelry, make-up, hairpins, clips or nail polish. Do not wear lotions, powders, or perfumes.  Do not shave 48 hours prior to surgery. Men may shave face and neck. Do not bring valuables to the hospital.     [facility] is not responsible for any belongings or valuables.   Contacts, dentures/partials or body piercings may not be worn into surgery. Bring a case for your contacts, glasses or hearing aids, a denture cup will be supplied.    Patients discharged the day of surgery will not be allowed to drive home.   __X__ Take these medicines the  morning of surgery with A SIP OF WATER:     1. cetirizine (ZYRTEC)   2. fluticasone (FLONASE) if needed  3. butalbital-acetaminophen-caffeine (FIORICET) if needed     __X__ Stop Anti-inflammatories 7 days before surgery such as Advil, Ibuprofen, Motrin, BC or Goodies Powder, Naprosyn, Naproxen, Aleve, Aspirin, Meloxicam. May take Tylenol if needed for pain or discomfort.   __X__ Stop MEGARED OMEGA-3 KRILL OIL PO today.     __X__ Don't start any new herbal supplements or vitamins prior to your procedure

## 2020-02-26 ENCOUNTER — Ambulatory Visit
Admission: RE | Admit: 2020-02-26 | Discharge: 2020-02-26 | Disposition: A | Discharge status: Home / Self Care | Payer: Medicare Other | Source: Ambulatory Visit | Attending: General Surgery | Admitting: General Surgery

## 2020-02-26 ENCOUNTER — Other Ambulatory Visit: Payer: Self-pay | Admitting: Family Medicine

## 2020-02-26 DIAGNOSIS — R921 Mammographic calcification found on diagnostic imaging of breast: Secondary | ICD-10-CM | POA: Diagnosis not present

## 2020-02-26 DIAGNOSIS — R928 Other abnormal and inconclusive findings on diagnostic imaging of breast: Secondary | ICD-10-CM | POA: Diagnosis not present

## 2020-02-26 NOTE — Telephone Encounter (Signed)
Requested medication (s) are due for refill today: yes  Requested medication (s) are on the active medication list: yes  Last refill:  01/29/2020  Future visit scheduled: no  Notes to clinic:  Patient report taking different than directions  Please verify    Requested Prescriptions  Pending Prescriptions Disp Refills   lovastatin (MEVACOR) 20 MG tablet [Pharmacy Med Name: LOVASTATIN 20MG  TABLETS] 180 tablet     Sig: TAKE 2 TABLETS(40 MG) BY MOUTH AT BEDTIME      Cardiovascular:  Antilipid - Statins Failed - 02/26/2020  8:25 AM      Failed - Total Cholesterol in normal range and within 360 days    Cholesterol, Total  Date Value Ref Range Status  08/22/2019 225 (H) 100 - 199 mg/dL Final          Failed - LDL in normal range and within 360 days    LDL Cholesterol (Calc)  Date Value Ref Range Status  10/20/2017 108 (H) mg/dL (calc) Final    Comment:    Reference range: <100 . Desirable range <100 mg/dL for primary prevention;   <70 mg/dL for patients with CHD or diabetic patients  with > or = 2 CHD risk factors. Marland Kitchen LDL-C is now calculated using the Martin-Hopkins  calculation, which is a validated novel method providing  better accuracy than the Friedewald equation in the  estimation of LDL-C.  Cresenciano Genre et al. Annamaria Helling. [id number]): 2061-2068  (http://education.[employer].com/faq/FAQ164)    LDL Chol Calc (NIH)  Date Value Ref Range Status  08/22/2019 148 (H) 0 - 99 mg/dL Final          Failed - Valid encounter within last 12 months    Recent Outpatient Visits           6 months ago Annual physical exam   [place], Vickki Muff, [place]   2 years ago [place], Clearnce Sorrel, [place]   2 years ago Annual physical exam   [facility], [place], [place]   3 years ago BMI 28.0-28.9,adult   [facility], [place], [place]   3 years ago Annual physical exam   [place], [place]              Passed - HDL in normal range and within 360 days    HDL  Date Value Ref Range Status  08/22/2019 58 >39 mg/dL Final          Passed - Triglycerides in normal range and within 360 days    Triglycerides  Date Value Ref Range Status  08/22/2019 109 0 - 149 mg/dL Final          Passed - Patient is not pregnant

## 2020-02-28 ENCOUNTER — Other Ambulatory Visit: Payer: Self-pay | Admitting: Physician Assistant

## 2020-02-28 ENCOUNTER — Encounter: Payer: Self-pay | Admitting: Physician Assistant

## 2020-02-28 ENCOUNTER — Ambulatory Visit: Payer: Self-pay

## 2020-02-28 ENCOUNTER — Ambulatory Visit (INDEPENDENT_AMBULATORY_CARE_PROVIDER_SITE_OTHER): Payer: Medicare Other | Admitting: Physician Assistant

## 2020-02-28 ENCOUNTER — Other Ambulatory Visit: Payer: Self-pay

## 2020-02-28 VITALS — BP 140/77 | HR 86 | Temp 97.7°F | Resp 16 | Wt 163.0 lb

## 2020-02-28 DIAGNOSIS — N200 Calculus of kidney: Secondary | ICD-10-CM

## 2020-02-28 DIAGNOSIS — R110 Nausea: Secondary | ICD-10-CM

## 2020-02-28 DIAGNOSIS — R1031 Right lower quadrant pain: Secondary | ICD-10-CM

## 2020-02-28 DIAGNOSIS — R109 Unspecified abdominal pain: Secondary | ICD-10-CM

## 2020-02-28 DIAGNOSIS — R10A0 Flank pain, unspecified side: Secondary | ICD-10-CM

## 2020-02-28 DIAGNOSIS — R3989 Other symptoms and signs involving the genitourinary system: Secondary | ICD-10-CM | POA: Diagnosis not present

## 2020-02-28 LAB — POCT URINALYSIS DIPSTICK
Bilirubin, UA: NEGATIVE
Glucose, UA: NEGATIVE
Ketones, UA: NEGATIVE
Nitrite, UA: POSITIVE
Protein, UA: POSITIVE — AB
Spec Grav, UA: 1.02 (ref 1.010–1.025)
Urobilinogen, UA: 0.2 E.U./dL
pH, UA: 6 (ref 5.0–8.0)

## 2020-02-28 MED ORDER — DUTASTERIDE 0.5 MG PO CAPS: 0.5000 mg | ORAL_CAPSULE | Freq: Every day | ORAL | 0 refills | Status: DC

## 2020-02-28 MED ORDER — ONDANSETRON HCL 4 MG PO TABS: 4.0000 mg | ORAL_TABLET | Freq: Three times a day (TID) | ORAL | 0 refills | Status: DC | PRN

## 2020-02-28 MED ORDER — HYDROCODONE-ACETAMINOPHEN 5-325 MG PO TABS: 1.0000 | ORAL_TABLET | Freq: Four times a day (QID) | ORAL | 0 refills | Status: DC | PRN

## 2020-02-28 MED ORDER — AMOXICILLIN-POT CLAVULANATE 875-125 MG PO TABS: 1.0000 | ORAL_TABLET | Freq: Two times a day (BID) | ORAL | 0 refills | Status: DC

## 2020-02-28 NOTE — Telephone Encounter (Signed)
Pt. called with c/o onset of "pink tinge to urine" and a "pinching sensation" when she finishes urinating.  Also c/o urinary frequency, and discomfort in low back and right flank.  Rated discomfort at 6-7/10.  Denied fever/ chills.  Stated she was nauseated 2 days ago.  Reported hx of kidney stones, and UTI's.  Appt. Scheduled today at PCP office at 6:00 PM.  Pt. Agrees with plan.   Reason for Disposition . Side (flank) or lower back pain present  Answer Assessment - Initial Assessment Questions 1. SYMPTOM: "What's the main symptom you're concerned about?" (e.g., frequency, incontinence)     Pink tinge to toilet paper with wiping after urination; pinching feeling  2. ONSET: "When did the  symptoms start?"     today 3. PAIN: "Is there any pain?" If so, ask: "How bad is it?" (Scale: 1-10; mild, moderate, severe)     Pain in lower back; 6-7/10  4. CAUSE: "What do you think is causing the symptoms?"     Has had UTI and hx of kidney stones  5. OTHER SYMPTOMS: "Do you have any other symptoms?" (e.g., fever, flank pain, blood in urine, pain with urination)    Right flank pain with pushing with her fist; nausea about 2 days ago; denied fever.  C/o freq. Of urination; c/o pinching type discomfort with urination  6. PREGNANCY: "Is there any chance you are pregnant?" "When was your last menstrual period?"     N/a  Protocols used: URINARY [facility]

## 2020-02-28 NOTE — Progress Notes (Signed)
Established patient visit   Patient: Monica Delacruz   DOB: 06/16/1951   69 y.o. Female  MRN: [id number] Visit Date: 02/28/2020  Today's healthcare provider: Mar Daring, PA-C   Chief Complaint  Patient presents with  . Flank Pain   Subjective    Flank Pain This is a new (Right side) problem. The current episode started today. The problem occurs constantly. The problem has been gradually worsening since onset. The quality of the pain is described as aching and stabbing. The pain does not radiate. The pain is at a severity of 6/10. The pain is moderate. The symptoms are aggravated by sitting and standing. (Little clots of blood, urine cloudy, side pain of RLQ just started when she got here for the appointment. Reports it feels like a kidney stone) She has tried nothing for the symptoms. The treatment provided no relief.  She has seen urology for kidney stones.She was last seen for Nephrolithiasis 11/25/2016. She also had Ureteroscopy in 10/03/2016 Last time she ate was this morning and last time she drink was about an hour in half ago.  Patient Active Problem List   Diagnosis Date Noted  . Migraine with aura and without status migrainosus, not intractable 10/19/2017  . Right ureteral stone 08/29/2016  . Abortion, spontaneous 08/23/2016  . Compression fracture 08/23/2016  . Esophagitis, reflux 08/23/2016  . Hemorrhoid 08/23/2016  . Arthritis, degenerative 08/23/2016  . RAD (reactive airway disease) 08/23/2016  . Hypercholesterolemia without hypertriglyceridemia 08/23/2016  . Neuralgia neuritis, sciatic nerve 08/23/2016  . Avitaminosis D 08/23/2016  . Low back pain 01/11/2010  . Herniation of nucleus pulposus 10/02/2009  . Osteopenia 10/02/2009   Past Medical History:  Diagnosis Date  . Back pain    lumbar fracture  . Diverticulosis   . Headache    Migraine  . History of bronchitis    2014  . History of colon polyps   . History of kidney stones   . History of  migraine    last one about 2wks ago  . Hyperlipidemia    takes Lovasatin daily  . Seasonal allergies    takes Claritin daily and Flonase daily       Medications: Outpatient Medications Prior to Visit  Medication Sig  . butalbital-acetaminophen-caffeine (FIORICET) 50-325-40 MG tablet Take 1 tablet by mouth every 6 (six) hours as needed for migraine.  . cetirizine (ZYRTEC) 10 MG tablet Take 10 mg by mouth daily.  . fluticasone (FLONASE) 50 MCG/ACT nasal spray Place 1 spray into both nostrils daily as needed for allergies or rhinitis.  Marland Kitchen ibuprofen (ADVIL) 200 MG tablet Take 600 mg by mouth every 6 (six) hours as needed for moderate pain.  Marland Kitchen lovastatin (MEVACOR) 20 MG tablet TAKE 2 TABLETS(40 MG) BY MOUTH AT BEDTIME (Patient taking differently: Take 20 mg by mouth at bedtime. )  . MEGARED OMEGA-3 KRILL OIL PO Take 1 tablet by mouth daily.   . vitamin B-12 (CYANOCOBALAMIN) 100 MCG tablet Take 100 mcg by mouth daily.  Marland Kitchen VITAMIN D PO Take 3 tablets by mouth daily. 600 units per  . amoxicillin (AMOXIL) 500 MG tablet Take 2,000 mg by mouth See admin instructions. For dental procedures   No facility-administered medications prior to visit.    Review of Systems  Genitourinary: Positive for flank pain.    Last CBC Lab Results  Component Value Date   WBC 4.8 08/22/2019   HGB 13.9 08/22/2019   HCT 42.6 08/22/2019   MCV 91 08/22/2019  MCH 29.7 08/22/2019   RDW 13.2 08/22/2019   PLT 260 0000000   Last metabolic panel Lab Results  Component Value Date   GLUCOSE 93 08/22/2019   NA 142 08/22/2019   K 4.1 08/22/2019   CL 109 (H) 08/22/2019   CO2 19 (L) 08/22/2019   BUN 13 08/22/2019   CREATININE 0.80 08/22/2019   GFRNONAA 77 08/22/2019   GFRAA 88 08/22/2019   CALCIUM 9.0 08/22/2019   PROT 6.5 08/22/2019   ALBUMIN 4.4 08/22/2019   LABGLOB 2.1 08/22/2019   AGRATIO 2.1 08/22/2019   BILITOT 0.3 08/22/2019   ALKPHOS 92 08/22/2019   AST 23 08/22/2019   ALT 23 08/22/2019    ANIONGAP 5 08/31/2016      Objective    BP 140/77 (BP Location: Left Arm, Patient Position: Sitting, Cuff Size: Large)   Pulse 86   Temp 97.7 F (36.5 C) (Temporal)   Resp 16   Wt 163 lb (73.9 kg)   BMI 27.12 kg/m  BP Readings from Last 3 Encounters:  02/28/20 140/77  08/20/19 (!) 142/76  10/19/17 (!) 160/82   Wt Readings from Last 3 Encounters:  02/28/20 163 lb (73.9 kg)  02/25/20 163 lb (73.9 kg)  08/20/19 177 lb (80.3 kg)      Physical Exam Constitutional:      General: She is not in acute distress.    Appearance: Normal appearance. She is well-developed and normal weight. She is not ill-appearing or diaphoretic.  HENT:     Head: Normocephalic and atraumatic.  Cardiovascular:     Rate and Rhythm: Normal rate and regular rhythm.     Heart sounds: Normal heart sounds. No murmur. No friction rub. No gallop.   Pulmonary:     Effort: Pulmonary effort is normal. No respiratory distress.     Breath sounds: Normal breath sounds. No wheezing or rales.  Abdominal:     General: Abdomen is flat. Bowel sounds are normal. There is no distension.     Palpations: Abdomen is soft. There is no mass.     Tenderness: There is abdominal tenderness in the suprapubic area. There is right CVA tenderness. There is no guarding or rebound.     Comments: Suprapubic tenderness  Musculoskeletal:     Right lower leg: No edema.     Left lower leg: No edema.  Skin:    General: Skin is warm and dry.  Neurological:     General: No focal deficit present.     Mental Status: She is alert and oriented to person, place, and time. Mental status is at baseline.       Results for orders placed or performed in visit on 02/28/20  POCT urinalysis dipstick  Result Value Ref Range   Color, UA Yellow    Clarity, UA Cloudy    Glucose, UA Negative Negative   Bilirubin, UA Negative    Ketones, UA Negative    Spec Grav, UA 1.020 1.010 - 1.025   Blood, UA Large    pH, UA 6.0 5.0 - 8.0   Protein, UA  Positive (A) Negative   Urobilinogen, UA 0.2 0.2 or 1.0 E.U./dL   Nitrite, UA Positive    Leukocytes, UA Moderate (2+) (A) Negative   Appearance     Odor      Assessment & Plan     1. Flank pain UA positive. Will send for culture.  - POCT urinalysis dipstick - Urine Culture  2. Possible urinary tract infection Worsening symptoms. UA  positive. Will treat empirically with Augmentin as below. Pyridium given for spasm. Continue to push fluids. Urine sent for culture. Will follow up pending C&S results. She is to call if symptoms do not improve or if they worsen.  - POCT urinalysis dipstick - Urine Culture - amoxicillin-clavulanate (AUGMENTIN) 875-125 MG tablet; Take 1 tablet by mouth 2 (two) times daily.  Dispense: 20 tablet; Refill: 0  3. Kidney stone H/O kidney stone on right side requiring lithotripsy. Pain medication given as below. [place] reviewed. Stone study ordered. Will f/u pending results.  - HYDROcodone-acetaminophen (NORCO/VICODIN) 5-325 MG tablet; Take 1 tablet by mouth every 6 (six) hours as needed for moderate pain.  Dispense: 30 tablet; Refill: 0  4. Nausea Zofran for nausea prn. - ondansetron (ZOFRAN) 4 MG tablet; Take 1 tablet (4 mg total) by mouth every 8 (eight) hours as needed.  Dispense: 20 tablet; Refill: 0  5. Right lower quadrant abdominal pain See above medical treatment plan. - CT RENAL STONE STUDY; Future   Return if symptoms worsen or fail to improve.      Reynolds Bowl, PA-C, have reviewed all documentation for this visit. The documentation on 03/02/20 for the exam, diagnosis, procedures, and orders are all accurate and complete.   Rubye Beach  [facility] [phone] (phone) [phone] (fax)  [place]

## 2020-02-28 NOTE — Patient Instructions (Signed)
Kidney Stones  Kidney stones are solid, rock-like deposits that form inside of the kidneys. The kidneys are a pair of organs that make urine. A kidney stone may form in a kidney and move into other parts of the urinary tract, including the tubes that connect the kidneys to the bladder (ureters), the bladder, and the tube that carries urine out of the body (urethra). As the stone moves through these areas, it can cause intense pain and block the flow of urine. Kidney stones are created when high levels of certain minerals are found in the urine. The stones are usually passed out of the body through urination, but in some cases, medical treatment may be needed to remove them. What are the causes? Kidney stones may be caused by:  A condition in which certain glands produce too much parathyroid hormone (primary hyperparathyroidism), which causes too much calcium buildup in the blood.  A buildup of uric acid crystals in the bladder (hyperuricosuria). Uric acid is a chemical that the body produces when you eat certain foods. It usually exits the body in the urine.  Narrowing (stricture) of one or both of the ureters.  A kidney blockage that is present at birth (congenital obstruction).  Past surgery on the kidney or the ureters, such as gastric bypass surgery. What increases the risk? The following factors may make you more likely to develop this condition:  Having had a kidney stone in the past.  Having a family history of kidney stones.  Not drinking enough water.  Eating a diet that is high in protein, salt (sodium), or sugar.  Being overweight or obese. What are the signs or symptoms? Symptoms of a kidney stone may include:  Pain in the side of the abdomen, right below the ribs (flank pain). Pain usually spreads (radiates) to the groin.  Needing to urinate frequently or urgently.  Painful urination.  Blood in the urine (hematuria).  Nausea.  Vomiting.  Fever and chills. How  is this diagnosed? This condition may be diagnosed based on:  Your symptoms and medical history.  A physical exam.  Blood tests.  Urine tests. These may be done before and after the stone passes out of your body through urination.  Imaging tests, such as a CT scan, abdominal X-ray, or ultrasound.  A procedure to examine the inside of the bladder (cystoscopy). How is this treated? Treatment for kidney stones depends on the size, location, and makeup of the stones. Kidney stones will often pass out of the body through urination. You may need to:  Increase your fluid intake to help pass the stone. In some cases, you may be given fluids through an IV and may need to be monitored at the hospital.  Take medicine for pain.  Make changes in your diet to help prevent kidney stones from coming back. Sometimes, medical procedures are needed to remove a kidney stone. This may involve:  A procedure to break up kidney stones using: ? A focused beam of light (laser therapy). ? Shock waves (extracorporeal shock wave lithotripsy).  Surgery to remove kidney stones. This may be needed if you have severe pain or have stones that block your urinary tract. Follow these instructions at home: Medicines  Take over-the-counter and prescription medicines only as told by your health care provider.  Ask your health care provider if the medicine prescribed to you requires you to avoid driving or using heavy machinery. Eating and drinking  Drink enough fluid to keep your urine pale yellow.   You may be instructed to drink at least 8-10 glasses of water each day. This will help you pass the kidney stone.  If directed, change your diet. This may include: ? Limiting how much sodium you eat. ? Eating more fruits and vegetables. ? Limiting how much animal protein--such as red meat, poultry, fish, and eggs--you eat.  Follow instructions from your health care provider about eating or drinking  restrictions. General instructions  Collect urine samples as told by your health care provider. You may need to collect a urine sample: ? 24 hours after you pass the stone. ? 8-12 weeks after passing the kidney stone, and every 6-12 months after that.  Strain your urine every time you urinate, for as long as directed. Use the strainer that your health care provider recommends.  Do not throw out the kidney stone after passing it. Keep the stone so it can be tested by your health care provider. Testing the makeup of your kidney stone may help prevent you from getting kidney stones in the future.  Keep all follow-up visits as told by your health care provider. This is important. You may need follow-up X-rays or ultrasounds to make sure that your stone has passed. How is this prevented? To prevent another kidney stone:  Drink enough fluid to keep your urine pale yellow. This is the best way to prevent kidney stones.  Eat a healthy diet and follow recommendations from your health care provider about foods to avoid. You may be instructed to eat a low-protein diet. Recommendations vary depending on the type of kidney stone that you have.  Maintain a healthy weight. Where to find more information  National Kidney Foundation (NKF): www.kidney.org  [employer] (UCF): www.urologyhealth.org Contact a health care provider if:  You have pain that gets worse or does not get better with medicine. Get help right away if:  You have a fever or chills.  You develop severe pain.  You develop new abdominal pain.  You faint.  You are unable to urinate. Summary  Kidney stones are solid, rock-like deposits that form inside of the kidneys.  Kidney stones can cause nausea, vomiting, blood in the urine, abdominal pain, and the urge to urinate frequently.  Treatment for kidney stones depends on the size, location, and makeup of the stones. Kidney stones will often pass out of the body  through urination.  Kidney stones can be prevented by drinking enough fluids, eating a healthy diet, and maintaining a healthy weight. This information is not intended to replace advice given to you by your health care provider. Make sure you discuss any questions you have with your health care provider. Document Revised: 02/26/2019 Document Reviewed: 02/26/2019 Elsevier Patient Education  2020 [employer]vier Inc.  

## 2020-03-02 ENCOUNTER — Other Ambulatory Visit: Payer: Self-pay

## 2020-03-02 ENCOUNTER — Other Ambulatory Visit
Admission: RE | Admit: 2020-03-02 | Discharge: 2020-03-02 | Disposition: A | Discharge status: Home / Self Care | Payer: Medicare Other | Source: Ambulatory Visit | Attending: General Surgery | Admitting: General Surgery

## 2020-03-02 DIAGNOSIS — Z01812 Encounter for preprocedural laboratory examination: Secondary | ICD-10-CM | POA: Insufficient documentation

## 2020-03-02 DIAGNOSIS — Z20822 Contact with and (suspected) exposure to covid-19: Secondary | ICD-10-CM | POA: Insufficient documentation

## 2020-03-02 LAB — SARS CORONAVIRUS 2 (TAT 6-24 HRS): SARS Coronavirus 2: NEGATIVE

## 2020-03-04 ENCOUNTER — Ambulatory Visit: Payer: Medicare Other | Admitting: Certified Registered Nurse Anesthetist

## 2020-03-04 ENCOUNTER — Ambulatory Visit
Admission: RE | Admit: 2020-03-04 | Discharge: 2020-03-04 | Disposition: A | Discharge status: Home / Self Care | Payer: Medicare Other | Attending: General Surgery | Admitting: General Surgery

## 2020-03-04 ENCOUNTER — Ambulatory Visit
Admission: RE | Admit: 2020-03-04 | Discharge: 2020-03-04 | Disposition: A | Discharge status: Home / Self Care | Payer: Medicare Other | Source: Ambulatory Visit | Attending: General Surgery | Admitting: General Surgery

## 2020-03-04 ENCOUNTER — Other Ambulatory Visit: Payer: Self-pay

## 2020-03-04 ENCOUNTER — Encounter
Admission: RE | Disposition: A | Discharge status: Home / Self Care | Payer: Self-pay | Source: Home / Self Care | Attending: General Surgery

## 2020-03-04 ENCOUNTER — Encounter: Payer: Self-pay | Admitting: General Surgery

## 2020-03-04 DIAGNOSIS — Z79899 Other long term (current) drug therapy: Secondary | ICD-10-CM | POA: Diagnosis not present

## 2020-03-04 DIAGNOSIS — Z833 Family history of diabetes mellitus: Secondary | ICD-10-CM | POA: Insufficient documentation

## 2020-03-04 DIAGNOSIS — G43909 Migraine, unspecified, not intractable, without status migrainosus: Secondary | ICD-10-CM | POA: Insufficient documentation

## 2020-03-04 DIAGNOSIS — Z882 Allergy status to sulfonamides status: Secondary | ICD-10-CM | POA: Diagnosis not present

## 2020-03-04 DIAGNOSIS — R921 Mammographic calcification found on diagnostic imaging of breast: Secondary | ICD-10-CM | POA: Diagnosis not present

## 2020-03-04 DIAGNOSIS — E785 Hyperlipidemia, unspecified: Secondary | ICD-10-CM | POA: Diagnosis not present

## 2020-03-04 DIAGNOSIS — Z820 Family history of epilepsy and other diseases of the nervous system: Secondary | ICD-10-CM | POA: Diagnosis not present

## 2020-03-04 DIAGNOSIS — Z87442 Personal history of urinary calculi: Secondary | ICD-10-CM | POA: Insufficient documentation

## 2020-03-04 DIAGNOSIS — R928 Other abnormal and inconclusive findings on diagnostic imaging of breast: Secondary | ICD-10-CM | POA: Diagnosis not present

## 2020-03-04 DIAGNOSIS — N6042 Mammary duct ectasia of left breast: Secondary | ICD-10-CM | POA: Diagnosis not present

## 2020-03-04 DIAGNOSIS — M1990 Unspecified osteoarthritis, unspecified site: Secondary | ICD-10-CM | POA: Diagnosis not present

## 2020-03-04 DIAGNOSIS — Z90710 Acquired absence of both cervix and uterus: Secondary | ICD-10-CM | POA: Insufficient documentation

## 2020-03-04 DIAGNOSIS — M810 Age-related osteoporosis without current pathological fracture: Secondary | ICD-10-CM | POA: Diagnosis not present

## 2020-03-04 DIAGNOSIS — Z791 Long term (current) use of non-steroidal anti-inflammatories (NSAID): Secondary | ICD-10-CM | POA: Diagnosis not present

## 2020-03-04 DIAGNOSIS — Z885 Allergy status to narcotic agent status: Secondary | ICD-10-CM | POA: Insufficient documentation

## 2020-03-04 DIAGNOSIS — N6320 Unspecified lump in the left breast, unspecified quadrant: Secondary | ICD-10-CM | POA: Diagnosis present

## 2020-03-04 DIAGNOSIS — Z8249 Family history of ischemic heart disease and other diseases of the circulatory system: Secondary | ICD-10-CM | POA: Diagnosis not present

## 2020-03-04 DIAGNOSIS — Z8379 Family history of other diseases of the digestive system: Secondary | ICD-10-CM | POA: Insufficient documentation

## 2020-03-04 DIAGNOSIS — Z86010 Personal history of colonic polyps: Secondary | ICD-10-CM | POA: Diagnosis not present

## 2020-03-04 DIAGNOSIS — Z823 Family history of stroke: Secondary | ICD-10-CM | POA: Diagnosis not present

## 2020-03-04 DIAGNOSIS — Z886 Allergy status to analgesic agent status: Secondary | ICD-10-CM | POA: Insufficient documentation

## 2020-03-04 DIAGNOSIS — Z800 Family history of malignant neoplasm of digestive organs: Secondary | ICD-10-CM | POA: Diagnosis not present

## 2020-03-04 DIAGNOSIS — Z96652 Presence of left artificial knee joint: Secondary | ICD-10-CM | POA: Insufficient documentation

## 2020-03-04 DIAGNOSIS — E7800 Pure hypercholesterolemia, unspecified: Secondary | ICD-10-CM | POA: Diagnosis not present

## 2020-03-04 DIAGNOSIS — Z888 Allergy status to other drugs, medicaments and biological substances status: Secondary | ICD-10-CM | POA: Insufficient documentation

## 2020-03-04 DIAGNOSIS — R920 Mammographic microcalcification found on diagnostic imaging of breast: Secondary | ICD-10-CM | POA: Diagnosis not present

## 2020-03-04 HISTORY — PX: BREAST BIOPSY WITH RADIO FREQUENCY LOCALIZER: SHX6895

## 2020-03-04 SURGERY — BREAST BIOPSY WITH RADIO FREQUENCY LOCALIZER
Anesthesia: General | Site: Breast | Laterality: Left

## 2020-03-04 MED ORDER — ONDANSETRON HCL 4 MG/2ML IJ SOLN
INTRAMUSCULAR | Status: DC | PRN
  Administered 2020-03-04: 4 mg via INTRAVENOUS

## 2020-03-04 MED ORDER — EPHEDRINE SULFATE 50 MG/ML IJ SOLN
INTRAMUSCULAR | Status: DC | PRN
  Administered 2020-03-04 (×4): 10 mg via INTRAVENOUS

## 2020-03-04 MED ORDER — MIDAZOLAM HCL 2 MG/2ML IJ SOLN
INTRAMUSCULAR | Status: AC
  Filled 2020-03-04: qty 2

## 2020-03-04 MED ORDER — HYDROCODONE-ACETAMINOPHEN 5-325 MG PO TABS
ORAL_TABLET | ORAL | Status: AC
  Filled 2020-03-04: qty 1

## 2020-03-04 MED ORDER — LIDOCAINE HCL (PF) 2 % IJ SOLN
INTRAMUSCULAR | Status: AC
  Filled 2020-03-04: qty 5

## 2020-03-04 MED ORDER — ACETAMINOPHEN 10 MG/ML IV SOLN
INTRAVENOUS | Status: AC
  Filled 2020-03-04: qty 100

## 2020-03-04 MED ORDER — EPHEDRINE 5 MG/ML INJ
INTRAVENOUS | Status: AC
  Filled 2020-03-04: qty 10

## 2020-03-04 MED ORDER — HYDROCODONE-ACETAMINOPHEN 5-325 MG PO TABS
1.0000 | ORAL_TABLET | Freq: Once | ORAL | Status: AC
  Administered 2020-03-04: 1 via ORAL

## 2020-03-04 MED ORDER — MIDAZOLAM HCL 2 MG/2ML IJ SOLN
INTRAMUSCULAR | Status: DC | PRN
  Administered 2020-03-04: 2 mg via INTRAVENOUS

## 2020-03-04 MED ORDER — FENTANYL CITRATE (PF) 100 MCG/2ML IJ SOLN
INTRAMUSCULAR | Status: AC
  Filled 2020-03-04: qty 2

## 2020-03-04 MED ORDER — MEPERIDINE HCL 50 MG/ML IJ SOLN: 6.2500 mg | INTRAMUSCULAR | Status: DC | PRN

## 2020-03-04 MED ORDER — DEXAMETHASONE SODIUM PHOSPHATE 10 MG/ML IJ SOLN
INTRAMUSCULAR | Status: DC | PRN
  Administered 2020-03-04: 10 mg via INTRAVENOUS

## 2020-03-04 MED ORDER — FENTANYL CITRATE (PF) 100 MCG/2ML IJ SOLN
INTRAMUSCULAR | Status: DC | PRN
  Administered 2020-03-04: 25 ug via INTRAVENOUS
  Administered 2020-03-04: 50 ug via INTRAVENOUS
  Administered 2020-03-04: 25 ug via INTRAVENOUS

## 2020-03-04 MED ORDER — FENTANYL CITRATE (PF) 100 MCG/2ML IJ SOLN
25.0000 ug | INTRAMUSCULAR | Status: DC | PRN
  Administered 2020-03-04 (×2): 50 ug via INTRAVENOUS

## 2020-03-04 MED ORDER — EPINEPHRINE PF 1 MG/ML IJ SOLN
INTRAMUSCULAR | Status: AC
  Filled 2020-03-04: qty 1

## 2020-03-04 MED ORDER — BUPIVACAINE HCL (PF) 0.5 % IJ SOLN
INTRAMUSCULAR | Status: AC
  Filled 2020-03-04: qty 30

## 2020-03-04 MED ORDER — PROPOFOL 10 MG/ML IV BOLUS
INTRAVENOUS | Status: AC
  Filled 2020-03-04: qty 20

## 2020-03-04 MED ORDER — CEFAZOLIN SODIUM-DEXTROSE 2-4 GM/100ML-% IV SOLN
INTRAVENOUS | Status: AC
  Filled 2020-03-04: qty 100

## 2020-03-04 MED ORDER — PROMETHAZINE HCL 25 MG/ML IJ SOLN: 6.2500 mg | INTRAMUSCULAR | Status: DC | PRN

## 2020-03-04 MED ORDER — ACETAMINOPHEN 10 MG/ML IV SOLN
INTRAVENOUS | Status: DC | PRN
  Administered 2020-03-04: 1000 mg via INTRAVENOUS

## 2020-03-04 MED ORDER — BUPIVACAINE-EPINEPHRINE (PF) 0.5% -1:200000 IJ SOLN
INTRAMUSCULAR | Status: DC | PRN
  Administered 2020-03-04: 30 mL via PERINEURAL

## 2020-03-04 MED ORDER — FAMOTIDINE 20 MG PO TABS
ORAL_TABLET | ORAL | Status: AC
  Administered 2020-03-04: 20 mg via ORAL
  Filled 2020-03-04: qty 1

## 2020-03-04 MED ORDER — ONDANSETRON HCL 4 MG/2ML IJ SOLN
INTRAMUSCULAR | Status: AC
  Filled 2020-03-04: qty 2

## 2020-03-04 MED ORDER — CEFAZOLIN SODIUM-DEXTROSE 2-4 GM/100ML-% IV SOLN
2.0000 g | INTRAVENOUS | Status: AC
  Administered 2020-03-04: 2 g via INTRAVENOUS

## 2020-03-04 MED ORDER — LACTATED RINGERS IV SOLN: INTRAVENOUS | Status: DC

## 2020-03-04 MED ORDER — FAMOTIDINE 20 MG PO TABS: 20.0000 mg | ORAL_TABLET | Freq: Once | ORAL | Status: AC

## 2020-03-04 MED ORDER — PROPOFOL 10 MG/ML IV BOLUS
INTRAVENOUS | Status: DC | PRN
  Administered 2020-03-04: 150 mg via INTRAVENOUS

## 2020-03-04 MED ORDER — LIDOCAINE HCL (CARDIAC) PF 100 MG/5ML IV SOSY
PREFILLED_SYRINGE | INTRAVENOUS | Status: DC | PRN
  Administered 2020-03-04: 100 mg via INTRAVENOUS

## 2020-03-04 SURGICAL SUPPLY — 43 items
BLADE SURG 15 STRL LF DISP TIS (BLADE) ×1 IMPLANT
BLADE SURG 15 STRL SS (BLADE) ×1
CANISTER SUCT 1200ML W/VALVE (MISCELLANEOUS) ×2 IMPLANT
CHLORAPREP W/TINT 26 (MISCELLANEOUS) ×2 IMPLANT
CNTNR SPEC 2.5X3XGRAD LEK (MISCELLANEOUS) ×1
CONT SPEC 4OZ STER OR WHT (MISCELLANEOUS) ×1
CONTAINER SPEC 2.5X3XGRAD LEK (MISCELLANEOUS) ×1 IMPLANT
COVER WAND RF STERILE (DRAPES) ×2 IMPLANT
DERMABOND ADVANCED (GAUZE/BANDAGES/DRESSINGS) ×1
DERMABOND ADVANCED .7 DNX12 (GAUZE/BANDAGES/DRESSINGS) ×1 IMPLANT
DEVICE DUBIN SPECIMEN MAMMOGRA (MISCELLANEOUS) ×2 IMPLANT
DRAPE LAPAROTOMY TRNSV 106X77 (MISCELLANEOUS) ×2 IMPLANT
ELECT CAUTERY BLADE TIP 2.5 (TIP) ×2
ELECT REM PT RETURN 9FT ADLT (ELECTROSURGICAL) ×2
ELECTRODE CAUTERY BLDE TIP 2.5 (TIP) ×1 IMPLANT
ELECTRODE REM PT RTRN 9FT ADLT (ELECTROSURGICAL) ×1 IMPLANT
GLOVE BIO SURGEON STRL SZ 6.5 (GLOVE) ×2 IMPLANT
GLOVE BIOGEL PI IND STRL 6.5 (GLOVE) ×1 IMPLANT
GLOVE BIOGEL PI INDICATOR 6.5 (GLOVE) ×1
GOWN STRL REUS W/ TWL LRG LVL3 (GOWN DISPOSABLE) ×3 IMPLANT
GOWN STRL REUS W/TWL LRG LVL3 (GOWN DISPOSABLE) ×3
KIT MARKER MARGIN INK (KITS) ×1 IMPLANT
KIT TURNOVER KIT A (KITS) ×2 IMPLANT
LABEL OR SOLS (LABEL) ×2 IMPLANT
MARGIN MAP 10MM (MISCELLANEOUS) IMPLANT
MARKER MARGIN CORRECT CLIP (MARKER) ×1 IMPLANT
NDL HYPO 25X1 1.5 SAFETY (NEEDLE) ×1 IMPLANT
NEEDLE HYPO 25X1 1.5 SAFETY (NEEDLE) ×2 IMPLANT
PACK BASIN MINOR (MISCELLANEOUS) ×2 IMPLANT
RETRACTOR RING XSMALL (MISCELLANEOUS) IMPLANT
RTRCTR WOUND ALEXIS 13CM XS SH (MISCELLANEOUS) ×2
SET LOCALIZER 20 PROBE US (MISCELLANEOUS) ×2 IMPLANT
SUT ETHILON 3-0 FS-10 30 BLK (SUTURE)
SUT MNCRL 4-0 (SUTURE) ×1
SUT MNCRL 4-0 27XMFL (SUTURE) ×1
SUT SILK 2 0 SH (SUTURE) ×1 IMPLANT
SUT VIC AB 3-0 SH 27 (SUTURE) ×1
SUT VIC AB 3-0 SH 27X BRD (SUTURE) ×1 IMPLANT
SUTURE EHLN 3-0 FS-10 30 BLK (SUTURE) IMPLANT
SUTURE MNCRL 4-0 27XMF (SUTURE) ×1 IMPLANT
SYR 10ML LL (SYRINGE) ×2 IMPLANT
SYR BULB IRRIG 60ML STRL (SYRINGE) ×2 IMPLANT
WATER STERILE IRR 1000ML POUR (IV SOLUTION) ×2 IMPLANT

## 2020-03-04 NOTE — Interval H&P Note (Signed)
History and Physical Interval Note:  03/04/2020 9:27 AM  Monica Delacruz  has presented today for surgery, with the diagnosis of Breast Calcification, left.  The various methods of treatment have been discussed with the patient and family. After consideration of risks, benefits and other options for treatment, the patient has consented to  Procedure(s): BREAST BIOPSY WITH RADIO FREQUENCY LOCALIZER (Left) as a surgical intervention.  The patient's history has been reviewed, patient examined, no change in status, stable for surgery.  I have reviewed the patient's chart and labs.  Left breast marked in the pre procedure room. Questions were answered to the patient's satisfaction.     Herbert Pun

## 2020-03-04 NOTE — Anesthesia Procedure Notes (Signed)
Procedure Name: LMA Insertion Date/Time: 03/04/2020 9:45 AM Performed by: Caryl Asp, CRNA Pre-anesthesia Checklist: Patient identified, Patient being monitored, Timeout performed, Emergency Drugs available and Suction available Patient Re-evaluated:Patient Re-evaluated prior to induction Oxygen Delivery Method: Circle system utilized Preoxygenation: Pre-oxygenation with 100% oxygen Induction Type: IV induction Ventilation: Mask ventilation without difficulty LMA: LMA inserted LMA Size: 4.0 Tube type: Oral Number of attempts: 1 Placement Confirmation: positive ETCO2 and breath sounds checked- equal and bilateral Tube secured with: Tape Dental Injury: Teeth and Oropharynx as per pre-operative assessment

## 2020-03-04 NOTE — Anesthesia Preprocedure Evaluation (Signed)
Anesthesia Evaluation  Patient identified by MRN, date of birth, ID band Patient awake    Reviewed: Allergy & Precautions, NPO status , Patient's Chart, lab work & pertinent test results  History of Anesthesia Complications Negative for: history of anesthetic complications  Airway Mallampati: II  TM Distance: >3 FB Neck ROM: Full    Dental  (+) Poor Dentition   Pulmonary neg pulmonary ROS, neg sleep apnea, neg COPD,    breath sounds clear to auscultation- rhonchi (-) wheezing      Cardiovascular Exercise Tolerance: Good (-) hypertension(-) CAD, (-) Past MI, (-) Cardiac Stents and (-) CABG  Rhythm:Regular Rate:Normal - Systolic murmurs and - Diastolic murmurs    Neuro/Psych  Headaches, neg Seizures negative psych ROS   GI/Hepatic negative GI ROS, Neg liver ROS,   Endo/Other  negative endocrine ROSneg diabetes  Renal/GU negative Renal ROS     Musculoskeletal  (+) Arthritis ,   Abdominal (+) - obese,   Peds  Hematology negative hematology ROS (+)   Anesthesia Other Findings Past Medical History: No date: Back pain     Comment:  lumbar fracture No date: Diverticulosis No date: Headache     Comment:  Migraine No date: History of bronchitis     Comment:  2014 No date: History of colon polyps No date: History of kidney stones No date: History of migraine     Comment:  last one about 2wks ago No date: Hyperlipidemia     Comment:  takes Lovasatin daily No date: Seasonal allergies     Comment:  takes Claritin daily and Flonase daily   Reproductive/Obstetrics                             Anesthesia Physical Anesthesia Plan  ASA: II  Anesthesia Plan: General   Post-op Pain Management:    Induction: Intravenous  PONV Risk Score and Plan: 2 and Ondansetron, Dexamethasone and Midazolam  Airway Management Planned: LMA  Additional Equipment:   Intra-op Plan:   Post-operative Plan:    Informed Consent: I have reviewed the patients History and Physical, chart, labs and discussed the procedure including the risks, benefits and alternatives for the proposed anesthesia with the patient or authorized representative who has indicated his/her understanding and acceptance.     Dental advisory given  Plan Discussed with: CRNA and Anesthesiologist  Anesthesia Plan Comments:         Anesthesia Quick Evaluation

## 2020-03-04 NOTE — Transfer of Care (Signed)
Immediate Anesthesia Transfer of Care Note  Patient: Monica Delacruz  Procedure(s) Performed: BREAST BIOPSY WITH RADIO FREQUENCY LOCALIZER (Left Breast)  Patient Location: PACU  Anesthesia Type:General  Level of Consciousness: drowsy  Airway & Oxygen Therapy: Patient Spontanous Breathing and Patient connected to face mask oxygen  Post-op Assessment: Report given to RN and Post -op Vital signs reviewed and stable  Post vital signs: Reviewed and stable  Last Vitals:  Vitals Value Taken Time  BP 135/77 03/04/20 1051  Temp 35.9 C 03/04/20 1051  Pulse 79 03/04/20 1053  Resp 14 03/04/20 1053  SpO2 99 % 03/04/20 1053  Vitals shown include unvalidated device data.  Last Pain:  Vitals:   03/04/20 1051  TempSrc:   PainSc: Asleep         Complications: No apparent anesthesia complications

## 2020-03-04 NOTE — Anesthesia Postprocedure Evaluation (Signed)
Anesthesia Post Note  Patient: Monica Delacruz  Procedure(s) Performed: BREAST BIOPSY WITH RADIO FREQUENCY LOCALIZER (Left Breast)  Patient location during evaluation: PACU Anesthesia Type: General Level of consciousness: awake and alert and oriented Pain management: pain level controlled Vital Signs Assessment: post-procedure vital signs reviewed and stable Respiratory status: spontaneous breathing, nonlabored ventilation and respiratory function stable Cardiovascular status: blood pressure returned to baseline and stable Postop Assessment: no signs of nausea or vomiting Anesthetic complications: no     Last Vitals:  Vitals:   03/04/20 1138 03/04/20 1214  BP: (!) 148/79 139/67  Pulse: 72 83  Resp: 18 16  Temp: (!) 36.2 C (!) 36.3 C  SpO2: 100% 99%    Last Pain:  Vitals:   03/04/20 1214  TempSrc: Temporal  PainSc: 4                  Tamari Busic

## 2020-03-04 NOTE — Op Note (Signed)
Preoperative diagnosis: Left breast mucocele lesion.  Postoperative diagnosis: Left breast mucocele lesion.   Procedure: Left radiofrequency tag-localized excisional biopsy                       Anesthesia: GETA  Surgeon: Dr. Windell Moment  Wound Classification: Clean  Indications: Patient is a 69 y.o. female with a nonpalpable left breast mass noted on mammography with core biopsy demonstrating mucocele lesion that invasive mucocele carcinoma was not able to be ruled out. Requires radiofrequency tag-localized excisional biopsy to rule out malignancy.   Findings: 1. Specimen mammography shows marker and tag on specimen 2. Pathology call refers gross examination of margins was no discrete mass identified. Biopsy cavity in center.  3. No other palpable mass or lymph node identified.   Description of procedure: Preoperative radiofrequency tag localization was performed by radiology. Localization studies were reviewed. The patient was taken to the operating room and placed supine on the operating table, and after general anesthesia the left chest and axilla were prepped and draped in the usual sterile fashion. A time-out was completed verifying correct patient, procedure, site, positioning, and implant(s) and/or special equipment prior to beginning this procedure.  By comparing the localization studies and interrogation with Localizer device, the probable trajectory and location of the mass was visualized. A circumareolar skin incision was planned in such a way as to minimize the amount of dissection to reach the mass.  The skin incision was made. Flaps were raised and the location of the tag was confirmed with Localizer device confirmed. A 2-0 silk figure-of-eight stay suture was placed and used for retraction. Dissection was then taken down circumferentially, taking care to include the entire localizing tag and a wide margin of grossly normal tissue. The specimen and entire localizing tag were  removed. The specimen was oriented and sent to radiology with the localization studies. Confirmation was received that the entire target lesion had been resected. The wound was irrigated. Hemostasis was checked. The wound was closed with interrupted sutures of 3-0 Vicryl and a subcuticular suture of Monocryl 4-0. No attempt was made to close the dead space.   Specimen: Left Breast excisional biopsy                      Complications: None  Estimated Blood Loss: 5 mL

## 2020-03-04 NOTE — Discharge Instructions (Signed)
°  Diet: Resume home heart healthy regular diet.  ° °Activity: Increase activity as tolerated. Light activity and walking are encouraged. Do not drive or drink alcohol if taking narcotic pain medications. ° °Wound care: May shower with soapy water and pat dry (do not rub incisions), but no baths or submerging incision underwater until follow-up. (no swimming)  ° °Medications: Resume all home medications. For mild to moderate pain: acetaminophen (Tylenol) or ibuprofen (if no kidney disease). Combining Tylenol with alcohol can substantially increase your risk of causing liver disease. Narcotic pain medications, if prescribed, can be used for severe pain, though may cause nausea, constipation, and drowsiness. Do not combine Tylenol and Norco within a 6 hour period as Norco contains Tylenol. If you do not need the narcotic pain medication, you do not need to fill the prescription. ° °Call office ([phone]) at any time if any questions, worsening pain, fevers/chills, bleeding, drainage from incision site, or other concerns. ° °AMBULATORY SURGERY  °DISCHARGE INSTRUCTIONS ° ° °1) The drugs that you were given will stay in your system until tomorrow so for the next 24 hours you should not: ° °A) Drive an automobile °B) Make any legal decisions °C) Drink any alcoholic beverage ° ° °2) You may resume regular meals tomorrow.  Today it is better to start with liquids and gradually work up to solid foods. ° °You may eat anything you prefer, but it is better to start with liquids, then soup and crackers, and gradually work up to solid foods. ° ° °3) Please notify your doctor immediately if you have any unusual bleeding, trouble breathing, redness and pain at the surgery site, drainage, fever, or pain not relieved by medication. ° ° ° °4) Additional Instructions: ° ° ° ° ° ° ° °Please contact your physician with any problems or Same Day Surgery at [phone], Monday through Friday 6 am to 4 pm, or [place] [facility] [place] [facility]  number at [phone]. °

## 2020-03-05 ENCOUNTER — Telehealth: Payer: Self-pay

## 2020-03-05 LAB — URINE CULTURE

## 2020-03-05 MED ORDER — NITROFURANTOIN MONOHYD MACRO 100 MG PO CAPS: 100.0000 mg | ORAL_CAPSULE | Freq: Two times a day (BID) | ORAL | 0 refills | Status: DC

## 2020-03-05 NOTE — Telephone Encounter (Signed)
Patient returned call and was read all messages from [facility].  She verbalized understanding and states that her pharmacy has alreadreached out to her.

## 2020-03-05 NOTE — Telephone Encounter (Signed)
-----   Message from Mar Daring, [place] sent at 03/05/2020  2:21 PM EDT ----- Urine culture is positive for e.coli bacteria. There may be some resistance to the antibiotic I placed you on. I would recommend [place] change therapy to cipro.

## 2020-03-05 NOTE — Telephone Encounter (Signed)
LMTCB if patient calls back OK for [facility] nurse to give results 

## 2020-03-05 NOTE — Telephone Encounter (Signed)
-----   Message from Mar Daring, [place] sent at 03/05/2020  2:23 PM EDT ----- Actually changed therapy to macrobid due to nausea from ofloxacin

## 2020-03-05 NOTE — Addendum Note (Signed)
Addended by: Mar Daring on: 03/05/2020 02:22 PM   Modules accepted: Orders

## 2020-03-05 NOTE — Telephone Encounter (Signed)
OK for [facility] nurse to give results

## 2020-03-06 ENCOUNTER — Other Ambulatory Visit: Payer: Medicare Other

## 2020-03-06 LAB — SURGICAL PATHOLOGY

## 2020-03-11 ENCOUNTER — Other Ambulatory Visit: Payer: Self-pay | Admitting: Physician Assistant

## 2020-03-11 NOTE — Telephone Encounter (Signed)
Medication Refill - Medication: fluconazole  Has the patient contacted their pharmacy? No. (Agent: If no, request that the patient contact the pharmacy for the refill.) (Agent: If yes, when and what did the pharmacy advise?)  Preferred Pharmacy (with phone number or street name):[facility] [place], [place]: Please be advised that RX refills may take up to 3 business days. We ask that you follow-up with your pharmacy.

## 2020-03-12 MED ORDER — FLUCONAZOLE 150 MG PO TABS
150.0000 mg | ORAL_TABLET | Freq: Once | ORAL | 0 refills | Status: AC
Start: 2020-03-12 — End: 2020-03-12

## 2020-03-12 NOTE — Telephone Encounter (Signed)
Patient advised as below.  

## 2020-03-12 NOTE — Telephone Encounter (Signed)
OK to send in.  If does not imrpove with treatment, needs evaluation.  Take 1 pill once. 0 refills.

## 2020-03-26 ENCOUNTER — Other Ambulatory Visit: Payer: Self-pay | Admitting: Physician Assistant

## 2020-03-26 ENCOUNTER — Other Ambulatory Visit: Payer: Self-pay | Admitting: Family Medicine

## 2020-03-26 DIAGNOSIS — N200 Calculus of kidney: Secondary | ICD-10-CM

## 2020-06-01 DIAGNOSIS — Z20822 Contact with and (suspected) exposure to covid-19: Secondary | ICD-10-CM | POA: Diagnosis not present

## 2020-07-20 DIAGNOSIS — H524 Presbyopia: Secondary | ICD-10-CM | POA: Diagnosis not present

## 2020-07-20 DIAGNOSIS — H5203 Hypermetropia, bilateral: Secondary | ICD-10-CM | POA: Diagnosis not present

## 2020-07-20 DIAGNOSIS — H35372 Puckering of macula, left eye: Secondary | ICD-10-CM | POA: Diagnosis not present

## 2020-07-20 DIAGNOSIS — H52223 Regular astigmatism, bilateral: Secondary | ICD-10-CM | POA: Diagnosis not present

## 2020-08-20 NOTE — Progress Notes (Signed)
Subjective:   Monica Delacruz is a 69 y.o. female who presents for Medicare Annual (Subsequent) preventive examination.  I connected with Rayni Nemitz today by telephone and verified that I am speaking with the correct person using two identifiers. Location patient: home Location provider: work Persons participating in the virtual visit: patient, provider.   I discussed the limitations, risks, security and privacy concerns of performing an evaluation and management service by telephone and the availability of in person appointments. I also discussed with the patient that there may be a patient responsible charge related to this service. The patient expressed understanding and verbally consented to this telephonic visit.    Interactive audio and video telecommunications were attempted between this provider and patient, however failed, due to patient having technical difficulties OR patient did not have access to video capability.  We continued and completed visit with audio only.   Review of Systems    N/A  Cardiac Risk Factors include: advanced age (>83men, >70 women);dyslipidemia     Objective:    There were no vitals filed for this visit. There is no height or weight on file to calculate BMI.  Advanced Directives 08/24/2020 02/25/2020 09/27/2016 09/23/2016 08/29/2016 08/29/2016 08/28/2016  Does Patient Have a Medical Advance Directive? No No No No No No No  Would patient like information on creating a medical advance directive? No - Patient declined - No - Patient declined No - Patient declined - Yes - Spiritual care consult ordered -  Pre-existing out of facility DNR order (yellow form or pink MOST form) - - - - - - -    Current Medications (verified) Outpatient Encounter Medications as of 08/24/2020  Medication Sig  . amoxicillin (AMOXIL) 500 MG tablet Take 2,000 mg by mouth See admin instructions. For dental procedures  . ascorbic acid (VITAMIN C) 1000 MG tablet Take 1,000 mg by  mouth daily.  . butalbital-acetaminophen-caffeine (FIORICET) 50-325-40 MG tablet Take 1 tablet by mouth every 6 (six) hours as needed for migraine.  . cetirizine (ZYRTEC) 10 MG tablet Take 10 mg by mouth daily.  . fluticasone (FLONASE) 50 MCG/ACT nasal spray Place 1 spray into both nostrils daily as needed for allergies or rhinitis.  Marland Kitchen ibuprofen (ADVIL) 200 MG tablet Take 600 mg by mouth every 6 (six) hours as needed for moderate pain.  Marland Kitchen lovastatin (MEVACOR) 40 MG tablet Take 1 tablet (40 mg total) by mouth daily.  Marland Kitchen MEGARED OMEGA-3 KRILL OIL PO Take 1 tablet by mouth daily.   . vitamin B-12 (CYANOCOBALAMIN) 100 MCG tablet Take 100 mcg by mouth daily.  Marland Kitchen VITAMIN D PO Take 3 tablets by mouth daily. 600 units per  . Zinc Sulfate (ZINC-220 PO) Take by mouth daily.  Marland Kitchen amoxicillin-clavulanate (AUGMENTIN) 875-125 MG tablet Take 1 tablet by mouth 2 (two) times daily. (Patient not taking: Reported on 08/24/2020)  . dutasteride (AVODART) 0.5 MG capsule TAKE 1 CAPSULE(0.5 MG) BY MOUTH DAILY (Patient not taking: TAKE 1 CAPSULE(0.5 MG) BY MOUTH DAILY)  . HYDROcodone-acetaminophen (NORCO/VICODIN) 5-325 MG tablet Take 1 tablet by mouth every 6 (six) hours as needed for moderate pain. (Patient not taking: Reported on 08/24/2020)  . nitrofurantoin, macrocrystal-monohydrate, (MACROBID) 100 MG capsule Take 1 capsule (100 mg total) by mouth 2 (two) times daily. (Patient not taking: Reported on 08/24/2020)  . ondansetron (ZOFRAN) 4 MG tablet Take 1 tablet (4 mg total) by mouth every 8 (eight) hours as needed. (Patient not taking: Reported on 03/04/2020)   No facility-administered encounter medications  on file as of 08/24/2020.    Allergies (verified) Tramadol, Aspirin, Morphine and related, Ofloxacin, Oxycodone hcl, Simvastatin, Sulfa antibiotics, and Adhesive [tape]   History: Past Medical History:  Diagnosis Date  . Back pain    lumbar fracture  . Diverticulosis   . Headache    Migraine  . History of  bronchitis    2014  . History of colon polyps   . History of kidney stones   . History of migraine    last one about 2wks ago  . Hyperlipidemia    takes Lovasatin daily  . Seasonal allergies    takes Claritin daily and Flonase daily   Past Surgical History:  Procedure Laterality Date  . ABDOMINAL HYSTERECTOMY  1985  . BACK SURGERY    . BREAST BIOPSY Left 02/12/2020   Stereo Bx, X-clip, pending path   . BREAST BIOPSY WITH RADIO FREQUENCY LOCALIZER Left 03/04/2020   Procedure: BREAST BIOPSY WITH RADIO FREQUENCY LOCALIZER;  Surgeon: Herbert Pun, MD;  Location: [facility];  Service: General;  Laterality: Left;  . COLONOSCOPY    . CYSTOSCOPY    . CYSTOSCOPY W/ URETERAL STENT PLACEMENT Right 10/03/2016   Procedure: CYSTOSCOPY WITH STENT REPLACEMENT;  Surgeon: Hollice Espy, MD;  Location: [facility];  Service: Urology;  Laterality: Right;  . CYSTOSCOPY/RETROGRADE/URETEROSCOPY Right 08/29/2016   Procedure: CYSTOSCOPY/RETROGRADE/stent placement;  Surgeon: Alexis Frock, MD;  Location: [facility];  Service: Urology;  Laterality: Right;  . FOOT SURGERY     bilateral  . JOINT REPLACEMENT Left 2009   knee  . LAMINECTOMY WITH POSTERIOR LATERAL ARTHRODESIS LEVEL 4 N/A 07/12/2013   Procedure: Thoracic Ten Through Lumbar Three posterior lateral fusion with instrumentation;  Surgeon: Winfield Cunas, MD;  Location: [facility];  Service: Neurosurgery;  Laterality: N/A;  Thoracic Ten Through Lumbar Three posterior lateral fusion with instrumentation  . LITHOTRIPSY     x 2  . NASAL SINUS SURGERY     x 2  . URETEROSCOPY WITH HOLMIUM LASER LITHOTRIPSY Right 10/03/2016   Procedure: URETEROSCOPY WITH HOLMIUM LASER LITHOTRIPSY;  Surgeon: Hollice Espy, MD;  Location: [facility];  Service: Urology;  Laterality: Right;   Family History  Problem Relation Age of Onset  . Hematuria Mother   . Diabetes type II Mother   . Hypertension Father   . Stomach cancer Father   . Breast cancer Maternal  Grandmother    Social History   Socioeconomic History  . Marital status: Married    Spouse name: Not on file  . Number of children: 2  . Years of education: Not on file  . Highest education level: Some college, no degree  Occupational History  . Occupation: retired  Tobacco Use  . Smoking status: Never Smoker  . Smokeless tobacco: Never Used  Vaping Use  . Vaping Use: Never used  Substance and Sexual Activity  . Alcohol use: Yes    Comment: wine rarely  . Drug use: No  . Sexual activity: Yes    Birth control/protection: Surgical  Other Topics Concern  . Not on file  Social History Narrative  . Not on file   Social Determinants of Health   Financial Resource Strain: Low Risk   . Difficulty of Paying Living Expenses: Not hard at all  Food Insecurity: No Food Insecurity  . Worried About Charity fundraiser in the Last Year: Never true  . Ran Out of Food in the Last Year: Never true  Transportation Needs: No Transportation Needs  .  Lack of Transportation (Medical): No  . Lack of Transportation (Non-Medical): No  Physical Activity: Sufficiently Active  . Days of Exercise per Week: 5 days  . Minutes of Exercise per Session: 30 min  Stress: No Stress Concern Present  . Feeling of Stress : Not at all  Social Connections: Moderately Integrated  . Frequency of Communication with Friends and Family: More than three times a week  . Frequency of Social Gatherings with Friends and Family: More than three times a week  . Attends Religious Services: More than 4 times per year  . Active Member of Clubs or Organizations: No  . Attends Archivist Meetings: Never  . Marital Status: Married    Tobacco Counseling Counseling given: Not Answered   Clinical Intake:  Pre-visit preparation completed: Yes  Pain : No/denies pain     Nutritional Risks: None Diabetes: No  How often do you need to have someone help you when you read instructions, pamphlets, or other  written materials from your doctor or pharmacy?: 1 - Never  Diabetic? No  Interpreter Needed?: No  Information entered by :: [facility], LPN   Activities of Daily Living In your present state of health, do you have any difficulty performing the following activities: 08/24/2020 02/25/2020  Hearing? N N  Vision? N N  Difficulty concentrating or making decisions? N N  Walking or climbing stairs? N N  Dressing or bathing? N N  Doing errands, shopping? N N  Preparing Food and eating ? N -  Using the Toilet? N -  In the past six months, have you accidently leaked urine? Y -  Comment Occasionally with pressure. -  Do you have problems with loss of bowel control? N -  Managing your Medications? N -  Managing your Finances? N -  Housekeeping or managing your Housekeeping? N -  Some recent data might be hidden    Patient Care Team: Rubye Beach as PCP - General (Family Medicine) System, Provider Not In  Indicate any recent Medical Services you may have received from other than [facility] providers in the past year (date may be approximate).     Assessment:   This is a routine wellness examination for [place].  Hearing/Vision screen No exam data present  Dietary issues and exercise activities discussed: Current Exercise Habits: Home exercise routine, Type of exercise: walking, Time (Minutes): 30, Frequency (Times/Week): 5, Weekly Exercise (Minutes/Week): 150, Intensity: Mild, Exercise limited by: None identified  Goals    . DIET - REDUCE SUGAR INTAKE     Recommend to start back on low carbohydrate diet to aid with with weight loss.       Depression Screen PHQ 2/9 Scores 08/24/2020 08/20/2019 10/10/2017 10/14/2016  PHQ - 2 Score 0 0 0 0  PHQ- 9 Score - - 0 -    Fall Risk Fall Risk  08/24/2020 08/20/2019 10/10/2017 10/14/2016  Falls in the past year? 0 0 No No  Number falls in past yr: 0 0 - -  Injury with Fall? 0 0 - -    Any stairs in or around the home? Yes  If  so, are there any without handrails? No  Home free of loose throw rugs in walkways, pet beds, electrical cords, etc? Yes  Adequate lighting in your home to reduce risk of falls? Yes   ASSISTIVE DEVICES UTILIZED TO PREVENT FALLS:  Life alert? No  Use of a cane, walker or w/c? No  Grab bars in the bathroom? No  Shower chair or bench in shower? Yes  Elevated toilet seat or a handicapped toilet? Yes    Cognitive Function:        Immunizations Immunization History  Administered Date(s) Administered  . Fluad Quad(high Dose 65+) 08/20/2019  . Influenza, High Dose Seasonal PF 10/10/2017  . Influenza,inj,Quad PF,6+ Mos 07/14/2013, 08/23/2016  . PFIZER SARS-COV-2 Vaccination 05/21/2020, 06/11/2020  . Pneumococcal Conjugate-13 10/10/2017  . Pneumococcal Polysaccharide-23 08/30/2016  . Tdap 06/17/2011    TDAP status: Up to date Flu Vaccine status: Declined, Education has been provided regarding the importance of this vaccine but patient still declined. Advised may receive this vaccine at local pharmacy or Health Dept. Aware to provide a copy of the vaccination record if obtained from local pharmacy or Health Dept. Verbalized acceptance and understanding. Pneumococcal vaccine status: Up to date Covid-19 vaccine status: Completed vaccines  Qualifies for Shingles Vaccine? Yes   Zostavax completed No   Shingrix Completed?: No.    Education has been provided regarding the importance of this vaccine. Patient has been advised to call insurance company to determine out of pocket expense if they have not yet received this vaccine. Advised may also receive vaccine at local pharmacy or Health Dept. Verbalized acceptance and understanding.  Screening Tests Health Maintenance  Topic Date Due  . COLONOSCOPY  08/04/2016  . INFLUENZA VACCINE  05/24/2020  . TETANUS/TDAP  06/16/2021  . PNA vac Low Risk Adult (2 of 2 - PPSV23) 08/30/2021  . MAMMOGRAM  01/27/2022  . DEXA SCAN  11/16/2022  . COVID-19  Vaccine  Completed  . Hepatitis C Screening  Completed    Health Maintenance  Health Maintenance Due  Topic Date Due  . COLONOSCOPY  08/04/2016  . INFLUENZA VACCINE  05/24/2020    Colorectal cancer screening: Referral to GI placed today. Pt aware the office will call re: appt. Mammogram status: Completed 02/12/20. Repeat every year Bone Density status: Completed 11/16/17. Results reflect: Bone density results: OSTEOPENIA. Repeat every 5 years.  Lung Cancer Screening: (Low Dose CT Chest recommended if Age 62-80 years, 30 pack-year currently smoking OR have quit w/in 15years.) does not qualify.    Additional Screening:  Hepatitis C Screening: Up to date  Vision Screening: Recommended annual ophthalmology exams for early detection of glaucoma and other disorders of the eye. Is the patient up to date with their annual eye exam?  Yes  Who is the provider or what is the name of the office in which the patient attends annual eye exams? [facility] If pt is not established with a provider, would they like to be referred to a provider to establish care? No .   Dental Screening: Recommended annual dental exams for proper oral hygiene  Community Resource Referral / Chronic Care Management: CRR required this visit?  No   CCM required this visit?  No      Plan:     I have personally reviewed and noted the following in the patient's chart:   . Medical and social history . Use of alcohol, tobacco or illicit drugs  . Current medications and supplements . Functional ability and status . Nutritional status . Physical activity . Advanced directives . List of other physicians . Hospitalizations, surgeries, and ER visits in previous 12 months . Vitals . Screenings to include cognitive, depression, and falls . Referrals and appointments  In addition, I have reviewed and discussed with patient certain preventive protocols, quality metrics, and best practice recommendations. A  written personalized care plan  for preventive services as well as general preventive health recommendations were provided to patient.     Zaylee Cornia [place], [place]   59[birth year]04   Nurse Notes: Pt to receive a flu shot at next in office apt. Colonoscopy referral placed today.

## 2020-08-24 ENCOUNTER — Ambulatory Visit (INDEPENDENT_AMBULATORY_CARE_PROVIDER_SITE_OTHER): Payer: Medicare Other

## 2020-08-24 ENCOUNTER — Other Ambulatory Visit: Payer: Self-pay

## 2020-08-24 DIAGNOSIS — Z86010 Personal history of colonic polyps: Secondary | ICD-10-CM | POA: Diagnosis not present

## 2020-08-24 DIAGNOSIS — Z0000 Encounter for general adult medical examination without abnormal findings: Secondary | ICD-10-CM | POA: Diagnosis not present

## 2020-08-24 DIAGNOSIS — Z1211 Encounter for screening for malignant neoplasm of colon: Secondary | ICD-10-CM

## 2020-08-24 NOTE — Patient Instructions (Signed)
Monica Delacruz , Thank you for taking time to come for your Medicare Wellness Visit. I appreciate your ongoing commitment to your health goals. Please review the following plan we discussed and let me know if I can assist you in the future.   Screening recommendations/referrals: Colonoscopy: Order placed today. Pt aware office will contact her to schedule apt.  Mammogram: Up to date, due 01/2021 Bone Density: Up to date, due 10/2022 Recommended yearly ophthalmology/optometry visit for glaucoma screening and checkup Recommended yearly dental visit for hygiene and checkup  Vaccinations: Influenza vaccine: Currently due. Will receive at next in office apt.  Pneumococcal vaccine: Com/pleted series Tdap vaccine: Up to date, due 05/2021  Shingles vaccine: Shingrix discussed. Please contact your pharmacy for coverage information.     Advanced directives: Advance directive discussed with you today. Even though you declined this today please call our office should you change your mind and we can give you the proper paperwork for you to fill out.  Conditions/risks identified: Recommend to start back on low carbohydrate diet to aid with with weight loss.   Next appointment: 10/14/20 @ 2:00 PM with [place] 65 Years and Older, Female Preventive care refers to lifestyle choices and visits with your health care provider that can promote health and wellness. What does preventive care include?  A yearly physical exam. This is also called an annual well check.  Dental exams once or twice a year.  Routine eye exams. Ask your health care provider how often you should have your eyes checked.  Personal lifestyle choices, including:  Daily care of your teeth and gums.  Regular physical activity.  Eating a healthy diet.  Avoiding tobacco and drug use.  Limiting alcohol use.  Practicing safe sex.  Taking low-dose aspirin every day.  Taking vitamin and mineral supplements  as recommended by your health care provider. What happens during an annual well check? The services and screenings done by your health care provider during your annual well check will depend on your age, overall health, lifestyle risk factors, and family history of disease. Counseling  Your health care provider may ask you questions about your:  Alcohol use.  Tobacco use.  Drug use.  Emotional well-being.  Home and relationship well-being.  Sexual activity.  Eating habits.  History of falls.  Memory and ability to understand (cognition).  Work and work Statistician.  Reproductive health. Screening  You may have the following tests or measurements:  Height, weight, and BMI.  Blood pressure.  Lipid and cholesterol levels. These may be checked every 5 years, or more frequently if you are over 3 years old.  Skin check.  Lung cancer screening. You may have this screening every year starting at age 34 if you have a 30-pack-year history of smoking and currently smoke or have quit within the past 15 years.  Fecal occult blood test (FOBT) of the stool. You may have this test every year starting at age 18.  Flexible sigmoidoscopy or colonoscopy. You may have a sigmoidoscopy every 5 years or a colonoscopy every 10 years starting at age 58.  Hepatitis C blood test.  Hepatitis B blood test.  Sexually transmitted disease (STD) testing.  Diabetes screening. This is done by checking your blood sugar (glucose) after you have not eaten for a while (fasting). You may have this done every 1-3 years.  Bone density scan. This is done to screen for osteoporosis. You may have this done starting at age 92.  Mammogram. This may be done every 1-2 years. Talk to your health care provider about how often you should have regular mammograms. Talk with your health care provider about your test results, treatment options, and if necessary, the need for more tests. Vaccines  Your health care  provider may recommend certain vaccines, such as:  Influenza vaccine. This is recommended every year.  Tetanus, diphtheria, and acellular pertussis (Tdap, Td) vaccine. You may need a Td booster every 10 years.  Zoster vaccine. You may need this after age 38.  Pneumococcal 13-valent conjugate (PCV13) vaccine. One dose is recommended after age 91.  Pneumococcal polysaccharide (PPSV23) vaccine. One dose is recommended after age 29. Talk to your health care provider about which screenings and vaccines you need and how often you need them. This information is not intended to replace advice given to you by your health care provider. Make sure you discuss any questions you have with your health care provider. Document Released: 11/06/2015 Document Revised: 06/29/2016 Document Reviewed: 08/11/2015 Elsevier Interactive Patient Education  2017 [employer] Prevention in the Home Falls can cause injuries. They can happen to people of all ages. There are many things you can do to make your home safe and to help prevent falls. What can I do on the outside of my home?  Regularly fix the edges of walkways and driveways and fix any cracks.  Remove anything that might make you trip as you walk through a door, such as a raised step or threshold.  Trim any bushes or trees on the path to your home.  Use bright outdoor lighting.  Clear any walking paths of anything that might make someone trip, such as rocks or tools.  Regularly check to see if handrails are loose or broken. Make sure that both sides of any steps have handrails.  Any raised decks and porches should have guardrails on the edges.  Have any leaves, snow, or ice cleared regularly.  Use sand or salt on walking paths during winter.  Clean up any spills in your garage right away. This includes oil or grease spills. What can I do in the bathroom?  Use night lights.  Install grab bars by the toilet and in the tub and shower. Do  not use towel bars as grab bars.  Use non-skid mats or decals in the tub or shower.  If you need to sit down in the shower, use a plastic, non-slip stool.  Keep the floor dry. Clean up any water that spills on the floor as soon as it happens.  Remove soap buildup in the tub or shower regularly.  Attach bath mats securely with double-sided non-slip rug tape.  Do not have throw rugs and other things on the floor that can make you trip. What can I do in the bedroom?  Use night lights.  Make sure that you have a light by your bed that is easy to reach.  Do not use any sheets or blankets that are too big for your bed. They should not hang down onto the floor.  Have a firm chair that has side arms. You can use this for support while you get dressed.  Do not have throw rugs and other things on the floor that can make you trip. What can I do in the kitchen?  Clean up any spills right away.  Avoid walking on wet floors.  Keep items that you use a lot in easy-to-reach places.  If you need to reach  something above you, use a strong step stool that has a grab bar.  Keep electrical cords out of the way.  Do not use floor polish or wax that makes floors slippery. If you must use wax, use non-skid floor wax.  Do not have throw rugs and other things on the floor that can make you trip. What can I do with my stairs?  Do not leave any items on the stairs.  Make sure that there are handrails on both sides of the stairs and use them. Fix handrails that are broken or loose. Make sure that handrails are as long as the stairways.  Check any carpeting to make sure that it is firmly attached to the stairs. Fix any carpet that is loose or worn.  Avoid having throw rugs at the top or bottom of the stairs. If you do have throw rugs, attach them to the floor with carpet tape.  Make sure that you have a light switch at the top of the stairs and the bottom of the stairs. If you do not have them,  ask someone to add them for you. What else can I do to help prevent falls?  Wear shoes that:  Do not have high heels.  Have rubber bottoms.  Are comfortable and fit you well.  Are closed at the toe. Do not wear sandals.  If you use a stepladder:  Make sure that it is fully opened. Do not climb a closed stepladder.  Make sure that both sides of the stepladder are locked into place.  Ask someone to hold it for you, if possible.  Clearly mark and make sure that you can see:  Any grab bars or handrails.  First and last steps.  Where the edge of each step is.  Use tools that help you move around (mobility aids) if they are needed. These include:  Canes.  Walkers.  Scooters.  Crutches.  Turn on the lights when you go into a dark area. Replace any light bulbs as soon as they burn out.  Set up your furniture so you have a clear path. Avoid moving your furniture around.  If any of your floors are uneven, fix them.  If there are any pets around you, be aware of where they are.  Review your medicines with your doctor. Some medicines can make you feel dizzy. This can increase your chance of falling. Ask your doctor what other things that you can do to help prevent falls. This information is not intended to replace advice given to you by your health care provider. Make sure you discuss any questions you have with your health care provider. Document Released: 08/06/2009 Document Revised: 03/17/2016 Document Reviewed: 11/14/2014 Elsevier Interactive Patient Education  2017 [employer].

## 2020-08-31 ENCOUNTER — Encounter: Payer: Self-pay | Admitting: *Deleted

## 2020-10-14 ENCOUNTER — Ambulatory Visit (INDEPENDENT_AMBULATORY_CARE_PROVIDER_SITE_OTHER): Payer: Medicare Other | Admitting: Physician Assistant

## 2020-10-14 ENCOUNTER — Encounter: Payer: Self-pay | Admitting: Physician Assistant

## 2020-10-14 ENCOUNTER — Other Ambulatory Visit: Payer: Self-pay

## 2020-10-14 VITALS — BP 148/88 | HR 75 | Temp 98.3°F | Ht 64.5 in | Wt 173.0 lb

## 2020-10-14 DIAGNOSIS — E7800 Pure hypercholesterolemia, unspecified: Secondary | ICD-10-CM

## 2020-10-14 DIAGNOSIS — Z0000 Encounter for general adult medical examination without abnormal findings: Secondary | ICD-10-CM | POA: Diagnosis not present

## 2020-10-14 DIAGNOSIS — Z1211 Encounter for screening for malignant neoplasm of colon: Secondary | ICD-10-CM | POA: Diagnosis not present

## 2020-10-14 DIAGNOSIS — Z23 Encounter for immunization: Secondary | ICD-10-CM

## 2020-10-14 DIAGNOSIS — Z1239 Encounter for other screening for malignant neoplasm of breast: Secondary | ICD-10-CM | POA: Diagnosis not present

## 2020-10-14 DIAGNOSIS — Z6829 Body mass index (BMI) 29.0-29.9, adult: Secondary | ICD-10-CM

## 2020-10-14 DIAGNOSIS — E559 Vitamin D deficiency, unspecified: Secondary | ICD-10-CM

## 2020-10-14 NOTE — Patient Instructions (Signed)
Preventive Care 68 Years and Older, Female Preventive care refers to lifestyle choices and visits with your health care provider that can promote health and wellness. This includes:  A yearly physical exam. This is also called an annual well check.  Regular dental and eye exams.  Immunizations.  Screening for certain conditions.  Healthy lifestyle choices, such as diet and exercise. What can I expect for my preventive care visit? Physical exam Your health care provider will check:  Height and weight. These may be used to calculate body mass index (BMI), which is a measurement that tells if you are at a healthy weight.  Heart rate and blood pressure.  Your skin for abnormal spots. Counseling Your health care provider may ask you questions about:  Alcohol, tobacco, and drug use.  Emotional well-being.  Home and relationship well-being.  Sexual activity.  Eating habits.  History of falls.  Memory and ability to understand (cognition).  Work and work Statistician.  Pregnancy and menstrual history. What immunizations do I need?  Influenza (flu) vaccine  This is recommended every year. Tetanus, diphtheria, and pertussis (Tdap) vaccine  You may need a Td booster every 10 years. Varicella (chickenpox) vaccine  You may need this vaccine if you have not already been vaccinated. Zoster (shingles) vaccine  You may need this after age 68. Pneumococcal conjugate (PCV13) vaccine  One dose is recommended after age 68. Pneumococcal polysaccharide (PPSV23) vaccine  One dose is recommended after age 68. Measles, mumps, and rubella (MMR) vaccine  You may need at least one dose of MMR if you were born in 1957 or later. You may also need a second dose. Meningococcal conjugate (MenACWY) vaccine  You may need this if you have certain conditions. Hepatitis A vaccine  You may need this if you have certain conditions or if you travel or work in places where you may be exposed  to hepatitis A. Hepatitis B vaccine  You may need this if you have certain conditions or if you travel or work in places where you may be exposed to hepatitis B. Haemophilus influenzae type b (Hib) vaccine  You may need this if you have certain conditions. You may receive vaccines as individual doses or as more than one vaccine together in one shot (combination vaccines). Talk with your health care provider about the risks and benefits of combination vaccines. What tests do I need? Blood tests  Lipid and cholesterol levels. These may be checked every 5 years, or more frequently depending on your overall health.  Hepatitis C test.  Hepatitis B test. Screening  Lung cancer screening. You may have this screening every year starting at age 68 if you have a 30-pack-year history of smoking and currently smoke or have quit within the past 15 years.  Colorectal cancer screening. All adults should have this screening starting at age 68 and continuing until age 15. Your health care provider may recommend screening at age 23 if you are at increased risk. You will have tests every 1-10 years, depending on your results and the type of screening test.  Diabetes screening. This is done by checking your blood sugar (glucose) after you have not eaten for a while (fasting). You may have this done every 1-3 years.  Mammogram. This may be done every 1-2 years. Talk with your health care provider about how often you should have regular mammograms.  BRCA-related cancer screening. This may be done if you have a family history of breast, ovarian, tubal, or peritoneal cancers.  Other tests  Sexually transmitted disease (STD) testing.  Bone density scan. This is done to screen for osteoporosis. You may have this done starting at age 68. Follow these instructions at home: Eating and drinking  Eat a diet that includes fresh fruits and vegetables, whole grains, lean protein, and low-fat dairy products. Limit  your intake of foods with high amounts of sugar, saturated fats, and salt.  Take vitamin and mineral supplements as recommended by your health care provider.  Do not drink alcohol if your health care provider tells you not to drink.  If you drink alcohol: ? Limit how much you have to 0-1 drink a day. ? Be aware of how much alcohol is in your drink. In the [place] one drink equals one 12 oz bottle of beer (355 mL), one 5 oz glass of wine (148 mL), or one 1 oz glass of hard liquor (44 mL). Lifestyle  Take daily care of your teeth and gums.  Stay active. Exercise for at least 30 minutes on 5 or more days each week.  Do not use any products that contain nicotine or tobacco, such as cigarettes, e-cigarettes, and chewing tobacco. If you need help quitting, ask your health care provider.  If you are sexually active, practice safe sex. Use a condom or other form of protection in order to prevent STIs (sexually transmitted infections).  Talk with your health care provider about taking a low-dose aspirin or statin. What's next?  Go to your health care provider once a year for a well check visit.  Ask your health care provider how often you should have your eyes and teeth checked.  Stay up to date on all vaccines. This information is not intended to replace advice given to you by your health care provider. Make sure you discuss any questions you have with your health care provider. Document Revised: 10/04/2018 Document Reviewed: 10/04/2018 Elsevier Patient Education  2020 [employer].

## 2020-10-14 NOTE — Progress Notes (Signed)
Annual Wellness Visit     Patient: Monica Delacruz, Female    DOB: 01/15/1951, 69 y.o.   MRN: [id number] Visit Date: 10/14/2020  Today's Provider: Mar Daring, PA-C   Chief Complaint  Patient presents with  . Annual Exam   Subjective    Monica Delacruz is a 69 y.o. female who presents today for her Annual Wellness Visit. She reports consuming a general diet. Not exercises  She generally feels well. She reports sleeping well. She does not have additional problems to discuss today.   HPI   Patient Active Problem List   Diagnosis Date Noted  . Migraine with aura and without status migrainosus, not intractable 10/19/2017  . Right ureteral stone 08/29/2016  . Abortion, spontaneous 08/23/2016  . Compression fracture 08/23/2016  . Esophagitis, reflux 08/23/2016  . Hemorrhoid 08/23/2016  . Arthritis, degenerative 08/23/2016  . RAD (reactive airway disease) 08/23/2016  . Hypercholesterolemia without hypertriglyceridemia 08/23/2016  . Neuralgia neuritis, sciatic nerve 08/23/2016  . Avitaminosis D 08/23/2016  . Low back pain 01/11/2010  . Herniation of nucleus pulposus 10/02/2009  . Osteopenia 10/02/2009   Past Medical History:  Diagnosis Date  . Back pain    lumbar fracture  . Diverticulosis   . Headache    Migraine  . History of bronchitis    2014  . History of colon polyps   . History of kidney stones   . History of migraine    last one about 2wks ago  . Hyperlipidemia    takes Lovasatin daily  . Seasonal allergies    takes Claritin daily and Flonase daily   Social History   Tobacco Use  . Smoking status: Never Smoker  . Smokeless tobacco: Never Used  Vaping Use  . Vaping Use: Never used  Substance Use Topics  . Alcohol use: Yes    Comment: wine rarely  . Drug use: No   Allergies  Allergen Reactions  . Tramadol Diarrhea, Nausea And Vomiting and Nausea Only    Other reaction(s): Vomiting  . Aspirin Other (See Comments)    Burning. Burns  stomach Other reaction(s): Abdominal Pain, Other (See Comments) Burning.  . Morphine And Related Itching  . Ofloxacin Nausea Only  . Oxycodone Hcl     Weird dreams  . Simvastatin     GI upset  . Sulfa Antibiotics Hives, Itching and Swelling  . Adhesive [Tape] Other (See Comments)    Blisters skin     Medications: Outpatient Medications Prior to Visit  Medication Sig  . butalbital-acetaminophen-caffeine (FIORICET) 50-325-40 MG tablet Take 1 tablet by mouth every 6 (six) hours as needed for migraine.  . cetirizine (ZYRTEC) 10 MG tablet Take 10 mg by mouth daily.  . fluticasone (FLONASE) 50 MCG/ACT nasal spray Place 1 spray into both nostrils daily as needed for allergies or rhinitis.  Marland Kitchen ibuprofen (ADVIL) 200 MG tablet Take 600 mg by mouth every 6 (six) hours as needed for moderate pain.  Marland Kitchen lovastatin (MEVACOR) 40 MG tablet Take 1 tablet (40 mg total) by mouth daily.  Marland Kitchen MEGARED OMEGA-3 KRILL OIL PO Take 1 tablet by mouth daily.   Marland Kitchen VITAMIN D PO Take 3 tablets by mouth daily. 600 units per  . Zinc Sulfate (ZINC-220 PO) Take by mouth daily.  . [DISCONTINUED] amoxicillin (AMOXIL) 500 MG tablet Take 2,000 mg by mouth See admin instructions. For dental procedures  . [DISCONTINUED] amoxicillin-clavulanate (AUGMENTIN) 875-125 MG tablet Take 1 tablet by mouth 2 (two) times daily.  . [  DISCONTINUED] ascorbic acid (VITAMIN C) 1000 MG tablet Take 1,000 mg by mouth daily.  . [DISCONTINUED] dutasteride (AVODART) 0.5 MG capsule TAKE 1 CAPSULE(0.5 MG) BY MOUTH DAILY (Patient not taking: TAKE 1 CAPSULE(0.5 MG) BY MOUTH DAILY)  . [DISCONTINUED] HYDROcodone-acetaminophen (NORCO/VICODIN) 5-325 MG tablet Take 1 tablet by mouth every 6 (six) hours as needed for moderate pain.  . [DISCONTINUED] nitrofurantoin, macrocrystal-monohydrate, (MACROBID) 100 MG capsule Take 1 capsule (100 mg total) by mouth 2 (two) times daily.  . [DISCONTINUED] ondansetron (ZOFRAN) 4 MG tablet Take 1 tablet (4 mg total) by mouth every  8 (eight) hours as needed.  . [DISCONTINUED] vitamin B-12 (CYANOCOBALAMIN) 100 MCG tablet Take 100 mcg by mouth daily.   No facility-administered medications prior to visit.    Allergies  Allergen Reactions  . Tramadol Diarrhea, Nausea And Vomiting and Nausea Only    Other reaction(s): Vomiting  . Aspirin Other (See Comments)    Burning. Burns stomach Other reaction(s): Abdominal Pain, Other (See Comments) Burning.  . Morphine And Related Itching  . Ofloxacin Nausea Only  . Oxycodone Hcl     Weird dreams  . Simvastatin     GI upset  . Sulfa Antibiotics Hives, Itching and Swelling  . Adhesive [Tape] Other (See Comments)    Blisters skin    Patient Care Team: Mar Daring, PA-C as PCP - General (Family Medicine) System, Provider Not In  Review of Systems  Constitutional: Negative.   HENT: Negative.   Eyes: Negative.   Respiratory: Negative.   Cardiovascular: Negative.   Gastrointestinal: Negative.   Endocrine: Negative.   Genitourinary: Negative.   Musculoskeletal: Negative.   Skin: Negative.   Allergic/Immunologic: Negative.   Neurological: Negative.   Hematological: Negative.   Psychiatric/Behavioral: Negative.       Objective    Vitals: BP (!) 148/88 (BP Location: Left Arm, Patient Position: Sitting, Cuff Size: Large)   Pulse 75   Temp 98.3 F (36.8 C) (Oral)   Ht 5' 4.5" (1.638 m)   Wt 173 lb (78.5 kg)   BMI 29.24 kg/m    Physical Exam Vitals reviewed.  Constitutional:      General: She is not in acute distress.    Appearance: Normal appearance. She is well-developed, well-groomed, overweight and well-nourished. She is not ill-appearing or diaphoretic.  HENT:     Head: Normocephalic and atraumatic.     Right Ear: Tympanic membrane, ear canal and external ear normal.     Left Ear: Tympanic membrane, ear canal and external ear normal.     Nose: Nose normal.     Mouth/Throat:     Mouth: Oropharynx is clear and moist. Mucous membranes are  moist.     Pharynx: Oropharynx is clear. No oropharyngeal exudate or posterior oropharyngeal erythema.  Eyes:     General: No scleral icterus.       Right eye: No discharge.        Left eye: No discharge.     Extraocular Movements: Extraocular movements intact and EOM normal.     Conjunctiva/sclera: Conjunctivae normal.     Pupils: Pupils are equal, round, and reactive to light.  Neck:     Thyroid: No thyromegaly.     Vascular: No carotid bruit or JVD.     Trachea: No tracheal deviation.  Cardiovascular:     Rate and Rhythm: Normal rate and regular rhythm.     Pulses: Normal pulses and intact distal pulses.     Heart sounds: No murmur heard. No  friction rub. No gallop.   Pulmonary:     Effort: Pulmonary effort is normal. No respiratory distress.     Breath sounds: Normal breath sounds. No wheezing or rales.  Chest:     Chest wall: No tenderness.  Abdominal:     General: Abdomen is flat. Bowel sounds are normal. There is no distension.     Palpations: Abdomen is soft. There is no mass.     Tenderness: There is no abdominal tenderness. There is no guarding or rebound.  Musculoskeletal:        General: No tenderness or edema. Normal range of motion.     Cervical back: Normal range of motion and neck supple. No tenderness.     Right lower leg: No edema.     Left lower leg: No edema.  Lymphadenopathy:     Cervical: No cervical adenopathy.  Skin:    General: Skin is warm and dry.     Capillary Refill: Capillary refill takes less than 2 seconds.     Findings: No rash.  Neurological:     General: No focal deficit present.     Mental Status: She is alert and oriented to person, place, and time. Mental status is at baseline.  Psychiatric:        Mood and Affect: Mood and affect and mood normal.        Behavior: Behavior normal. Behavior is cooperative.        Thought Content: Thought content normal.        Judgment: Judgment normal.     Most recent functional status  assessment: In your present state of health, do you have any difficulty performing the following activities: 10/14/2020  Hearing? N  Vision? N  Difficulty concentrating or making decisions? N  Walking or climbing stairs? N  Dressing or bathing? N  Doing errands, shopping? N  Preparing Food and eating ? -  Using the Toilet? -  In the past six months, have you accidently leaked urine? -  Comment -  Do you have problems with loss of bowel control? -  Managing your Medications? -  Managing your Finances? -  Housekeeping or managing your Housekeeping? -  Some recent data might be hidden   Most recent fall risk assessment: Fall Risk  10/14/2020  Falls in the past year? 0  Number falls in past yr: 0  Injury with Fall? 0  Risk for fall due to : No Fall Risks  Follow up Falls evaluation completed    Most recent depression screenings: PHQ 2/9 Scores 10/14/2020 08/24/2020  PHQ - 2 Score 0 0  PHQ- 9 Score 0 -   Most recent cognitive screening: No flowsheet data found. Most recent Audit-C alcohol use screening Alcohol Use Disorder Test (AUDIT) 10/14/2020  1. How often do you have a drink containing alcohol? 1  2. How many drinks containing alcohol do you have on a typical day when you are drinking? 0  3. How often do you have six or more drinks on one occasion? 0  AUDIT-C Score 1  Alcohol Brief Interventions/Follow-up AUDIT Score <7 follow-up not indicated   A score of 3 or more in women, and 4 or more in men indicates increased risk for alcohol abuse, EXCEPT if all of the points are from question 1   No results found for any visits on 10/14/20.  Assessment & Plan     Annual wellness visit done today including the all of the following: Reviewed patient's Family Medical  History Reviewed and updated list of patient's medical providers Assessment of cognitive impairment was done Assessed patient's functional ability Established a written schedule for health screening  [place] Completed and Reviewed  Exercise Activities and Dietary recommendations Goals    . DIET - REDUCE SUGAR INTAKE     Recommend to start back on low carbohydrate diet to aid with with weight loss.        Immunization History  Administered Date(s) Administered  . Fluad Quad(high Dose 65+) 08/20/2019, 10/14/2020  . Influenza, High Dose Seasonal PF 10/10/2017  . Influenza,inj,Quad PF,6+ Mos 07/14/2013, 08/23/2016  . PFIZER SARS-COV-2 Vaccination 05/21/2020, 06/11/2020  . Pneumococcal Conjugate-13 10/10/2017  . Pneumococcal Polysaccharide-23 08/30/2016  . Tdap 06/17/2011    Health Maintenance  Topic Date Due  . COLONOSCOPY  08/04/2016  . COVID-19 Vaccine (3 - Booster for Pfizer series) 12/12/2020  . TETANUS/TDAP  06/16/2021  . PNA vac Low Risk Adult (2 of 2 - PPSV23) 08/30/2021  . MAMMOGRAM  01/27/2022  . DEXA SCAN  11/16/2022  . INFLUENZA VACCINE  Completed  . Hepatitis C Screening  Completed     Discussed health benefits of physical activity, and encouraged her to engage in regular exercise appropriate for her age and condition.    1. Annual physical exam Normal physical exam today. Will check labs as below and f/u pending lab results. If labs are stable and WNL she will not need to have these rechecked for one year at her next annual physical exam. She is to call the office in the meantime if she has any acute issue, questions or concerns.  2. Encounter for breast cancer screening using non-mammogram modality Had abnormal mammogram that required 2 biopsies in April and May 2021. Was to have annual diagnostic mammogram in follow up. Due in March 2022. Ordered as below.  - MM DIAG BREAST TOMO BILATERAL; Future  3. Colon cancer screening Due, but wants to await til next year to decide if she gets it done. Will call if desires sooner.  4. Need for influenza vaccination Flu vaccine given today without complication. Patient sat upright for 15  minutes to check for adverse reaction before being released. - Flu Vaccine QUAD High Dose(Fluad)  5. Hypercholesterolemia without hypertriglyceridemia Stable on Lovastatin 40mg . Will check labs as below and f/u pending results. - CBC w/Diff/Platelet - Comprehensive Metabolic Panel (CMET) - Lipid Panel With LDL/HDL Ratio  6. Avitaminosis D H/O this, postmenopausal with osteopenia. Will check labs as below and f/u pending results. - Vitamin D (25 hydroxy)  7. BMI 29.0-29.9,adult Counseled patient on healthy lifestyle modifications including dieting and exercise.  Will check labs as below and f/u pending results. - CBC w/Diff/Platelet - Comprehensive Metabolic Panel (CMET) - Lipid Panel With LDL/HDL Ratio   No follow-ups on file.     Reynolds Bowl, PA-C, have reviewed all documentation for this visit. The documentation on 10/14/20 for the exam, diagnosis, procedures, and orders are all accurate and complete.   Rubye Beach  [facility] [phone] (phone) [phone] (fax)  [place]

## 2020-10-19 DIAGNOSIS — Z6829 Body mass index (BMI) 29.0-29.9, adult: Secondary | ICD-10-CM | POA: Diagnosis not present

## 2020-10-19 DIAGNOSIS — E7800 Pure hypercholesterolemia, unspecified: Secondary | ICD-10-CM | POA: Diagnosis not present

## 2020-10-19 DIAGNOSIS — E559 Vitamin D deficiency, unspecified: Secondary | ICD-10-CM | POA: Diagnosis not present

## 2020-10-20 LAB — CBC WITH DIFFERENTIAL/PLATELET
Basophils Absolute: 0.1 10*3/uL (ref 0.0–0.2)
Basos: 1 %
EOS (ABSOLUTE): 0.1 10*3/uL (ref 0.0–0.4)
Eos: 1 %
Hematocrit: 41.9 % (ref 34.0–46.6)
Hemoglobin: 13.5 g/dL (ref 11.1–15.9)
Immature Grans (Abs): 0 10*3/uL (ref 0.0–0.1)
Immature Granulocytes: 0 %
Lymphocytes Absolute: 2.4 10*3/uL (ref 0.7–3.1)
Lymphs: 44 %
MCH: 30 pg (ref 26.6–33.0)
MCHC: 32.2 g/dL (ref 31.5–35.7)
MCV: 93 fL (ref 79–97)
Monocytes Absolute: 0.5 10*3/uL (ref 0.1–0.9)
Monocytes: 9 %
Neutrophils Absolute: 2.5 10*3/uL (ref 1.4–7.0)
Neutrophils: 45 %
Platelets: 254 10*3/uL (ref 150–450)
RBC: 4.5 x10E6/uL (ref 3.77–5.28)
RDW: 12.7 % (ref 11.7–15.4)
WBC: 5.6 10*3/uL (ref 3.4–10.8)

## 2020-10-20 LAB — LIPID PANEL WITH LDL/HDL RATIO
Cholesterol, Total: 192 mg/dL (ref 100–199)
HDL: 67 mg/dL (ref >39)
LDL Chol Calc (NIH): 113 mg/dL — ABNORMAL HIGH (ref 0–99)
LDL/HDL Ratio: 1.7 ratio (ref 0.0–3.2)
Triglycerides: 66 mg/dL (ref 0–149)
VLDL Cholesterol Cal: 12 mg/dL (ref 5–40)

## 2020-10-20 LAB — COMPREHENSIVE METABOLIC PANEL
ALT: 19 IU/L (ref 0–32)
AST: 18 IU/L (ref 0–40)
Albumin/Globulin Ratio: 1.8 (ref 1.2–2.2)
Albumin: 4.2 g/dL (ref 3.8–4.8)
Alkaline Phosphatase: 78 IU/L (ref 44–121)
BUN/Creatinine Ratio: 19 (ref 12–28)
BUN: 15 mg/dL (ref 8–27)
Bilirubin Total: 0.3 mg/dL (ref 0.0–1.2)
CO2: 20 mmol/L (ref 20–29)
Calcium: 9 mg/dL (ref 8.7–10.3)
Chloride: 108 mmol/L — ABNORMAL HIGH (ref 96–106)
Creatinine, Ser: 0.79 mg/dL (ref 0.57–1.00)
GFR calc Af Amer: 89 mL/min/{1.73_m2} (ref >59)
GFR calc non Af Amer: 77 mL/min/{1.73_m2} (ref >59)
Globulin, Total: 2.3 g/dL (ref 1.5–4.5)
Glucose: 93 mg/dL (ref 65–99)
Potassium: 4.3 mmol/L (ref 3.5–5.2)
Sodium: 143 mmol/L (ref 134–144)
Total Protein: 6.5 g/dL (ref 6.0–8.5)

## 2020-10-20 LAB — VITAMIN D 25 HYDROXY (VIT D DEFICIENCY, FRACTURES): Vit D, 25-Hydroxy: 59.7 ng/mL (ref 30.0–100.0)

## 2020-12-01 ENCOUNTER — Other Ambulatory Visit: Payer: Self-pay | Admitting: General Surgery

## 2020-12-01 DIAGNOSIS — Z1231 Encounter for screening mammogram for malignant neoplasm of breast: Secondary | ICD-10-CM

## 2021-02-10 ENCOUNTER — Ambulatory Visit
Admission: RE | Admit: 2021-02-10 | Discharge: 2021-02-10 | Disposition: A | Discharge status: Home / Self Care | Payer: Medicare Other | Source: Ambulatory Visit | Attending: General Surgery | Admitting: General Surgery

## 2021-02-10 ENCOUNTER — Other Ambulatory Visit: Payer: Self-pay

## 2021-02-10 DIAGNOSIS — Z1231 Encounter for screening mammogram for malignant neoplasm of breast: Secondary | ICD-10-CM | POA: Diagnosis not present

## 2021-02-28 ENCOUNTER — Other Ambulatory Visit: Payer: Self-pay | Admitting: Family Medicine

## 2021-02-28 NOTE — Telephone Encounter (Signed)
Requested Prescriptions  Pending Prescriptions Disp Refills  . lovastatin (MEVACOR) 40 MG tablet [Pharmacy Med Name: LOVASTATIN 40MG  TABLETS] 90 tablet 2    Sig: TAKE 1 TABLET(40 MG) BY MOUTH DAILY     Cardiovascular:  Antilipid - Statins Failed - 02/28/2021  3:11 AM      Failed - LDL in normal range and within 360 days    LDL Cholesterol (Calc)  Date Value Ref Range Status  10/20/2017 108 (H) mg/dL (calc) Final    Comment:    Reference range: <100 . Desirable range <100 mg/dL for primary prevention;   <70 mg/dL for patients with CHD or diabetic patients  with > or = 2 CHD risk factors. Marland Kitchen LDL-C is now calculated using the Martin-Hopkins  calculation, which is a validated novel method providing  better accuracy than the Friedewald equation in the  estimation of LDL-C.  Cresenciano Genre et al. Annamaria Helling. 4944;967(59): 2061-2068  (http://education.[facility].com/faq/FAQ164)    LDL Chol Calc (NIH)  Date Value Ref Range Status  10/19/2020 113 (H) 0 - 99 mg/dL Final         Passed - Total Cholesterol in normal range and within 360 days    Cholesterol, Total  Date Value Ref Range Status  10/19/2020 192 100 - 199 mg/dL Final         Passed - HDL in normal range and within 360 days    HDL  Date Value Ref Range Status  10/19/2020 67 >39 mg/dL Final         Passed - Triglycerides in normal range and within 360 days    Triglycerides  Date Value Ref Range Status  10/19/2020 66 0 - 149 mg/dL Final         Passed - Patient is not pregnant      Passed - Valid encounter within last 12 months    Recent Outpatient Visits          4 months ago Annual physical exam   [place], Clearnce Sorrel, [place]   1 year ago Flank pain   [place], Clearnce Sorrel, [place]   1 year ago Annual physical exam   [place], Vickki Muff, PA-C   3 years ago [place], Clearnce Sorrel, [place]   3 years ago Annual  physical exam   [place], [place]

## 2021-03-23 DIAGNOSIS — R921 Mammographic calcification found on diagnostic imaging of breast: Secondary | ICD-10-CM | POA: Diagnosis not present

## 2021-03-23 DIAGNOSIS — Z1231 Encounter for screening mammogram for malignant neoplasm of breast: Secondary | ICD-10-CM | POA: Diagnosis not present

## 2021-04-13 ENCOUNTER — Telehealth: Payer: Self-pay

## 2021-04-13 NOTE — Telephone Encounter (Signed)
Dr. Neomia Dear is to see pt. On 04/14/21 at 10:20 a.m. patient notified

## 2021-04-13 NOTE — Telephone Encounter (Signed)
Copied from [place] [id number]. Topic: Appointment Scheduling - Scheduling Inquiry for Clinic >> Apr 13, 2021 11:22 AM Greggory Keen D wrote: Reason for CRM: Pt has ana ppt on July 12 for Simona Huh to look at a place on her back.  She would like to be seen sooner if possible.  CB# [phone]

## 2021-04-14 ENCOUNTER — Ambulatory Visit (INDEPENDENT_AMBULATORY_CARE_PROVIDER_SITE_OTHER): Payer: Medicare Other | Admitting: Internal Medicine

## 2021-04-14 ENCOUNTER — Encounter: Payer: Self-pay | Admitting: Internal Medicine

## 2021-04-14 ENCOUNTER — Other Ambulatory Visit: Payer: Self-pay

## 2021-04-14 VITALS — BP 122/70 | HR 73 | Temp 98.6°F | Ht 63.98 in | Wt 173.6 lb

## 2021-04-14 DIAGNOSIS — M79672 Pain in left foot: Secondary | ICD-10-CM | POA: Diagnosis not present

## 2021-04-14 DIAGNOSIS — L989 Disorder of the skin and subcutaneous tissue, unspecified: Secondary | ICD-10-CM

## 2021-04-14 MED ORDER — NAPROXEN 250 MG PO TABS: 250.0000 mg | ORAL_TABLET | Freq: Two times a day (BID) | ORAL | 0 refills | Status: DC

## 2021-04-14 NOTE — Progress Notes (Signed)
BP (!) 168/93   Pulse 73   Temp 98.6 F (37 C) (Oral)   Ht 5' 3.98" (1.625 m)   Wt 173 lb 9.6 oz (78.7 kg)   SpO2 97%   BMI 29.82 kg/m    Subjective:    Patient ID: Monica Delacruz, female    DOB: 29-Jan-1951, 70 y.o.   MRN: [id number]  Chief Complaint  Patient presents with   Spot on back    Has a spot on right upper back under bra strap has been there for a little while, but has recently gotten bigger.    Foot Pain    Left heel pain for the past 3 months, Patient believes that she has plantar fasciitis. Has been getting worse for the past 2 to 3 weeks. Patient has insert in shoe that seems to be help.    HPI: Monica Delacruz is a 70 y.o. female  Has a skin lesion on her back around the bra strap and this was raised as she could feel it.   Foot Pain This is a new problem. The current episode started more than 1 month ago (x since march went to [place] with her sisters). Associated symptoms include a rash. Pertinent negatives include no anorexia, congestion, fatigue or sore throat.  Rash This is a new problem. Pertinent negatives include no anorexia, congestion, facial edema, fatigue, joint pain or sore throat.   Chief Complaint  Patient presents with   Spot on back    Has a spot on right upper back under bra strap has been there for a little while, but has recently gotten bigger.    Foot Pain    Left heel pain for the past 3 months, Patient believes that she has plantar fasciitis. Has been getting worse for the past 2 to 3 weeks. Patient has insert in shoe that seems to be help.    Relevant past medical, surgical, family and social history reviewed and updated as indicated. Interim medical history since our last visit reviewed. Allergies and medications reviewed and updated.  Review of Systems  Constitutional:  Negative for fatigue.  HENT:  Negative for congestion and sore throat.   Gastrointestinal:  Negative for anorexia.  Musculoskeletal:  Negative for joint pain.   Skin:  Positive for rash.   Per HPI unless specifically indicated above     Objective:    BP (!) 168/93   Pulse 73   Temp 98.6 F (37 C) (Oral)   Ht 5' 3.98" (1.625 m)   Wt 173 lb 9.6 oz (78.7 kg)   SpO2 97%   BMI 29.82 kg/m   Wt Readings from Last 3 Encounters:  04/14/21 173 lb 9.6 oz (78.7 kg)  10/14/20 173 lb (78.5 kg)  03/04/20 162 lb 14.7 oz (73.9 kg)    Physical Exam Constitutional:      Appearance: Normal appearance. She is normal weight.  Neurological:     Mental Status: She is alert.   Results for orders placed or performed in visit on 10/14/20  CBC w/Diff/Platelet  Result Value Ref Range   WBC 5.6 3.4 - 10.8 x10E3/uL   RBC 4.50 3.77 - 5.28 x10E6/uL   Hemoglobin 13.5 11.1 - 15.9 g/dL   Hematocrit 41.9 34.0 - 46.6 %   MCV 93 79 - 97 fL   MCH 30.0 26.6 - 33.0 pg   MCHC 32.2 31.5 - 35.7 g/dL   RDW 12.7 11.7 - 15.4 %   Platelets 254 150 - 450  x10E3/uL   Neutrophils 45 Not Estab. %   Lymphs 44 Not Estab. %   Monocytes 9 Not Estab. %   Eos 1 Not Estab. %   Basos 1 Not Estab. %   Neutrophils Absolute 2.5 1.4 - 7.0 x10E3/uL   Lymphocytes Absolute 2.4 0.7 - 3.1 x10E3/uL   Monocytes Absolute 0.5 0.1 - 0.9 x10E3/uL   EOS (ABSOLUTE) 0.1 0.0 - 0.4 x10E3/uL   Basophils Absolute 0.1 0.0 - 0.2 x10E3/uL   Immature Granulocytes 0 Not Estab. %   Immature Grans (Abs) 0.0 0.0 - 0.1 x10E3/uL  Comprehensive Metabolic Panel (CMET)  Result Value Ref Range   Glucose 93 65 - 99 mg/dL   BUN 15 8 - 27 mg/dL   Creatinine, Ser 0.79 0.57 - 1.00 mg/dL   GFR calc non Af Amer 77 >59 mL/min/1.73   GFR calc Af Amer 89 >59 mL/min/1.73   BUN/Creatinine Ratio 19 12 - 28   Sodium 143 134 - 144 mmol/L   Potassium 4.3 3.5 - 5.2 mmol/L   Chloride 108 (H) 96 - 106 mmol/L   CO2 20 20 - 29 mmol/L   Calcium 9.0 8.7 - 10.3 mg/dL   Total Protein 6.5 6.0 - 8.5 g/dL   Albumin 4.2 3.8 - 4.8 g/dL   Globulin, Total 2.3 1.5 - 4.5 g/dL   Albumin/Globulin Ratio 1.8 1.2 - 2.2   Bilirubin Total  0.3 0.0 - 1.2 mg/dL   Alkaline Phosphatase 78 44 - 121 IU/L   AST 18 0 - 40 IU/L   ALT 19 0 - 32 IU/L  Lipid Panel With LDL/HDL Ratio  Result Value Ref Range   Cholesterol, Total 192 100 - 199 mg/dL   Triglycerides 66 0 - 149 mg/dL   HDL 67 >39 mg/dL   VLDL Cholesterol Cal 12 5 - 40 mg/dL   LDL Chol Calc (NIH) 113 (H) 0 - 99 mg/dL   LDL/HDL Ratio 1.7 0.0 - 3.2 ratio  Vitamin D (25 hydroxy)  Result Value Ref Range   Vit D, 25-Hydroxy 59.7 30.0 - 100.0 ng/mL        Current Outpatient Medications:    butalbital-acetaminophen-caffeine (FIORICET) 50-325-40 MG tablet, Take 1 tablet by mouth every 6 (six) hours as needed for migraine., Disp: , Rfl:    cetirizine (ZYRTEC) 10 MG tablet, Take 10 mg by mouth daily., Disp: , Rfl:    fluticasone (FLONASE) 50 MCG/ACT nasal spray, Place 1 spray into both nostrils daily as needed for allergies or rhinitis., Disp: , Rfl:    ibuprofen (ADVIL) 200 MG tablet, Take 600 mg by mouth every 6 (six) hours as needed for moderate pain., Disp: , Rfl:    lovastatin (MEVACOR) 40 MG tablet, TAKE 1 TABLET(40 MG) BY MOUTH DAILY, Disp: 90 tablet, Rfl: 2   MEGARED OMEGA-3 KRILL OIL PO, Take 1 tablet by mouth daily. , Disp: , Rfl:    VITAMIN D PO, Take 3 tablets by mouth daily. 600 units per, Disp: , Rfl:    Zinc Sulfate (ZINC-220 PO), Take by mouth daily., Disp: , Rfl:     Assessment & Plan:  Right foot pain :  will check xrays  Start pt on naproxen   2. Skin changes Will refer to derm.   Problem List Items Addressed This Visit   None    Orders Placed This Encounter  Procedures   DG Foot Complete Left   Ambulatory referral to Dermatology     Meds ordered this encounter  Medications  naproxen (NAPROSYN) 250 MG tablet    Sig: Take 1 tablet (250 mg total) by mouth 2 (two) times daily with a meal.    Dispense:  20 tablet    Refill:  0     Follow up plan: No follow-ups on file.

## 2021-04-16 ENCOUNTER — Telehealth: Payer: Self-pay

## 2021-04-16 NOTE — Telephone Encounter (Signed)
Copied from [place] [id number]. Topic: Referral - Status >> Apr 16, 2021  8:19 AM Monica Delacruz wrote: Reason for CRM: Pt received a referral for dermatology from Dr. Neomia Dear pt called to find out status and if it was placed and wanted to make sure it is a place that excepts her insurance/ please advise with location

## 2021-04-19 ENCOUNTER — Other Ambulatory Visit: Payer: Self-pay

## 2021-04-19 DIAGNOSIS — L989 Disorder of the skin and subcutaneous tissue, unspecified: Secondary | ICD-10-CM

## 2021-04-21 ENCOUNTER — Other Ambulatory Visit: Payer: Self-pay | Admitting: Internal Medicine

## 2021-04-21 NOTE — Telephone Encounter (Signed)
FYI this is a [facility] Patient Dr.Vigg prescribed the Naproxen

## 2021-04-21 NOTE — Telephone Encounter (Signed)
   Notes to clinic: review for refill Short supply given    Requested Prescriptions  Pending Prescriptions Disp Refills   naproxen (NAPROSYN) 250 MG tablet [Pharmacy Med Name: NAPROXEN 250MG  TABLETS] 20 tablet 0    Sig: TAKE 1 TABLET(250 MG) BY MOUTH TWICE DAILY WITH A MEAL      Analgesics:  NSAIDS Passed - 04/21/2021  3:11 AM      Passed - Cr in normal range and within 360 days    Creat  Date Value Ref Range Status  10/20/2017 0.87 0.50 - 0.99 mg/dL Final    Comment:    For patients >88 years of age, the reference limit for Creatinine is approximately 13% higher for people identified as African-American. .    Creatinine, Ser  Date Value Ref Range Status  10/19/2020 0.79 0.57 - 1.00 mg/dL Final          Passed - HGB in normal range and within 360 days    Hemoglobin  Date Value Ref Range Status  10/19/2020 13.5 11.1 - 15.9 g/dL Final          Passed - Patient is not pregnant      Passed - Valid encounter within last 12 months    Recent Outpatient Visits           1 week ago Skin lesion of back   [place], [place]   6 months ago Annual physical exam   [place], Clearnce Sorrel, [place]   1 year ago Flank pain   [place], Clearnce Sorrel, [place]   1 year ago Annual physical exam   [employer], Vickki Muff, PA-C   3 years ago [place], Clearnce Sorrel, [place]       Future Appointments             In 1 week Brendolyn Patty, MD [place]

## 2021-04-23 DIAGNOSIS — M7752 Other enthesopathy of left foot: Secondary | ICD-10-CM | POA: Diagnosis not present

## 2021-04-23 DIAGNOSIS — M79672 Pain in left foot: Secondary | ICD-10-CM | POA: Diagnosis not present

## 2021-04-23 DIAGNOSIS — M722 Plantar fascial fibromatosis: Secondary | ICD-10-CM | POA: Diagnosis not present

## 2021-05-04 ENCOUNTER — Ambulatory Visit: Payer: Medicare Other | Admitting: Family Medicine

## 2021-05-04 ENCOUNTER — Other Ambulatory Visit: Payer: Self-pay

## 2021-05-04 ENCOUNTER — Ambulatory Visit: Payer: Medicare Other | Admitting: Dermatology

## 2021-05-04 DIAGNOSIS — D229 Melanocytic nevi, unspecified: Secondary | ICD-10-CM | POA: Diagnosis not present

## 2021-05-04 DIAGNOSIS — D1800 Hemangioma unspecified site: Secondary | ICD-10-CM

## 2021-05-04 DIAGNOSIS — L821 Other seborrheic keratosis: Secondary | ICD-10-CM | POA: Diagnosis not present

## 2021-05-04 DIAGNOSIS — L820 Inflamed seborrheic keratosis: Secondary | ICD-10-CM | POA: Diagnosis not present

## 2021-05-04 DIAGNOSIS — L814 Other melanin hyperpigmentation: Secondary | ICD-10-CM

## 2021-05-04 NOTE — Progress Notes (Signed)
   New Patient Visit  Subjective  Monica Delacruz is a 70 y.o. female who presents for the following: Skin Problem (Patient here today for a spot at back. Present > 1 year and started out just a brown spot. It became sore, itchy and raised but has since improved and peeled. ).  No personal hx of skin cancer.   The following portions of the chart were reviewed this encounter and updated as appropriate:        Review of Systems:  No other skin or systemic complaints except as noted in HPI or Assessment and Plan.  Objective  Well appearing patient in no apparent distress; mood and affect are within normal limits.  A focused examination was performed including back, arms, face. Relevant physical exam findings are noted in the Assessment and Plan.  Right Mid Back Erythematous keratotic or waxy stuck-on papule    Assessment & Plan  Inflamed seborrheic keratosis Right Mid Back  Destruction of lesion - Right Mid Back  Destruction method: cryotherapy   Informed consent: discussed and consent obtained   Lesion destroyed using liquid nitrogen: Yes   Region frozen until ice ball extended beyond lesion: Yes   Outcome: patient tolerated procedure well with no complications   Post-procedure details: wound care instructions given   Additional details:  Prior to procedure, discussed risks of blister formation, small wound, skin dyspigmentation, or rare scar following cryotherapy. Recommend Vaseline ointment to treated areas while healing.   Lentigines - Scattered tan macules - Due to sun exposure - Benign-appering, observe - Recommend daily broad spectrum sunscreen SPF 30+ to sun-exposed areas, reapply every 2 hours as needed. - Call for any changes  Hemangiomas - Red papules - Discussed benign nature - Observe - Call for any changes  Seborrheic Keratoses - Stuck-on, waxy, tan-brown papules and/or plaques  - Benign-appearing - Discussed benign etiology and prognosis. -  Observe - Call for any changes  Melanocytic Nevi - Tan-brown and/or pink-flesh-colored symmetric macules and papules - Benign appearing on exam today - Observation - Call clinic for new or changing moles - Recommend daily use of broad spectrum spf 30+ sunscreen to sun-exposed areas.   Return if symptoms worsen or fail to improve.  Graciella Belton, RMA, am acting as scribe for Brendolyn Patty, MD .  Documentation: I have reviewed the above documentation for accuracy and completeness, and I agree with the above.  Brendolyn Patty MD

## 2021-05-04 NOTE — Patient Instructions (Addendum)
Seborrheic Keratosis  What causes seborrheic keratoses? Seborrheic keratoses are harmless, common skin growths that first appear during adult life.  As time goes by, more growths appear.  Some people may develop a large number of them.  Seborrheic keratoses appear on both covered and uncovered body parts.  They are not caused by sunlight.  The tendency to develop seborrheic keratoses can be inherited.  They vary in color from skin-colored to gray, brown, or even black.  They can be either smooth or have a rough, warty surface.   Seborrheic keratoses are superficial and look as if they were stuck on the skin.  Under the microscope this type of keratosis looks like layers upon layers of skin.  That is why at times the top layer may seem to fall off, but the rest of the growth remains and re-grows.    Treatment Seborrheic keratoses do not need to be treated, but can easily be removed in the office.  Seborrheic keratoses often cause symptoms when they rub on clothing or jewelry.  Lesions can be in the way of shaving.  If they become inflamed, they can cause itching, soreness, or burning.  Removal of a seborrheic keratosis can be accomplished by freezing, burning, or surgery. If any spot bleeds, scabs, or grows rapidly, please return to have it checked, as these can be an indication of a skin cancer.  Cryotherapy Aftercare  Wash gently with soap and water everyday.   Apply Vaseline and Band-Aid daily until healed.    Melanoma ABCDEs  Melanoma is the most dangerous type of skin cancer, and is the leading cause of death from skin disease.  You are more likely to develop melanoma if you: Have light-colored skin, light-colored eyes, or red or blond hair Spend a lot of time in the sun Tan regularly, either outdoors or in a tanning bed Have had blistering sunburns, especially during childhood Have a close family member who has had a melanoma Have atypical moles or large birthmarks  Early detection of  melanoma is key since treatment is typically straightforward and cure rates are extremely high if we catch it early.   The first sign of melanoma is often a change in a mole or a new dark spot.  The ABCDE system is a way of remembering the signs of melanoma.  A for asymmetry:  The two halves do not match. B for border:  The edges of the growth are irregular. C for color:  A mixture of colors are present instead of an even brown color. D for diameter:  Melanomas are usually (but not always) greater than 29mm - the size of a pencil eraser. E for evolution:  The spot keeps changing in size, shape, and color.  Please check your skin once per month between visits. You can use a small mirror in front and a large mirror behind you to keep an eye on the back side or your body.   If you see any new or changing lesions before your next follow-up, please call to schedule a visit.  Recommend daily broad spectrum sunscreen SPF 30+ to sun-exposed areas, reapply every 2 hours as needed. Call for new or changing lesions.  Staying in the shade or wearing long sleeves, sun glasses (UVA+UVB protection) and wide brim hats (4-inch brim around the entire circumference of the hat) are also recommended for sun protection.   If you have any questions or concerns for your doctor, please call our main line at [phone] and press  option 4 to reach your doctor's medical assistant. If no one answers, please leave a voicemail as directed and we will return your call as soon as possible. Messages left after 4 pm will be answered the following business day.   You may also send [place] a message via [place]. We typically respond to MyChart messages within 1-2 business days.  For prescription refills, please ask your pharmacy to contact our office. Our fax number is [phone].  If you have an urgent issue when the clinic is closed that cannot wait until the next business day, you can page your doctor at the number below.     Please note that while we do our best to be available for urgent issues outside of office hours, we are not available 24/7.   If you have an urgent issue and are unable to reach [place], you may choose to seek medical care at your doctor's office, retail clinic, urgent care center, or emergency room.  If you have a medical emergency, please immediately call 911 or go to the emergency department.  Pager Numbers  - Dr. Nehemiah Massed: [phone]  - Dr. Laurence Ferrari: [phone]  - Dr. Nicole Kindred: [phone]  In the event of inclement weather, please call our main line at [phone] for an update on the status of any delays or closures.  Dermatology Medication Tips: Please keep the boxes that topical medications come in in order to help keep track of the instructions about where and how to use these. Pharmacies typically print the medication instructions only on the boxes and not directly on the medication tubes.   If your medication is too expensive, please contact our office at [phone] option 4 or send [place] a message through [place].   We are unable to tell what your co-pay for medications will be in advance as this is different depending on your insurance coverage. However, we may be able to find a substitute medication at lower cost or fill out paperwork to get insurance to cover a needed medication.   If a prior authorization is required to get your medication covered by your insurance company, please allow [place] 1-2 business days to complete this process.  Drug prices often vary depending on where the prescription is filled and some pharmacies may offer cheaper prices.  The website www.goodrx.com contains coupons for medications through different pharmacies. The prices here do not account for what the cost may be with help from insurance (it may be cheaper with your insurance), but the website can give you the price if you did not use any insurance.  - You can print the associated coupon and take  it with your prescription to the pharmacy.  - You may also stop by our office during regular business hours and pick up a GoodRx coupon card.  - If you need your prescription sent electronically to a different pharmacy, notify our office through [facility] or by phone at [phone] option 4.

## 2021-05-19 DIAGNOSIS — M7752 Other enthesopathy of left foot: Secondary | ICD-10-CM | POA: Diagnosis not present

## 2021-05-19 DIAGNOSIS — M722 Plantar fascial fibromatosis: Secondary | ICD-10-CM | POA: Diagnosis not present

## 2021-06-14 IMAGING — MG MM BREAST BX W LOC DEV 1ST LESION IMAGE BX SPEC STEREO GUIDE*L*
8 of 10 series · 8 of 22 positions shown · non-contrast
Comparison: Previous exams.
COMPARISON: Previous exams.

Addendum:
CLINICAL DATA: Left breast calcifications.

EXAM:
LEFT BREAST STEREOTACTIC CORE NEEDLE BIOPSY

[L (1 of 6)]
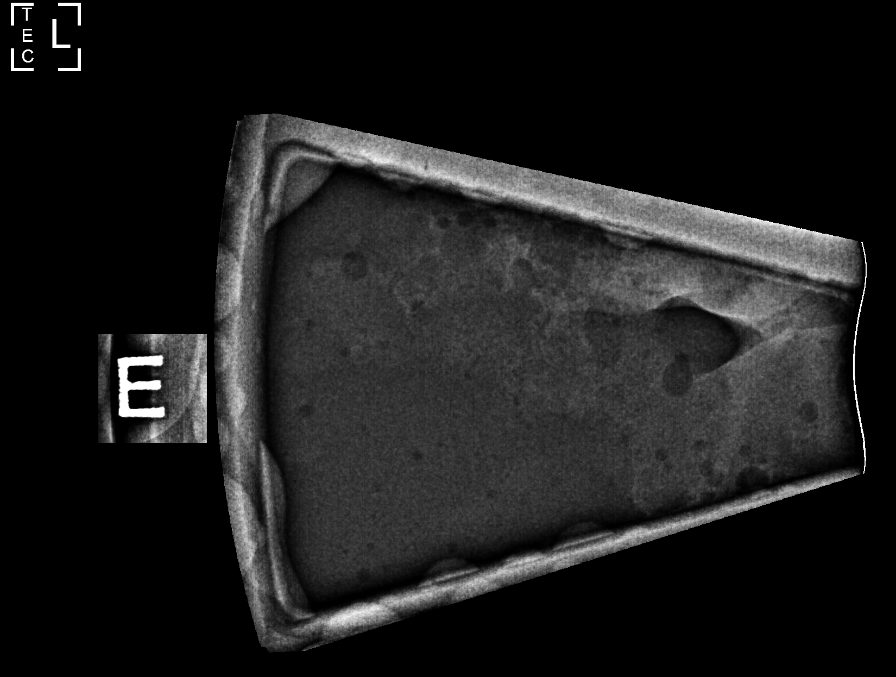

[L (2 of 6)]
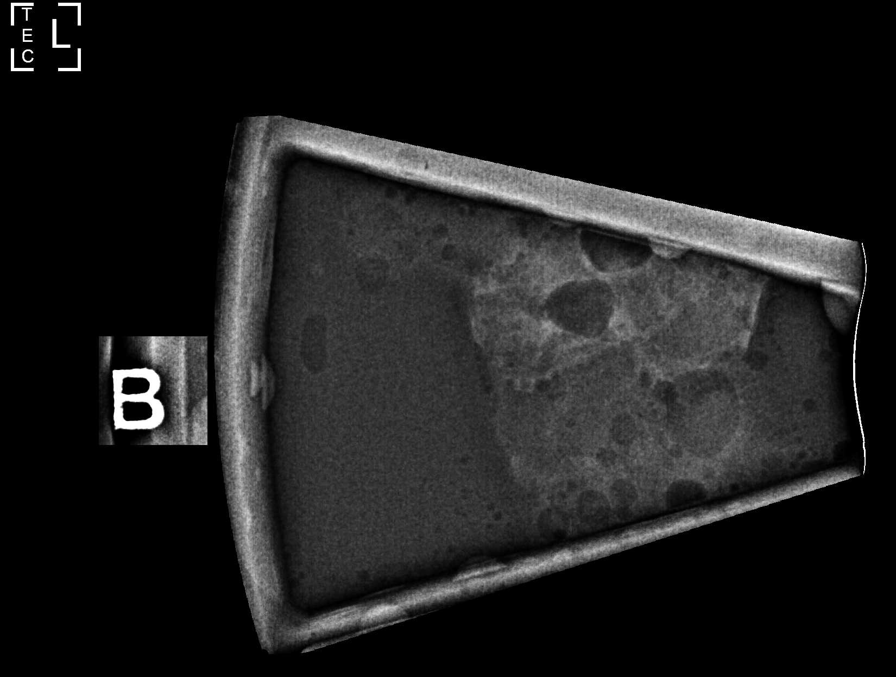

[L (3 of 6)]
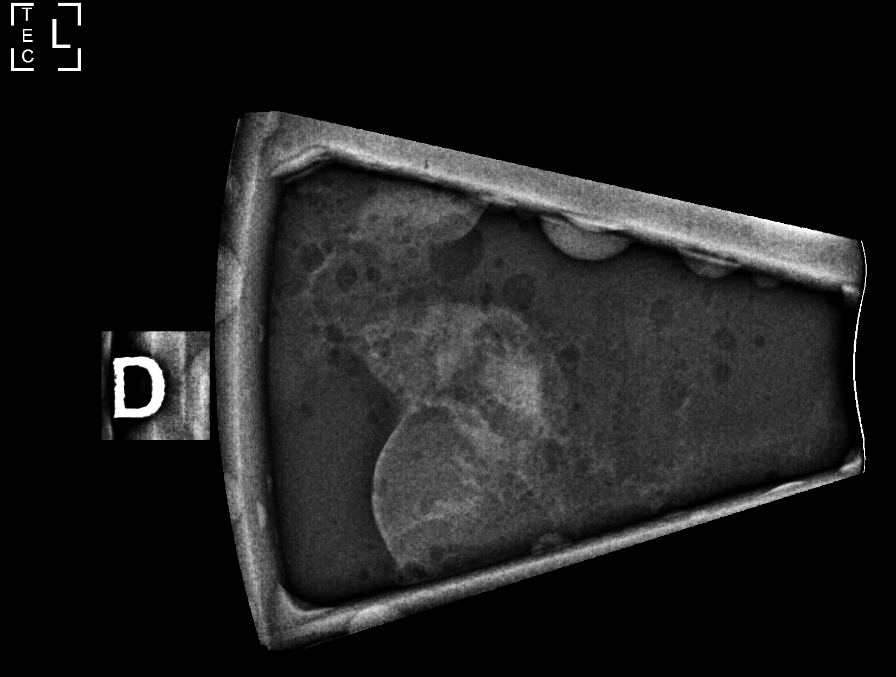

[L (4 of 6)]
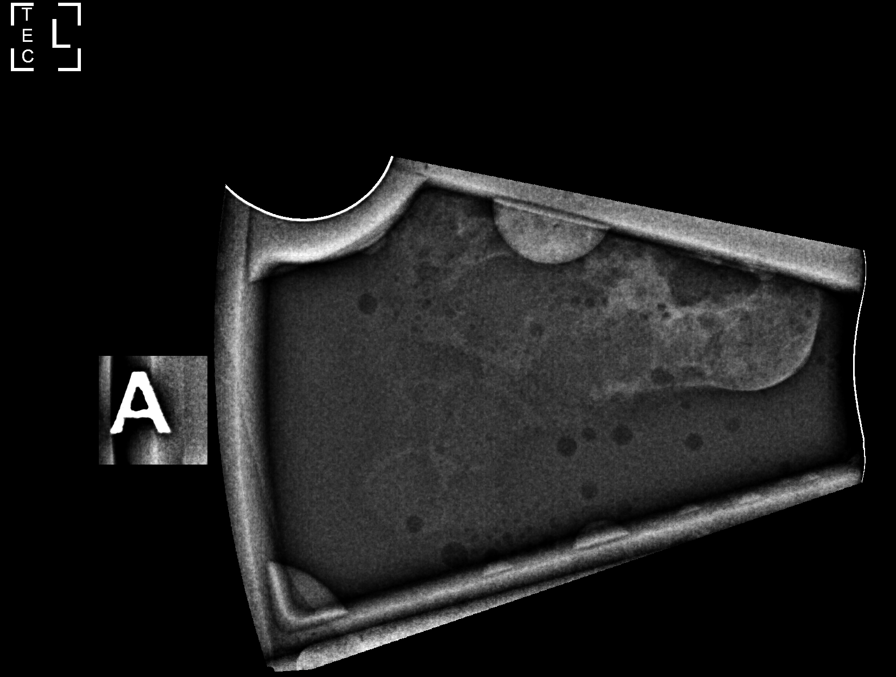

[L (5 of 6)]
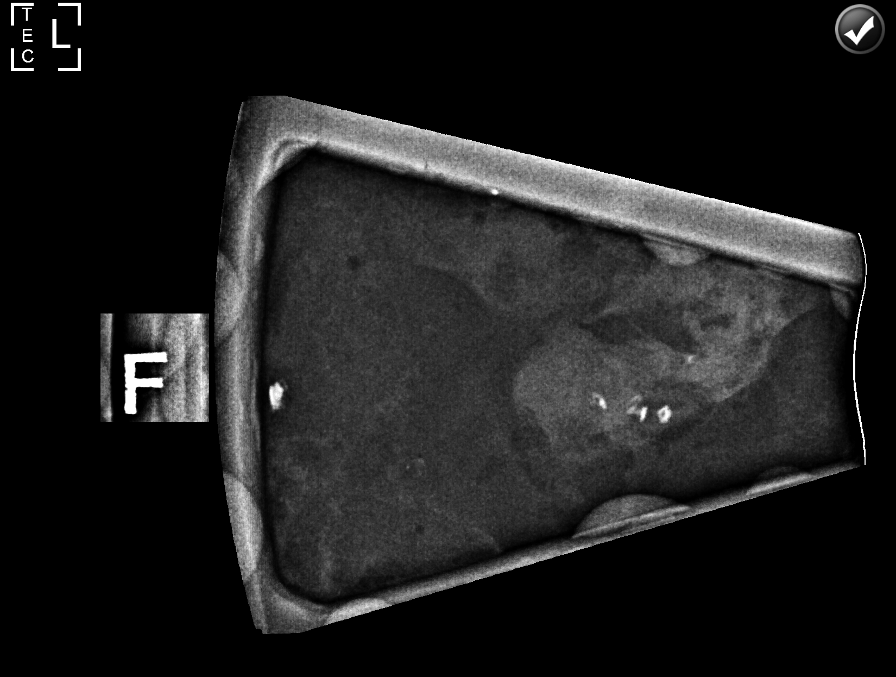

[L (6 of 6)]
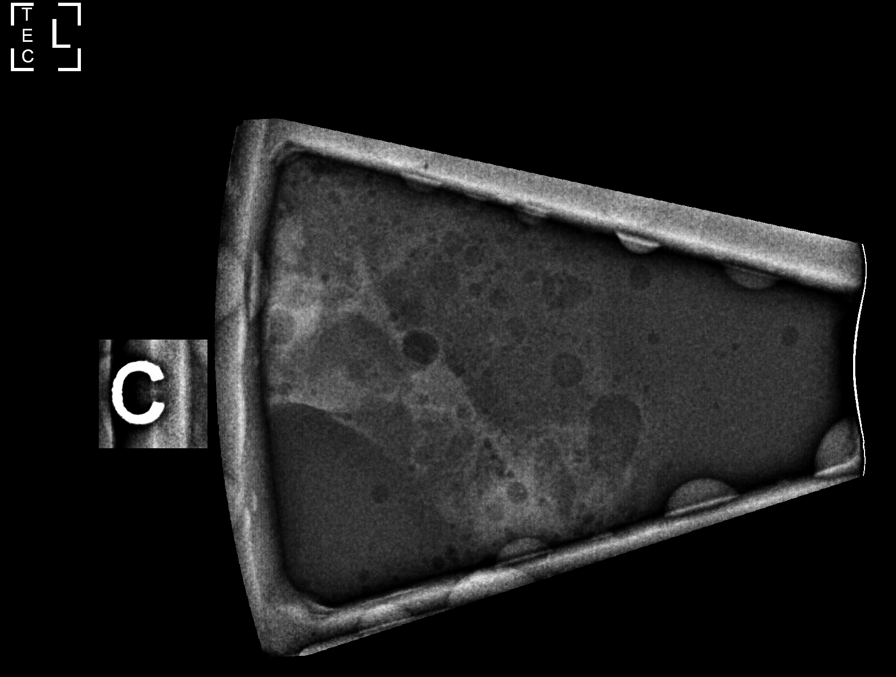

[L CC]
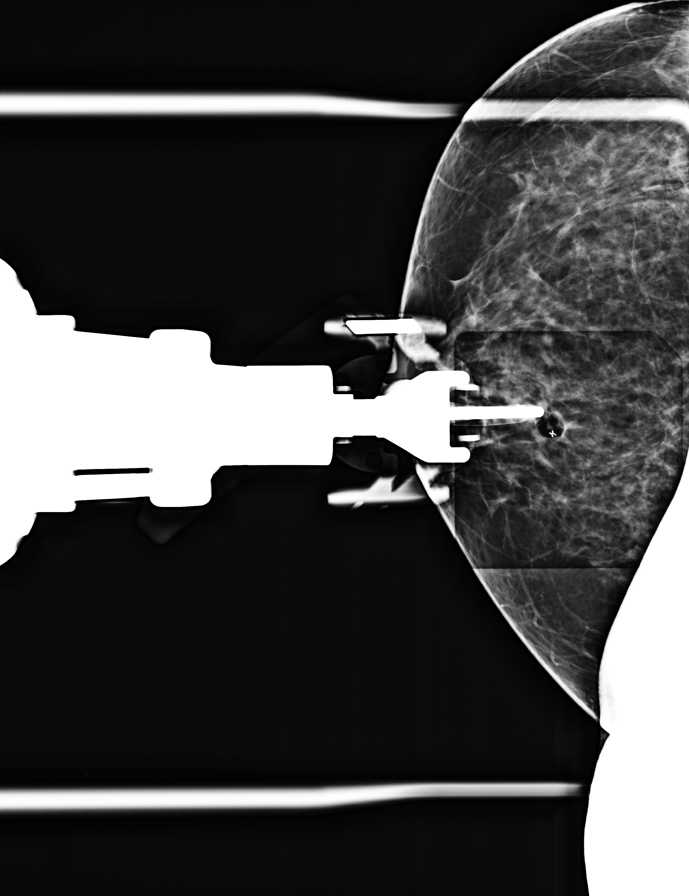

[L CC tomo · tomo slice 34/67.0]
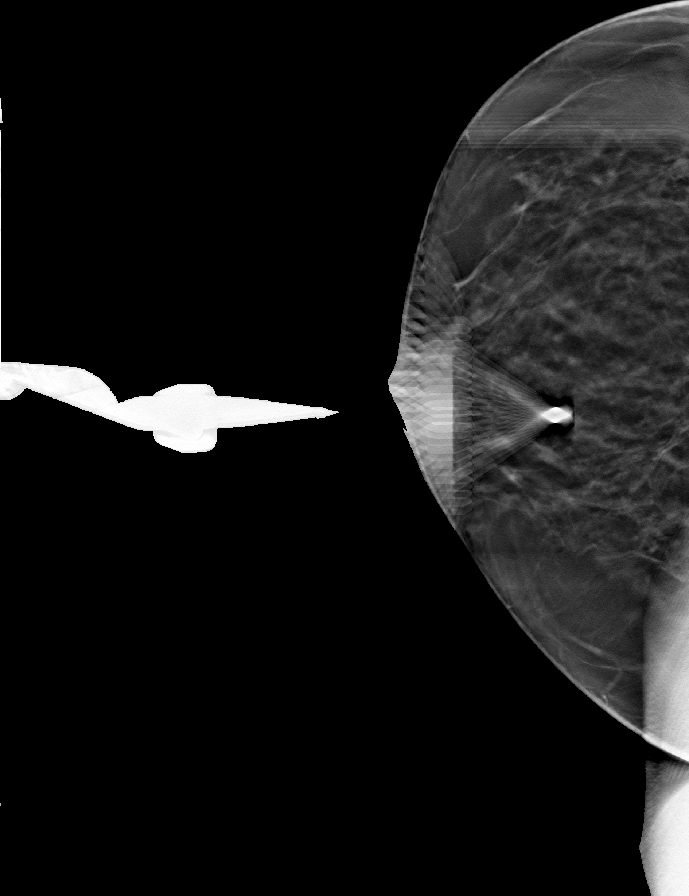

[8 of 22 positions shown; findings below may reference images not displayed]



Using sterile technique and 1% lidocaine and 1% lidocaine with
epinephrine as local anesthetic, under stereotactic guidance, a 9
gauge vacuum assisted device was used to perform core needle biopsy
of calcifications in the upper-outer quadrant of the left breast
using a superior to inferior approach. Specimen radiograph was
performed showing calcifications are present in the tissue samples.
Specimens with calcifications are identified for pathology.

Lesion quadrant: Upper-outer quadrant

At the conclusion of the procedure, X shaped tissue marker clip was
deployed into the biopsy cavity. Follow-up 2-view mammogram was
performed and dictated separately.
IMPRESSION: Stereotactic-guided biopsy of the left breast. No apparent
complications.

ADDENDUM:
PATHOLOGY revealed: A. BREAST CALCIFICATION, LEFT UPPER OUTER
QUADRANT; STEREOTACTIC BIOPSY: - MUCOCELE-LIKE LESION WITH
ASSOCIATED CALCIFICATION, SEE COMMENT. - DEEPER SECTIONS EXAMINED.

Comment: Mucocele-like lesions are areas of mucin extrusion into
breast tissue and are usually associated with a ruptured cyst or
dilated duct with mucinous luminal contents. Partial sampling of a
mucinous invasive carcinoma could have a similar appearance.
Correlation with radiographic

imaging and clinical impression is required.

Pathology results are CONCORDANT with imaging findings, per Dr. Samsonaite
Jim.

Pathology results and recommendations below were discussed with
patient by telephone on 02/14/2020. Patient reported biopsy site
doing well with slight tenderness at the site. Post biopsy care
instructions were reviewed and questions were answered. Patient was
instructed to call [HOSPITAL] if any concerns or
questions arise related to the biopsy.

Recommendation: Surgical referral. Request for surgical referral was
relayed to Pupun Mike RT at [HOSPITAL] by Ceejay
Rufina RN on 02/14/2020.

Addendum by Bledar Esmeralda Lovve RN on 02/17/2020.



Using sterile technique and 1% lidocaine and 1% lidocaine with
epinephrine as local anesthetic, under stereotactic guidance, a 9
gauge vacuum assisted device was used to perform core needle biopsy
of calcifications in the upper-outer quadrant of the left breast
using a superior to inferior approach. Specimen radiograph was
performed showing calcifications are present in the tissue samples.
Specimens with calcifications are identified for pathology.

Lesion quadrant: Upper-outer quadrant

At the conclusion of the procedure, X shaped tissue marker clip was
deployed into the biopsy cavity. Follow-up 2-view mammogram was
performed and dictated separately.
IMPRESSION: Stereotactic-guided biopsy of the left breast. No apparent
complications.

## 2021-06-24 DIAGNOSIS — D1030 Benign neoplasm of unspecified part of mouth: Secondary | ICD-10-CM | POA: Diagnosis not present

## 2021-07-01 ENCOUNTER — Encounter: Payer: Self-pay | Admitting: Otolaryngology

## 2021-07-01 ENCOUNTER — Other Ambulatory Visit: Payer: Self-pay

## 2021-07-07 DIAGNOSIS — M722 Plantar fascial fibromatosis: Secondary | ICD-10-CM | POA: Diagnosis not present

## 2021-07-15 ENCOUNTER — Ambulatory Visit
Admission: RE | Admit: 2021-07-15 | Discharge: 2021-07-15 | Disposition: A | Discharge status: Home / Self Care | Payer: Medicare Other | Source: Ambulatory Visit | Attending: Otolaryngology | Admitting: Otolaryngology

## 2021-07-15 ENCOUNTER — Encounter
Admission: RE | Disposition: A | Discharge status: Home / Self Care | Payer: Self-pay | Source: Ambulatory Visit | Attending: Otolaryngology

## 2021-07-15 ENCOUNTER — Encounter: Payer: Self-pay | Admitting: Otolaryngology

## 2021-07-15 ENCOUNTER — Ambulatory Visit: Payer: Medicare Other | Admitting: Anesthesiology

## 2021-07-15 ENCOUNTER — Other Ambulatory Visit: Payer: Self-pay

## 2021-07-15 DIAGNOSIS — Z885 Allergy status to narcotic agent status: Secondary | ICD-10-CM | POA: Diagnosis not present

## 2021-07-15 DIAGNOSIS — Z882 Allergy status to sulfonamides status: Secondary | ICD-10-CM | POA: Diagnosis not present

## 2021-07-15 DIAGNOSIS — Z91040 Latex allergy status: Secondary | ICD-10-CM | POA: Diagnosis not present

## 2021-07-15 DIAGNOSIS — Z886 Allergy status to analgesic agent status: Secondary | ICD-10-CM | POA: Insufficient documentation

## 2021-07-15 DIAGNOSIS — Z79899 Other long term (current) drug therapy: Secondary | ICD-10-CM | POA: Insufficient documentation

## 2021-07-15 DIAGNOSIS — D1809 Hemangioma of other sites: Secondary | ICD-10-CM | POA: Insufficient documentation

## 2021-07-15 DIAGNOSIS — D100 Benign neoplasm of lip: Secondary | ICD-10-CM | POA: Diagnosis not present

## 2021-07-15 DIAGNOSIS — K1379 Other lesions of oral mucosa: Secondary | ICD-10-CM | POA: Diagnosis not present

## 2021-07-15 DIAGNOSIS — D1801 Hemangioma of skin and subcutaneous tissue: Secondary | ICD-10-CM | POA: Diagnosis not present

## 2021-07-15 DIAGNOSIS — K130 Diseases of lips: Secondary | ICD-10-CM | POA: Diagnosis present

## 2021-07-15 HISTORY — PX: EXCISION OF TONGUE LESION: SHX6434

## 2021-07-15 SURGERY — EXCISION, LESION, TONGUE
Anesthesia: General | Site: Mouth | Laterality: Left

## 2021-07-15 MED ORDER — LACTATED RINGERS IV SOLN: INTRAVENOUS | Status: DC

## 2021-07-15 MED ORDER — DEXAMETHASONE SODIUM PHOSPHATE 4 MG/ML IJ SOLN
INTRAMUSCULAR | Status: DC | PRN
  Administered 2021-07-15: 4 mg via INTRAVENOUS

## 2021-07-15 MED ORDER — LIDOCAINE-EPINEPHRINE 1 %-1:100000 IJ SOLN
INTRAMUSCULAR | Status: DC | PRN
  Administered 2021-07-15: 2 mL

## 2021-07-15 MED ORDER — FENTANYL CITRATE (PF) 100 MCG/2ML IJ SOLN
INTRAMUSCULAR | Status: DC | PRN
  Administered 2021-07-15: 25 ug via INTRAVENOUS
  Administered 2021-07-15: 50 ug via INTRAVENOUS
  Administered 2021-07-15: 25 ug via INTRAVENOUS

## 2021-07-15 MED ORDER — ACETAMINOPHEN 10 MG/ML IV SOLN
INTRAVENOUS | Status: DC | PRN
  Administered 2021-07-15: 1000 mg via INTRAVENOUS

## 2021-07-15 MED ORDER — DEXMEDETOMIDINE (PRECEDEX) IN NS 20 MCG/5ML (4 MCG/ML) IV SYRINGE
PREFILLED_SYRINGE | INTRAVENOUS | Status: DC | PRN
  Administered 2021-07-15: 10 ug via INTRAVENOUS

## 2021-07-15 MED ORDER — ONDANSETRON HCL 4 MG/2ML IJ SOLN
INTRAMUSCULAR | Status: DC | PRN
Start: 2021-07-15 — End: 2021-07-15
  Administered 2021-07-15: 4 mg via INTRAVENOUS

## 2021-07-15 MED ORDER — HYDROCODONE-ACETAMINOPHEN 5-325 MG PO TABS: 1.0000 | ORAL_TABLET | Freq: Four times a day (QID) | ORAL | 0 refills | Status: AC | PRN

## 2021-07-15 MED ORDER — BACITRACIN 500 UNIT/GM EX OINT
TOPICAL_OINTMENT | CUTANEOUS | Status: DC | PRN
  Administered 2021-07-15: 1 via TOPICAL

## 2021-07-15 MED ORDER — MIDAZOLAM HCL 5 MG/5ML IJ SOLN
INTRAMUSCULAR | Status: DC | PRN
  Administered 2021-07-15: 2 mg via INTRAVENOUS

## 2021-07-15 MED ORDER — PROPOFOL 10 MG/ML IV BOLUS
INTRAVENOUS | Status: DC | PRN
  Administered 2021-07-15: 50 mg via INTRAVENOUS
  Administered 2021-07-15: 100 mg via INTRAVENOUS

## 2021-07-15 MED ORDER — SUCCINYLCHOLINE CHLORIDE 200 MG/10ML IV SOSY
PREFILLED_SYRINGE | INTRAVENOUS | Status: DC | PRN
  Administered 2021-07-15: 80 mg via INTRAVENOUS

## 2021-07-15 MED ORDER — LIDOCAINE HCL (CARDIAC) PF 100 MG/5ML IV SOSY
PREFILLED_SYRINGE | INTRAVENOUS | Status: DC | PRN
  Administered 2021-07-15: 30 mg via INTRAVENOUS

## 2021-07-15 SURGICAL SUPPLY — 19 items
CORD BIP STRL DISP 12FT (MISCELLANEOUS) ×2 IMPLANT
COVER MAYO STAND STRL (DRAPES) ×2 IMPLANT
COVER TABLE BACK 60X90 (DRAPES) ×2 IMPLANT
CUP MEDICINE 2OZ PLAST GRAD ST (MISCELLANEOUS) ×2 IMPLANT
ELECT REM PT RETURN 9FT ADLT (ELECTROSURGICAL) ×2
ELECTRODE REM PT RTRN 9FT ADLT (ELECTROSURGICAL) ×1 IMPLANT
GLOVE SURG GAMMEX PI TX LF 7.5 (GLOVE) ×2 IMPLANT
KIT TURNOVER KIT A (KITS) ×2 IMPLANT
MARKER SKIN DUAL TIP RULER LAB (MISCELLANEOUS) ×2 IMPLANT
NEEDLE HYPO 25GX1X1/2 BEV (NEEDLE) ×2 IMPLANT
NEEDLE HYPO 27GX1-1/4 (NEEDLE) ×2 IMPLANT
NS IRRIG 500ML POUR BTL (IV SOLUTION) ×2 IMPLANT
SPONGE XRAY 4X4 16PLY STRL (MISCELLANEOUS) ×2 IMPLANT
STRAP BODY AND KNEE 60X3 (MISCELLANEOUS) ×2 IMPLANT
SUT VICRYL 5-0 (SUTURE) ×4 IMPLANT
SYR 3ML LL SCALE MARK (SYRINGE) ×2 IMPLANT
TOWEL OR 17X26 4PK STRL BLUE (TOWEL DISPOSABLE) ×2 IMPLANT
TUBING CONN 6MMX3.1M (TUBING) ×1
TUBING SUCTION CONN 0.25 STRL (TUBING) ×1 IMPLANT

## 2021-07-15 NOTE — H&P (Signed)
H&P has been reviewed and patient reevaluated, no changes necessary. To be downloaded later.  

## 2021-07-15 NOTE — Op Note (Signed)
07/15/2021  11:16 AM    Elana Alm  [id number]   Pre-Op Dx: Left lower lip mucosal lesion 1 cm wide by 2 cm in length  Post-op Dx: Same  Proc: Excision left lower lip mucosal lesion with advancement flap closure.  Surg:  Elon Alas Jkayla Spiewak  Anes:  GOT  EBL: 10 mL  Comp: None  Findings: A bluish looking vascular mass was underneath the mucosal surface on the inside of the left lower lip.  This went up into the vermilion slightly but did not extend forward to the vermilion border.  Procedure: The patient was given general anesthesia by oral endotracheal intubation.  The tube was taped to the right upper lip so it was not pulling on the lower lip to affect the cosmetic appearance.  The lip was everted some to expose the inside of the mucosa from the vermilion into the inside of the lower lip.  The lesion was bluish and raised and look like it had blood vessels underneath the mucosa.  The area was marked and then 3 mL of 1% Xylocaine with epi 1: 100,000 was used for infiltration around the entire area in the left lower lip.  This was allowed to sit for a few minutes for vasoconstriction.  A 15 blade was used to excise mucosa all around the lesion.  Dissection was then carried around underneath the lesion to separated from the underlying orbicularis oris muscle.  This was dissected free using bipolar cautery to help control bleeding.  The mass was avascular mass that was overlying this and was completely removed.  There were minor salivary glands on both sides and some attached more posteriorly/inferiorly.  Some minor salivary gland was removed as well that was attached to it.  The wound was then irrigated well and there is no significant bleeding as this was controlled while the mass was removed.  The wound had a flatter area on the vermilion that ran horizontal on the lip and then a more narrow area that went down to make it look almost like a upside down triangle.  Some of the lower  portion of the triangle was pulled together and closed with 5-0 Vicryl sutures that buried the knot.  These were inverted single mattress sutures.  If the wound was closed in a single row of stitches vertically it puckered the lip too much.  The upper half had the vermilion edge of the wound pulled down and closed horizontally in a T-shaped fashion.  This still caused some buckling of the anterior lip and a small burrows triangle was removed and further sutures placed to help smooth this out.  All of these were inverted 5-0 Vicryl mattress sutures with the knots..  The suture lines were all inside the lip and were not out on the outside of the vermilion border.  The lip was washed and covered with bacitracin ointment.  No bandage was applied as there is no wound on the outside.  The patient tolerated the procedure well.  She was awakened and taken to the recovery room in satisfactory condition.  There were no operative complications.  Dispo:   To PACU to be discharged home.  Plan: To follow-up in the office in 1 week to evaluate the wound to make sure it is healing well.  She can use Vaseline on her lip to help keep it lubricated.  She can start with liquids and soft foods but try not to open her mouth widely that would pull on the sutures.  I have given her Vicodin for pain as she says she has tolerated that recently even though she has had problems with morphine, oxycodone, and tramadol.  She likely will not need much narcotic at all but can switch over to Tylenol or ibuprofen within the next day or 2 since he should not have any long-term severe pain.  Elon Alas Averie Meiner  07/15/2021 11:16 AM

## 2021-07-15 NOTE — Anesthesia Procedure Notes (Signed)
Procedure Name: Intubation Date/Time: 07/15/2021 10:46 AM Performed by: Jeannene Patella, CRNA Pre-anesthesia Checklist: Patient identified, Emergency Drugs available, Suction available, Patient being monitored and Timeout performed Patient Re-evaluated:Patient Re-evaluated prior to induction Oxygen Delivery Method: Circle system utilized Preoxygenation: Pre-oxygenation with 100% oxygen Induction Type: IV induction Ventilation: Mask ventilation without difficulty Laryngoscope Size: Miller and 2 Grade View: Grade II Tube type: Oral Tube size: 7.0 mm Number of attempts: 1 Placement Confirmation: ETT inserted through vocal cords under direct vision, positive ETCO2 and breath sounds checked- equal and bilateral Tube secured with: Tape Dental Injury: Teeth and Oropharynx as per pre-operative assessment

## 2021-07-15 NOTE — Anesthesia Postprocedure Evaluation (Signed)
Anesthesia Post Note  Patient: Monica Delacruz  Procedure(s) Performed: EXCISION OF MUCOSA LIP LESION (Left: Mouth)     Patient location during evaluation: PACU Anesthesia Type: General Level of consciousness: awake Pain management: pain level controlled Vital Signs Assessment: post-procedure vital signs reviewed and stable Respiratory status: respiratory function stable Cardiovascular status: stable Postop Assessment: no signs of nausea or vomiting Anesthetic complications: no   No notable events documented.  Veda Canning

## 2021-07-15 NOTE — Transfer of Care (Signed)
Immediate Anesthesia Transfer of Care Note  Patient: Monica Delacruz  Procedure(s) Performed: EXCISION OF MUCOSA LIP LESION (Left: Mouth)  Patient Location: PACU  Anesthesia Type: General  Level of Consciousness: awake, alert  and patient cooperative  Airway and Oxygen Therapy: Patient Spontanous Breathing and Patient connected to supplemental oxygen  Post-op Assessment: Post-op Vital signs reviewed, Patient's Cardiovascular Status Stable, Respiratory Function Stable, Patent Airway and No signs of Nausea or vomiting  Post-op Vital Signs: Reviewed and stable  Complications: No notable events documented.

## 2021-07-15 NOTE — Anesthesia Preprocedure Evaluation (Addendum)
Anesthesia Evaluation  Patient identified by MRN, date of birth, ID band Patient awake    Reviewed: Allergy & Precautions, NPO status   Airway Mallampati: II  TM Distance: >3 FB     Dental   Pulmonary    Pulmonary exam normal        Cardiovascular  Rhythm:Regular Rate:Normal  HLD   Neuro/Psych  Headaches,    GI/Hepatic negative GI ROS,   Endo/Other    Renal/GU      Musculoskeletal  (+) Arthritis ,   Abdominal   Peds  Hematology   Anesthesia Other Findings   Reproductive/Obstetrics                             Anesthesia Physical Anesthesia Plan  ASA: 2  Anesthesia Plan: General   Post-op Pain Management:    Induction: Intravenous  PONV Risk Score and Plan: 3 and Treatment may vary due to age or medical condition, Dexamethasone and Ondansetron  Airway Management Planned: Oral ETT  Additional Equipment:   Intra-op Plan:   Post-operative Plan:   Informed Consent: I have reviewed the patients History and Physical, chart, labs and discussed the procedure including the risks, benefits and alternatives for the proposed anesthesia with the patient or authorized representative who has indicated his/her understanding and acceptance.       Plan Discussed with: CRNA  Anesthesia Plan Comments:        Anesthesia Quick Evaluation

## 2021-07-16 ENCOUNTER — Encounter: Payer: Self-pay | Admitting: Otolaryngology

## 2021-07-19 LAB — SURGICAL PATHOLOGY

## 2021-07-20 ENCOUNTER — Ambulatory Visit: Payer: Self-pay | Admitting: *Deleted

## 2021-07-20 NOTE — Telephone Encounter (Signed)
Pt called saying she had same day surgery on the 22nd.  She hd something removed ff the inside of her lip.  Friday she starting getting a sore throat but it has turned into a bad and cough.  She has not took a covid test.   Please advise  [phone]  Reason for Disposition  [1] Continuous (nonstop) coughing interferes with work or school AND [2] no improvement using cough treatment per Care Advice  Answer Assessment - Initial Assessment Questions 1. ONSET: "When did the cough begin?"      Friday night 2. SEVERITY: "How bad is the cough today?"      Productive- frequent- interrupting sleep 3. SPUTUM: "Describe the color of your sputum" (none, dry cough; clear, white, yellow, green)     Yellow /green 4. HEMOPTYSIS: "Are you coughing up any blood?" If so ask: "How much?" (flecks, streaks, tablespoons, etc.)     no 5. DIFFICULTY BREATHING: "Are you having difficulty breathing?" If Yes, ask: "How bad is it?" (e.g., mild, moderate, severe)    - MILD: No SOB at rest, mild SOB with walking, speaks normally in sentences, can lie down, no retractions, pulse < 100.    - MODERATE: SOB at rest, SOB with minimal exertion and prefers to sit, cannot lie down flat, speaks in phrases, mild retractions, audible wheezing, pulse 100-120.    - SEVERE: Very SOB at rest, speaks in single words, struggling to breathe, sitting hunched forward, retractions, pulse > 120      Ok- no SOB 6. FEVER: "Do you have a fever?" If Yes, ask: "What is your temperature, how was it measured, and when did it start?"     no 7. CARDIAC HISTORY: "Do you have any history of heart disease?" (e.g., heart attack, congestive heart failure)      no 8. LUNG HISTORY: "Do you have any history of lung disease?"  (e.g., pulmonary embolus, asthma, emphysema)     no 9. PE RISK FACTORS: "Do you have a history of blood clots?" (or: recent major surgery, recent prolonged travel, bedridden)     no 10. OTHER SYMPTOMS: "Do you have any other  symptoms?" (e.g., runny nose, wheezing, chest pain)       Sore throat 11. PREGNANCY: "Is there any chance you are pregnant?" "When was your last menstrual period?"       N/a 12. TRAVEL: "Have you traveled out of the country in the last month?" (e.g., travel history, exposures)       no  Protocols used: Cough - Acute Productive-A-AH

## 2021-07-20 NOTE — Telephone Encounter (Signed)
Call to patient- patient had surgical procedure 9/22 and did have anesthesia. Patient states she thought that the sore throat was from that- but she has now developed a cough. At first the cough was dry- but now it is productive. The sputum was white/clear but now is yellow/green. Patient advised to treat symptoms, do home COVID test and be seen at [facility]- patient states she is not going to [facility]- she does not want to catch something. Patient has no way to do virtual/e-visit.

## 2021-07-21 DIAGNOSIS — R059 Cough, unspecified: Secondary | ICD-10-CM | POA: Diagnosis not present

## 2021-07-21 DIAGNOSIS — Z20822 Contact with and (suspected) exposure to covid-19: Secondary | ICD-10-CM | POA: Diagnosis not present

## 2021-07-21 DIAGNOSIS — R058 Other specified cough: Secondary | ICD-10-CM | POA: Diagnosis not present

## 2021-07-21 DIAGNOSIS — Z03818 Encounter for observation for suspected exposure to other biological agents ruled out: Secondary | ICD-10-CM | POA: Diagnosis not present

## 2021-09-15 ENCOUNTER — Ambulatory Visit (INDEPENDENT_AMBULATORY_CARE_PROVIDER_SITE_OTHER): Payer: Medicare Other

## 2021-09-15 ENCOUNTER — Other Ambulatory Visit: Payer: Self-pay

## 2021-09-15 VITALS — BP 148/90 | HR 79 | Temp 97.9°F | Ht 65.0 in | Wt 173.0 lb

## 2021-09-15 DIAGNOSIS — Z23 Encounter for immunization: Secondary | ICD-10-CM | POA: Diagnosis not present

## 2021-09-15 DIAGNOSIS — Z0000 Encounter for general adult medical examination without abnormal findings: Secondary | ICD-10-CM | POA: Diagnosis not present

## 2021-09-15 NOTE — Patient Instructions (Signed)
Monica Delacruz , Thank you for taking time to come for your Medicare Wellness Visit. I appreciate your ongoing commitment to your health goals. Please review the following plan we discussed and let me know if I can assist you in the future.   Screening recommendations/referrals: Colonoscopy: 10/10/17, sent referral Mammogram: 02/10/21 Bone Density: 11/16/17 Recommended yearly ophthalmology/optometry visit for glaucoma screening and checkup Recommended yearly dental visit for hygiene and checkup  Vaccinations: Influenza vaccine: 09/15/21 Pneumococcal vaccine: 10/10/17 Tdap vaccine: 06/17/11, declined today Shingles vaccine: declined   Covid-19:05/21/20, 06/11/20, 12/09/20  Advanced directives: declined  Conditions/risks identified:   Next appointment: Follow up in one year for your annual wellness visit    Preventive Care 69 Years and Older, Female Preventive care refers to lifestyle choices and visits with your health care provider that can promote health and wellness. What does preventive care include? A yearly physical exam. This is also called an annual well check. Dental exams once or twice a year. Routine eye exams. Ask your health care provider how often you should have your eyes checked. Personal lifestyle choices, including: Daily care of your teeth and gums. Regular physical activity. Eating a healthy diet. Avoiding tobacco and drug use. Limiting alcohol use. Practicing safe sex. Taking low-dose aspirin every day. Taking vitamin and mineral supplements as recommended by your health care provider. What happens during an annual well check? The services and screenings done by your health care provider during your annual well check will depend on your age, overall health, lifestyle risk factors, and family history of disease. Counseling  Your health care provider may ask you questions about your: Alcohol use. Tobacco use. Drug use. Emotional well-being. Home and relationship  well-being. Sexual activity. Eating habits. History of falls. Memory and ability to understand (cognition). Work and work Statistician. Reproductive health. Screening  You may have the following tests or measurements: Height, weight, and BMI. Blood pressure. Lipid and cholesterol levels. These may be checked every 5 years, or more frequently if you are over 19 years old. Skin check. Lung cancer screening. You may have this screening every year starting at age 69 if you have a 30-pack-year history of smoking and currently smoke or have quit within the past 15 years. Fecal occult blood test (FOBT) of the stool. You may have this test every year starting at age 69. Flexible sigmoidoscopy or colonoscopy. You may have a sigmoidoscopy every 5 years or a colonoscopy every 10 years starting at age 69. Hepatitis C blood test. Hepatitis B blood test. Sexually transmitted disease (STD) testing. Diabetes screening. This is done by checking your blood sugar (glucose) after you have not eaten for a while (fasting). You may have this done every 1-3 years. Bone density scan. This is done to screen for osteoporosis. You may have this done starting at age 69. Mammogram. This may be done every 1-2 years. Talk to your health care provider about how often you should have regular mammograms. Talk with your health care provider about your test results, treatment options, and if necessary, the need for more tests. Vaccines  Your health care provider may recommend certain vaccines, such as: Influenza vaccine. This is recommended every year. Tetanus, diphtheria, and acellular pertussis (Tdap, Td) vaccine. You may need a Td booster every 10 years. Zoster vaccine. You may need this after age 62. Pneumococcal 13-valent conjugate (PCV13) vaccine. One dose is recommended after age 21. Pneumococcal polysaccharide (PPSV23) vaccine. One dose is recommended after age 57. Talk to your health care provider  about which  screenings and vaccines you need and how often you need them. This information is not intended to replace advice given to you by your health care provider. Make sure you discuss any questions you have with your health care provider. Document Released: 11/06/2015 Document Revised: 06/29/2016 Document Reviewed: 08/11/2015 Elsevier Interactive Patient Education  2017 [place] Prevention in the Home Falls can cause injuries. They can happen to people of all ages. There are many things you can do to make your home safe and to help prevent falls. What can I do on the outside of my home? Regularly fix the edges of walkways and driveways and fix any cracks. Remove anything that might make you trip as you walk through a door, such as a raised step or threshold. Trim any bushes or trees on the path to your home. Use bright outdoor lighting. Clear any walking paths of anything that might make someone trip, such as rocks or tools. Regularly check to see if handrails are loose or broken. Make sure that both sides of any steps have handrails. Any raised decks and porches should have guardrails on the edges. Have any leaves, snow, or ice cleared regularly. Use sand or salt on walking paths during winter. Clean up any spills in your garage right away. This includes oil or grease spills. What can I do in the bathroom? Use night lights. Install grab bars by the toilet and in the tub and shower. Do not use towel bars as grab bars. Use non-skid mats or decals in the tub or shower. If you need to sit down in the shower, use a plastic, non-slip stool. Keep the floor dry. Clean up any water that spills on the floor as soon as it happens. Remove soap buildup in the tub or shower regularly. Attach bath mats securely with double-sided non-slip rug tape. Do not have throw rugs and other things on the floor that can make you trip. What can I do in the bedroom? Use night lights. Make sure that you have a  light by your bed that is easy to reach. Do not use any sheets or blankets that are too big for your bed. They should not hang down onto the floor. Have a firm chair that has side arms. You can use this for support while you get dressed. Do not have throw rugs and other things on the floor that can make you trip. What can I do in the kitchen? Clean up any spills right away. Avoid walking on wet floors. Keep items that you use a lot in easy-to-reach places. If you need to reach something above you, use a strong step stool that has a grab bar. Keep electrical cords out of the way. Do not use floor polish or wax that makes floors slippery. If you must use wax, use non-skid floor wax. Do not have throw rugs and other things on the floor that can make you trip. What can I do with my stairs? Do not leave any items on the stairs. Make sure that there are handrails on both sides of the stairs and use them. Fix handrails that are broken or loose. Make sure that handrails are as long as the stairways. Check any carpeting to make sure that it is firmly attached to the stairs. Fix any carpet that is loose or worn. Avoid having throw rugs at the top or bottom of the stairs. If you do have throw rugs, attach them to the floor  with carpet tape. Make sure that you have a light switch at the top of the stairs and the bottom of the stairs. If you do not have them, ask someone to add them for you. What else can I do to help prevent falls? Wear shoes that: Do not have high heels. Have rubber bottoms. Are comfortable and fit you well. Are closed at the toe. Do not wear sandals. If you use a stepladder: Make sure that it is fully opened. Do not climb a closed stepladder. Make sure that both sides of the stepladder are locked into place. Ask someone to hold it for you, if possible. Clearly mark and make sure that you can see: Any grab bars or handrails. First and last steps. Where the edge of each step  is. Use tools that help you move around (mobility aids) if they are needed. These include: Canes. Walkers. Scooters. Crutches. Turn on the lights when you go into a dark area. Replace any light bulbs as soon as they burn out. Set up your furniture so you have a clear path. Avoid moving your furniture around. If any of your floors are uneven, fix them. If there are any pets around you, be aware of where they are. Review your medicines with your doctor. Some medicines can make you feel dizzy. This can increase your chance of falling. Ask your doctor what other things that you can do to help prevent falls. This information is not intended to replace advice given to you by your health care provider. Make sure you discuss any questions you have with your health care provider. Document Released: 08/06/2009 Document Revised: 03/17/2016 Document Reviewed: 11/14/2014 Elsevier Interactive Patient Education  2017 [employer].

## 2021-09-15 NOTE — Progress Notes (Signed)
Subjective:   Monica Delacruz is a 70 y.o. female who presents for Medicare Annual (Subsequent) preventive examination.  Review of Systems     Cardiac Risk Factors include: advanced age (>50men, >46 women)     Objective:    Today's Vitals   09/15/21 1111 09/15/21 1117  BP: (!) 148/90   Pulse: 79   Temp: 97.9 F (36.6 C)   TempSrc: Oral   SpO2: 97%   Weight: 173 lb (78.5 kg)   Height: 5\' 5"  (1.651 m)   PainSc: 0-No pain 0-No pain   Body mass index is 28.79 kg/m.  Advanced Directives 09/15/2021 07/15/2021 08/24/2020 02/25/2020 09/27/2016 09/23/2016 08/29/2016  Does Patient Have a Medical Advance Directive? No No No No No No No  Would patient like information on creating a medical advance directive? No - Patient declined Yes (MAU/Ambulatory/Procedural Areas - Information given) No - Patient declined - No - Patient declined No - Patient declined -  Pre-existing out of facility DNR order (yellow form or pink MOST form) - - - - - - -    Current Medications (verified) Outpatient Encounter Medications as of 09/15/2021  Medication Sig   butalbital-acetaminophen-caffeine (FIORICET) 50-325-40 MG tablet Take 1 tablet by mouth every 6 (six) hours as needed for migraine.   cetirizine (ZYRTEC) 10 MG tablet Take 10 mg by mouth daily.   fluticasone (FLONASE) 50 MCG/ACT nasal spray Place 1 spray into both nostrils daily as needed for allergies or rhinitis.   ibuprofen (ADVIL) 200 MG tablet Take 600 mg by mouth every 6 (six) hours as needed for moderate pain.   lovastatin (MEVACOR) 40 MG tablet TAKE 1 TABLET(40 MG) BY MOUTH DAILY   MEGARED OMEGA-3 KRILL OIL PO Take 1 tablet by mouth daily.    naproxen (NAPROSYN) 250 MG tablet Take 1 tablet (250 mg total) by mouth 2 (two) times daily with a meal. (Patient not taking: Reported on 07/01/2021)   VITAMIN D PO Take 3 tablets by mouth daily. 600 units per   Zinc Sulfate (ZINC-220 PO) Take by mouth daily. (Patient not taking: Reported on 07/01/2021)   No  facility-administered encounter medications on file as of 09/15/2021.    Allergies (verified) Tramadol, Aspirin, Morphine and related, Ofloxacin, Oxycodone hcl, Simvastatin, Sulfa antibiotics, and Adhesive [tape]   History: Past Medical History:  Diagnosis Date   Back pain    lumbar fracture   Diverticulosis    Headache    Migraine   History of bronchitis    2014   History of colon polyps    History of kidney stones    History of migraine    last one about 2wks ago   Hyperlipidemia    takes Lovasatin daily   Seasonal allergies    takes Claritin daily and Flonase daily   Past Surgical History:  Procedure Laterality Date   ABDOMINAL HYSTERECTOMY  1985   BACK SURGERY     BREAST BIOPSY Left 02/12/2020   Stereo Bx, X-clip, pending path    BREAST BIOPSY WITH RADIO FREQUENCY LOCALIZER Left 03/04/2020   Procedure: BREAST BIOPSY WITH RADIO FREQUENCY LOCALIZER;  Surgeon: Herbert Pun, MD;  Location: [facility];  Service: General;  Laterality: Left;   COLONOSCOPY     CYSTOSCOPY     CYSTOSCOPY W/ URETERAL STENT PLACEMENT Right 10/03/2016   Procedure: CYSTOSCOPY WITH STENT REPLACEMENT;  Surgeon: Hollice Espy, MD;  Location: [facility];  Service: Urology;  Laterality: Right;   CYSTOSCOPY/RETROGRADE/URETEROSCOPY Right 08/29/2016   Procedure: CYSTOSCOPY/RETROGRADE/stent placement;  Surgeon: Alexis Frock, MD;  Location: [facility];  Service: Urology;  Laterality: Right;   EXCISION OF TONGUE LESION Left 07/15/2021   Procedure: EXCISION OF MUCOSA LIP LESION;  Surgeon: Margaretha Sheffield, MD;  Location: [place];  Service: ENT;  Laterality: Left;   FOOT SURGERY     bilateral   JOINT REPLACEMENT Left 2009   knee   LAMINECTOMY WITH POSTERIOR LATERAL ARTHRODESIS LEVEL 4 N/A 07/12/2013   Procedure: Thoracic Ten Through Lumbar Three posterior lateral fusion with instrumentation;  Surgeon: Winfield Cunas, MD;  Location: [facility];  Service: Neurosurgery;  Laterality: N/A;  Thoracic  Ten Through Lumbar Three posterior lateral fusion with instrumentation   LITHOTRIPSY     x 2   NASAL SINUS SURGERY     x 2   URETEROSCOPY WITH HOLMIUM LASER LITHOTRIPSY Right 10/03/2016   Procedure: URETEROSCOPY WITH HOLMIUM LASER LITHOTRIPSY;  Surgeon: Hollice Espy, MD;  Location: [facility];  Service: Urology;  Laterality: Right;   Family History  Problem Relation Age of Onset   Hematuria Mother    Diabetes type II Mother    Hypertension Father    Stomach cancer Father    Breast cancer Maternal Grandmother    Social History   Socioeconomic History   Marital status: Married    Spouse name: Not on file   Number of children: 2   Years of education: Not on file   Highest education level: Some college, no degree  Occupational History   Occupation: retired  Tobacco Use   Smoking status: Never   Smokeless tobacco: Never  Vaping Use   Vaping Use: Never used  Substance and Sexual Activity   Alcohol use: Yes    Comment: wine rarely   Drug use: No   Sexual activity: Yes    Birth control/protection: Surgical  Other Topics Concern   Not on file  Social History Narrative   Not on file   Social Determinants of Health   Financial Resource Strain: Low Risk    Difficulty of Paying Living Expenses: Not hard at all  Food Insecurity: No Food Insecurity   Worried About Charity fundraiser in the Last Year: Never true   [place] in the Last Year: Never true  Transportation Needs: No Transportation Needs   Lack of Transportation (Medical): No   Lack of Transportation (Non-Medical): No  Physical Activity: Insufficiently Active   Days of Exercise per Week: 3 days   Minutes of Exercise per Session: 40 min  Stress: No Stress Concern Present   Feeling of Stress : Not at all  Social Connections: Moderately Integrated   Frequency of Communication with Friends and Family: More than three times a week   Frequency of Social Gatherings with Friends and Family: More than three times a  week   Attends Religious Services: More than 4 times per year   Active Member of [employer] or Organizations: No   Attends Music therapist: Never   Marital Status: Married    Tobacco Counseling Counseling given: Not Answered   Clinical Intake:  Pre-visit preparation completed: Yes  Pain : No/denies pain Pain Score: 0-No pain     Nutritional Status: BMI 25 -29 Overweight Nutritional Risks: None Diabetes: No  How often do you need to have someone help you when you read instructions, pamphlets, or other written materials from your doctor or pharmacy?: 1 - Never   Interpreter Needed?: No  Information entered by :: Kirke Shaggy, LPN  Activities of Daily Living In your present state of health, do you have any difficulty performing the following activities: 09/15/2021 07/15/2021  Hearing? N N  Vision? N N  Difficulty concentrating or making decisions? N N  Walking or climbing stairs? N N  Dressing or bathing? N N  Doing errands, shopping? N -  Preparing Food and eating ? N -  Using the Toilet? N -  In the past six months, have you accidently leaked urine? N -  Do you have problems with loss of bowel control? N -  Managing your Medications? N -  Managing your Finances? N -  Housekeeping or managing your Housekeeping? N -  Some recent data might be hidden    Patient Care Team: Mikey Kirschner, PA-C as PCP - General (Physician Assistant) System, Provider Not In  Indicate any recent Medical Services you may have received from other than [facility] providers in the past year (date may be approximate).     Assessment:   This is a routine wellness examination for [place].  Hearing/Vision screen Hearing Screening - Comments:: Hearing is good Vision Screening - Comments:: Wear contacts- [place]  Dietary issues and exercise activities discussed: Current Exercise Habits: Home exercise routine, Type of exercise: walking, Time (Minutes): 60, Frequency  (Times/Week): 3, Weekly Exercise (Minutes/Week): 180, Intensity: Mild   Goals Addressed             This Visit's Progress    Weight (lb) < 173 lb (78.5 kg)   173 lb (78.5 kg)      Depression Screen PHQ 2/9 Scores 09/15/2021 09/15/2021 04/14/2021 10/14/2020 08/24/2020 08/20/2019 10/10/2017  PHQ - 2 Score 0 0 0 0 0 0 0  PHQ- 9 Score - - - 0 - - 0    Fall Risk Fall Risk  09/15/2021 04/14/2021 10/14/2020 08/24/2020 08/20/2019  Falls in the past year? 0 0 0 0 0  Number falls in past yr: 0 0 0 0 0  Injury with Fall? 0 0 0 0 0  Risk for fall due to : No Fall Risks No Fall Risks No Fall Risks - -  Follow up Falls prevention discussed Falls evaluation completed Falls evaluation completed - -    FALL RISK PREVENTION PERTAINING TO THE HOME:  Any stairs in or around the home? Yes  If so, are there any without handrails? No  Home free of loose throw rugs in walkways, pet beds, electrical cords, etc? Yes  Adequate lighting in your home to reduce risk of falls? Yes   ASSISTIVE DEVICES UTILIZED TO PREVENT FALLS:  Life alert? No  Use of a cane, walker or w/c? No  Grab bars in the bathroom? No  Shower chair or bench in shower? No  Elevated toilet seat or a handicapped toilet? No   TIMED UP AND GO:  Was the test performed? Yes .  Length of time to ambulate 10 feet: 3 sec.   Gait steady and fast without use of assistive device  Cognitive Function:        Immunizations Immunization History  Administered Date(s) Administered   Fluad Quad(high Dose 65+) 08/20/2019, 10/14/2020, 09/15/2021   Influenza, High Dose Seasonal PF 10/10/2017   Influenza,inj,Quad PF,6+ Mos 07/14/2013, 08/23/2016   PFIZER(Purple Top)SARS-COV-2 Vaccination 05/21/2020, 06/11/2020   Pneumococcal Conjugate-13 10/10/2017   Pneumococcal Polysaccharide-23 08/30/2016   Tdap 06/17/2011    TDAP status: Due, Education has been provided regarding the importance of this vaccine. Advised may receive this vaccine at  local pharmacy  or Health Dept. Aware to provide a copy of the vaccination record if obtained from local pharmacy or Health Dept. Verbalized acceptance and understanding.  Flu Vaccine status: Completed at today's visit  Pneumococcal vaccine status: Up to date  Covid-19 vaccine status: Completed vaccines  Qualifies for Shingles Vaccine? Yes   Zostavax completed No   Shingrix Completed?: No.    Education has been provided regarding the importance of this vaccine. Patient has been advised to call insurance company to determine out of pocket expense if they have not yet received this vaccine. Advised may also receive vaccine at local pharmacy or Health Dept. Verbalized acceptance and understanding.  Screening Tests Health Maintenance  Topic Date Due   Zoster Vaccines- Shingrix (1 of 2) Never done   COVID-19 Vaccine (3 - Pfizer risk series) 07/09/2020   TETANUS/TDAP  06/16/2021   Pneumonia Vaccine 34+ Years old (3 - PPSV23 if available, else PCV20) 08/30/2021   COLONOSCOPY (Pts 45-21yrs Insurance coverage will need to be confirmed)  04/14/2022 (Originally 08/04/2016)   DEXA SCAN  11/16/2022   MAMMOGRAM  02/11/2023   INFLUENZA VACCINE  Completed   Hepatitis C Screening  Completed   HPV VACCINES  Aged Out    Health Maintenance  Health Maintenance Due  Topic Date Due   Zoster Vaccines- Shingrix (1 of 2) Never done   COVID-19 Vaccine (3 - Pfizer risk series) 07/09/2020   TETANUS/TDAP  06/16/2021   Pneumonia Vaccine 73+ Years old (3 - PPSV23 if available, else PCV20) 08/30/2021    Colorectal cancer screening: Type of screening: Colonoscopy. Completed 10/10/17. Repeat every 5 years  Mammogram status: Completed 02/10/21. Repeat every year  Bone Density status: Completed 11/16/17. Results reflect: Bone density results: OSTEOPENIA. Repeat every 5 years.  Lung Cancer Screening: (Low Dose CT Chest recommended if Age 13-80 years, 30 pack-year currently smoking OR have quit w/in 15years.) does  not qualify.    Additional Screening:  Hepatitis C Screening: does qualify; Completed 09/03/12  Vision Screening: Recommended annual ophthalmology exams for early detection of glaucoma and other disorders of the eye. Is the patient up to date with their annual eye exam?  Yes  Who is the provider or what is the name of the office in which the patient attends annual eye exams? Dr. Francesca Oman   Dental Screening: Recommended annual dental exams for proper oral hygiene  Community Resource Referral / Chronic Care Management: CRR required this visit?  No   CCM required this visit?  No      Plan:     I have personally reviewed and noted the following in the patient's chart:   Medical and social history Use of alcohol, tobacco or illicit drugs  Current medications and supplements including opioid prescriptions.  Functional ability and status Nutritional status Physical activity Advanced directives List of other physicians Hospitalizations, surgeries, and ER visits in previous 12 months Vitals Screenings to include cognitive, depression, and falls Referrals and appointments  In addition, I have reviewed and discussed with patient certain preventive protocols, quality metrics, and best practice recommendations. A written personalized care plan for preventive services as well as general preventive health recommendations were provided to patient.     Dionisio David, LPN   28/31/5176   Nurse Notes: none

## 2021-09-21 NOTE — Addendum Note (Signed)
Addended by: Dionisio David on: 09/21/2021 10:20 AM   Modules accepted: Orders

## 2021-09-23 DIAGNOSIS — H35372 Puckering of macula, left eye: Secondary | ICD-10-CM | POA: Diagnosis not present

## 2021-09-23 DIAGNOSIS — H52223 Regular astigmatism, bilateral: Secondary | ICD-10-CM | POA: Diagnosis not present

## 2021-09-23 DIAGNOSIS — H524 Presbyopia: Secondary | ICD-10-CM | POA: Diagnosis not present

## 2021-09-23 DIAGNOSIS — H5203 Hypermetropia, bilateral: Secondary | ICD-10-CM | POA: Diagnosis not present

## 2021-10-22 ENCOUNTER — Ambulatory Visit (INDEPENDENT_AMBULATORY_CARE_PROVIDER_SITE_OTHER): Payer: Medicare Other | Admitting: Physician Assistant

## 2021-10-22 ENCOUNTER — Other Ambulatory Visit: Payer: Self-pay

## 2021-10-22 ENCOUNTER — Encounter: Payer: Medicare Other | Admitting: Physician Assistant

## 2021-10-22 ENCOUNTER — Encounter: Payer: Medicare Other | Admitting: Family Medicine

## 2021-10-22 ENCOUNTER — Encounter: Payer: Self-pay | Admitting: Physician Assistant

## 2021-10-22 VITALS — BP 134/91 | HR 83 | Temp 98.3°F | Resp 16 | Ht 65.0 in | Wt 170.0 lb

## 2021-10-22 DIAGNOSIS — M1990 Unspecified osteoarthritis, unspecified site: Secondary | ICD-10-CM | POA: Diagnosis not present

## 2021-10-22 DIAGNOSIS — Z0000 Encounter for general adult medical examination without abnormal findings: Secondary | ICD-10-CM

## 2021-10-22 DIAGNOSIS — Z13228 Encounter for screening for other metabolic disorders: Secondary | ICD-10-CM

## 2021-10-22 DIAGNOSIS — G43109 Migraine with aura, not intractable, without status migrainosus: Secondary | ICD-10-CM

## 2021-10-22 DIAGNOSIS — Z1329 Encounter for screening for other suspected endocrine disorder: Secondary | ICD-10-CM

## 2021-10-22 DIAGNOSIS — E7800 Pure hypercholesterolemia, unspecified: Secondary | ICD-10-CM

## 2021-10-22 DIAGNOSIS — R350 Frequency of micturition: Secondary | ICD-10-CM | POA: Diagnosis not present

## 2021-10-22 DIAGNOSIS — Z23 Encounter for immunization: Secondary | ICD-10-CM

## 2021-10-22 DIAGNOSIS — N3001 Acute cystitis with hematuria: Secondary | ICD-10-CM | POA: Diagnosis not present

## 2021-10-22 DIAGNOSIS — M8580 Other specified disorders of bone density and structure, unspecified site: Secondary | ICD-10-CM | POA: Diagnosis not present

## 2021-10-22 LAB — POCT URINALYSIS DIPSTICK
Bilirubin, UA: POSITIVE
Blood, UA: POSITIVE
Glucose, UA: NEGATIVE
Ketones, UA: POSITIVE
Nitrite, UA: NEGATIVE
Protein, UA: POSITIVE — AB
Spec Grav, UA: >=1.03 — AB (ref 1.010–1.025)
Urobilinogen, UA: 0.2 E.U./dL
pH, UA: 6 (ref 5.0–8.0)

## 2021-10-22 MED ORDER — BUTALBITAL-APAP-CAFFEINE 50-325-40 MG PO TABS: 1.0000 | ORAL_TABLET | Freq: Four times a day (QID) | ORAL | 0 refills | Status: DC | PRN

## 2021-10-22 MED ORDER — NITROFURANTOIN MONOHYD MACRO 100 MG PO CAPS: 100.0000 mg | ORAL_CAPSULE | Freq: Two times a day (BID) | ORAL | 0 refills | Status: AC

## 2021-10-22 MED ORDER — LOVASTATIN 40 MG PO TABS: 40.0000 mg | ORAL_TABLET | Freq: Every day | ORAL | 2 refills | Status: DC

## 2021-10-22 MED ORDER — TETANUS-DIPHTH-ACELL PERTUSSIS 5-2.5-18.5 LF-MCG/0.5 IM SUSP: 0.5000 mL | Freq: Once | INTRAMUSCULAR | 0 refills | Status: AC

## 2021-10-22 MED ORDER — SHINGRIX 50 MCG/0.5ML IM SUSR
0.5000 mL | Freq: Once | INTRAMUSCULAR | 0 refills | Status: AC
Start: 2021-10-22 — End: 2021-10-22

## 2021-10-22 NOTE — Assessment & Plan Note (Signed)
Chronic with intermittent exacerbations  Stable on current medications Refill provided of fioricet

## 2021-10-22 NOTE — Patient Instructions (Addendum)
° °  I have sent in your refills as requested I have sent in an antibiotic for your UTI  It is called Nitrofurantoin (Macrobid) 100mg  to be taken by mouth twice per day for 5 days- please finish the entire course to ensure full infection resolution  I have included some information concerning heart -healthy diets, health maintenance and advance directives for you to review.   Please complete your blood work at your earliest convenience and we will inform you of any results that need attention  It was nice to meet you and I appreciate the opportunity to be involved in your care

## 2021-10-22 NOTE — Progress Notes (Signed)
Complete physical exam   Patient: Monica Delacruz   DOB: Jun 20, 1951   70 y.o. Female  MRN: [id number] Visit Date: 10/22/2021  Today's healthcare provider: Dani Gobble Amiir Heckard, PA-C  Introduced myself to the patient as a Journalist, newspaper and provided education on APPs in clinical practice.    No chief complaint on file.  Subjective    Monica Delacruz is a 70 y.o. female who presents today for a complete physical exam.  She reports consuming a general diet. Home exercise routine includes walking. She generally feels fairly well. She reports sleeping fairly well. She does not have additional problems to discuss today.  HPI  Patient had AWV with [facility] on 09/15/2021.   Tetanus vaccination- received today in office  Influenza: up to date  Shingles vaccination- rx for first Shingles vaccination sent to pharmacy  Pneumonia vaccine- has received two vaccines previously   Colonoscopy scheduled for next year  DEXA scan due in 2024   Mammogram due in 2024  Pap/pelvic exam - most recent performed approx 2018   Diet: reports "not too good" over holidays. States she has plans to try Mediterranean diet in the new year.   Exercise: States she is walking and babysitting her toddler great granddaughter. Walking 4 days per week for 30 -45 minutes each time.   Sleep: States sleep is "pretty good". Getting 6-7 hours per night, feels well rested most AM  Mood:Denies depressed mood or anxiety, Recent phq2 was negative   CC: reports concerns for UTI, with symptoms of urgency, "pinching" with urination, incomplete voiding   Reports it started over last few days   Past Medical History:  Diagnosis Date   Back pain    lumbar fracture   Diverticulosis    Headache    Migraine   History of bronchitis    2014   History of colon polyps    History of kidney stones    History of migraine    last one about 2wks ago   Hyperlipidemia    takes Lovasatin daily   Seasonal allergies    takes Claritin  daily and Flonase daily   Past Surgical History:  Procedure Laterality Date   ABDOMINAL HYSTERECTOMY  1985   BACK SURGERY     BREAST BIOPSY Left 02/12/2020   Stereo Bx, X-clip, pending path    BREAST BIOPSY WITH RADIO FREQUENCY LOCALIZER Left 03/04/2020   Procedure: BREAST BIOPSY WITH RADIO FREQUENCY LOCALIZER;  Surgeon: Herbert Pun, MD;  Location: [facility];  Service: General;  Laterality: Left;   COLONOSCOPY     CYSTOSCOPY     CYSTOSCOPY W/ URETERAL STENT PLACEMENT Right 10/03/2016   Procedure: CYSTOSCOPY WITH STENT REPLACEMENT;  Surgeon: Hollice Espy, MD;  Location: [facility];  Service: Urology;  Laterality: Right;   CYSTOSCOPY/RETROGRADE/URETEROSCOPY Right 08/29/2016   Procedure: CYSTOSCOPY/RETROGRADE/stent placement;  Surgeon: Alexis Frock, MD;  Location: [facility];  Service: Urology;  Laterality: Right;   EXCISION OF TONGUE LESION Left 07/15/2021   Procedure: EXCISION OF MUCOSA LIP LESION;  Surgeon: Margaretha Sheffield, MD;  Location: [place];  Service: ENT;  Laterality: Left;   FOOT SURGERY     bilateral   JOINT REPLACEMENT Left 2009   knee   LAMINECTOMY WITH POSTERIOR LATERAL ARTHRODESIS LEVEL 4 N/A 07/12/2013   Procedure: Thoracic Ten Through Lumbar Three posterior lateral fusion with instrumentation;  Surgeon: Winfield Cunas, MD;  Location: [facility];  Service: Neurosurgery;  Laterality: N/A;  Thoracic Ten Through Lumbar  Three posterior lateral fusion with instrumentation   LITHOTRIPSY     x 2   NASAL SINUS SURGERY     x 2   URETEROSCOPY WITH HOLMIUM LASER LITHOTRIPSY Right 10/03/2016   Procedure: URETEROSCOPY WITH HOLMIUM LASER LITHOTRIPSY;  Surgeon: Hollice Espy, MD;  Location: [facility];  Service: Urology;  Laterality: Right;   Social History   Socioeconomic History   Marital status: Married    Spouse name: Not on file   Number of children: 2   Years of education: Not on file   Highest education level: Some college, no degree  Occupational History    Occupation: retired  Tobacco Use   Smoking status: Never   Smokeless tobacco: Never  Vaping Use   Vaping Use: Never used  Substance and Sexual Activity   Alcohol use: Yes    Comment: wine rarely   Drug use: No   Sexual activity: Yes    Birth control/protection: Surgical  Other Topics Concern   Not on file  Social History Narrative   Not on file   Social Determinants of Health   Financial Resource Strain: Low Risk    Difficulty of Paying Living Expenses: Not hard at all  Food Insecurity: No Food Insecurity   Worried About Charity fundraiser in the Last Year: Never true   [place] in the Last Year: Never true  Transportation Needs: No Transportation Needs   Lack of Transportation (Medical): No   Lack of Transportation (Non-Medical): No  Physical Activity: Insufficiently Active   Days of Exercise per Week: 3 days   Minutes of Exercise per Session: 40 min  Stress: No Stress Concern Present   Feeling of Stress : Not at all  Social Connections: Moderately Integrated   Frequency of Communication with Friends and Family: More than three times a week   Frequency of Social Gatherings with Friends and Family: More than three times a week   Attends Religious Services: More than 4 times per year   Active Member of [employer] or Organizations: No   Attends Music therapist: Never   Marital Status: Married  Human resources officer Violence: Not At Risk   Fear of Current or Ex-Partner: No   Emotionally Abused: No   Physically Abused: No   Sexually Abused: No   Family Status  Relation Name Status   Mother  Deceased   Father  Deceased   MGM  (Not Specified)   Family History  Problem Relation Age of Onset   Hematuria Mother    Diabetes type II Mother    Hypertension Father    Stomach cancer Father    Breast cancer Maternal Grandmother    Allergies  Allergen Reactions   Tramadol Diarrhea, Nausea And Vomiting and Nausea Only    Other reaction(s): Vomiting    Aspirin Other (See Comments)    Burning. Burns stomach Other reaction(s): Abdominal Pain, Other (See Comments) Burning.   Morphine And Related Itching   Ofloxacin Nausea Only   Oxycodone Hcl     Weird dreams   Simvastatin     GI upset   Sulfa Antibiotics Hives, Itching and Swelling   Adhesive [Tape] Other (See Comments)    Blisters skin    Patient Care Team: Emelia Loron as PCP - General (Physician Assistant) System, Provider Not In   Medications: Outpatient Medications Prior to Visit  Medication Sig   butalbital-acetaminophen-caffeine (FIORICET) 50-325-40 MG tablet Take 1 tablet by mouth every 6 (six) hours as  needed for migraine.   cetirizine (ZYRTEC) 10 MG tablet Take 10 mg by mouth daily.   fluticasone (FLONASE) 50 MCG/ACT nasal spray Place 1 spray into both nostrils daily as needed for allergies or rhinitis.   ibuprofen (ADVIL) 200 MG tablet Take 600 mg by mouth every 6 (six) hours as needed for moderate pain.   lovastatin (MEVACOR) 40 MG tablet TAKE 1 TABLET(40 MG) BY MOUTH DAILY   MEGARED OMEGA-3 KRILL OIL PO Take 1 tablet by mouth daily.    naproxen (NAPROSYN) 250 MG tablet Take 1 tablet (250 mg total) by mouth 2 (two) times daily with a meal. (Patient not taking: Reported on 07/01/2021)   VITAMIN D PO Take 3 tablets by mouth daily. 600 units per   Zinc Sulfate (ZINC-220 PO) Take by mouth daily. (Patient not taking: Reported on 07/01/2021)   No facility-administered medications prior to visit.    Review of Systems  Constitutional:  Negative for fatigue, fever and unexpected weight change.  HENT:  Negative for congestion, dental problem, hearing loss, sore throat, tinnitus and trouble swallowing.   Eyes:  Negative for photophobia and visual disturbance.  Respiratory:  Negative for cough, chest tightness, shortness of breath and wheezing.   Cardiovascular:  Negative for chest pain, palpitations and leg swelling.  Gastrointestinal:  Negative for anal bleeding, blood  in stool, constipation, diarrhea, nausea and vomiting.  Endocrine: Negative for polydipsia, polyphagia and polyuria.  Genitourinary:  Positive for difficulty urinating (incomplete voiding), dysuria and frequency.  Musculoskeletal:  Negative for arthralgias, myalgias, neck pain and neck stiffness.  Skin:  Negative for rash.  Neurological:  Negative for dizziness, weakness, light-headedness, numbness and headaches.  Psychiatric/Behavioral:  Negative for dysphoric mood, sleep disturbance and suicidal ideas. The patient is not nervous/anxious.      Objective    There were no vitals taken for this visit.   Physical Exam Vitals reviewed.  Constitutional:      Appearance: Normal appearance. She is well-developed, well-groomed and overweight.  HENT:     Head: Normocephalic and atraumatic.     Right Ear: Tympanic membrane, ear canal and external ear normal.     Left Ear: Tympanic membrane, ear canal and external ear normal.     Nose: Nose normal.     Mouth/Throat:     Mouth: Mucous membranes are moist.     Pharynx: Oropharynx is clear. No oropharyngeal exudate or posterior oropharyngeal erythema.  Eyes:     Extraocular Movements: Extraocular movements intact.     Conjunctiva/sclera: Conjunctivae normal.     Pupils: Pupils are equal, round, and reactive to light.  Neck:     Thyroid: No thyroid mass, thyromegaly or thyroid tenderness.     Trachea: Trachea and phonation normal.  Cardiovascular:     Rate and Rhythm: Normal rate and regular rhythm.     Pulses: Normal pulses.     Heart sounds: Normal heart sounds.  Pulmonary:     Effort: Pulmonary effort is normal.     Breath sounds: Normal breath sounds. No decreased breath sounds, wheezing, rhonchi or rales.  Abdominal:     General: Abdomen is flat. Bowel sounds are normal.     Palpations: Abdomen is soft.  Musculoskeletal:        General: Normal range of motion.     Cervical back: Normal range of motion and neck supple.     Right  lower leg: No edema.     Left lower leg: No edema.  Lymphadenopathy:  Head:     Right side of head: No submental or submandibular adenopathy.     Left side of head: No submental or submandibular adenopathy.     Upper Body:     Right upper body: No supraclavicular adenopathy.     Left upper body: No supraclavicular adenopathy.  Skin:    General: Skin is warm and dry.     Capillary Refill: Capillary refill takes less than 2 seconds.  Neurological:     General: No focal deficit present.     Mental Status: She is alert and oriented to person, place, and time.     Sensory: Sensation is intact.     Motor: Motor function is intact.     Coordination: Coordination is intact. Finger-Nose-Finger Test and Heel to [place] Test normal.     Gait: Gait is intact.     Deep Tendon Reflexes:     Reflex Scores:      Tricep reflexes are 2+ on the right side and 2+ on the left side.      Patellar reflexes are 2+ on the right side and 2+ on the left side. Psychiatric:        Attention and Perception: Attention normal.        Mood and Affect: Mood normal.        Speech: Speech normal.        Behavior: Behavior normal. Behavior is cooperative.        Thought Content: Thought content normal.        Judgment: Judgment normal.      Last depression screening scores PHQ 2/9 Scores 09/15/2021 09/15/2021 04/14/2021  PHQ - 2 Score 0 0 0  PHQ- 9 Score - - -   Last fall risk screening Fall Risk  09/15/2021  Falls in the past year? 0  Number falls in past yr: 0  Injury with Fall? 0  Risk for fall due to : No Fall Risks  Follow up Falls prevention discussed   Last Audit-C alcohol use screening Alcohol Use Disorder Test (AUDIT) 09/15/2021  1. How often do you have a drink containing alcohol? 1  2. How many drinks containing alcohol do you have on a typical day when you are drinking? 0  3. How often do you have six or more drinks on one occasion? 0  AUDIT-C Score 1  Alcohol Brief Interventions/Follow-up  -   A score of 3 or more in women, and 4 or more in men indicates increased risk for alcohol abuse, EXCEPT if all of the points are from question 1   No results found for any visits on 10/22/21.  Assessment & Plan    Routine Health Maintenance and Physical Exam  Exercise Activities and Dietary recommendations  Goals      DIET - REDUCE SUGAR INTAKE     Recommend to start back on low carbohydrate diet to aid with with weight loss.      Weight (lb) < 173 lb (78.5 kg)        Immunization History  Administered Date(s) Administered   Fluad Quad(high Dose 65+) 08/20/2019, 10/14/2020, 09/15/2021   Influenza, High Dose Seasonal PF 10/10/2017   Influenza,inj,Quad PF,6+ Mos 07/14/2013, 08/23/2016   PFIZER(Purple Top)SARS-COV-2 Vaccination 05/21/2020, 06/11/2020   Pneumococcal Conjugate-13 10/10/2017   Pneumococcal Polysaccharide-23 08/30/2016   Tdap 06/17/2011    Health Maintenance  Topic Date Due   Zoster Vaccines- Shingrix (1 of 2) Never done   COVID-19 Vaccine (3 - Pfizer risk series) 07/09/2020  TETANUS/TDAP  06/16/2021   Pneumonia Vaccine 45+ Years old (3 - PPSV23 if available, else PCV20) 08/30/2021   COLONOSCOPY (Pts 45-46yrs Insurance coverage will need to be confirmed)  04/14/2022 (Originally 08/04/2016)   DEXA SCAN  11/16/2022   MAMMOGRAM  02/11/2023   INFLUENZA VACCINE  Completed   Hepatitis C Screening  Completed   HPV VACCINES  Aged Out    Discussed health benefits of physical activity, and encouraged her to engage in regular exercise appropriate for her age and condition.  Problem List Items Addressed This Visit       Cardiovascular and Mediastinum   Migraine with aura and without status migrainosus, not intractable    Chronic with intermittent exacerbations  Stable on current medications Refill provided of fioricet       Relevant Medications   lovastatin (MEVACOR) 40 MG tablet   butalbital-acetaminophen-caffeine (FIORICET) 50-325-40 MG tablet      Musculoskeletal and Integument   Arthritis, degenerative   Relevant Medications   butalbital-acetaminophen-caffeine (FIORICET) 50-325-40 MG tablet   Osteopenia     Other   Hypercholesterolemia without hypertriglyceridemia    Chronic condition currently stable  Well managed with current regimen Continue current medications  Refill of Lovastatin provided Lipid panel ordered today          Relevant Medications   lovastatin (MEVACOR) 40 MG tablet   Other Relevant Orders   Lipid panel   TSH   CBC w/Diff/Platelet   Comprehensive Metabolic Panel (CMET)   Other Visit Diagnoses     Annual physical exam    -  Primary   Relevant Orders   Lipid panel   TSH   CBC w/Diff/Platelet   Comprehensive Metabolic Panel (CMET)   Screening for thyroid disorder       Relevant Orders   Lipid panel   TSH   CBC w/Diff/Platelet   Comprehensive Metabolic Panel (CMET)   Screening for metabolic disorder       Relevant Orders   Lipid panel   TSH   CBC w/Diff/Platelet   Comprehensive Metabolic Panel (CMET)   Frequency of urination       Relevant Orders   POCT urinalysis dipstick (Completed)   Need for shingles vaccine       Relevant Medications   Zoster Vaccine Adjuvanted [facility]) injection   Need for tetanus booster       Relevant Medications   Tdap (BOOSTRIX) 5-2.5-18.5 LF-MCG/0.5 injection   Acute cystitis with hematuria       Relevant Medications   nitrofurantoin, macrocrystal-monohydrate, (MACROBID) 100 MG capsule   Other Relevant Orders   POCT urinalysis dipstick (Completed)   CULTURE, URINE COMPREHENSIVE       The entirety of the information documented in the History of Present Illness, Review of Systems and Physical Exam were personally obtained by me. Portions of this information were initially documented by the CMA and reviewed by me for thoroughness and accuracy.   Dani Gobble Khyan Oats, PA-C     Almon Register, PA-C  [employer] [phone]  (phone) [phone] (fax)  [place]

## 2021-10-22 NOTE — Assessment & Plan Note (Addendum)
Chronic condition currently stable  Well managed with current regimen Continue current medications  Refill of Lovastatin provided Lipid panel ordered today

## 2021-10-27 DIAGNOSIS — M8580 Other specified disorders of bone density and structure, unspecified site: Secondary | ICD-10-CM | POA: Diagnosis not present

## 2021-10-27 DIAGNOSIS — Z0000 Encounter for general adult medical examination without abnormal findings: Secondary | ICD-10-CM | POA: Diagnosis not present

## 2021-10-27 DIAGNOSIS — E7800 Pure hypercholesterolemia, unspecified: Secondary | ICD-10-CM | POA: Diagnosis not present

## 2021-10-27 DIAGNOSIS — M1990 Unspecified osteoarthritis, unspecified site: Secondary | ICD-10-CM | POA: Diagnosis not present

## 2021-10-27 LAB — CULTURE, URINE COMPREHENSIVE

## 2021-10-28 LAB — CBC WITH DIFFERENTIAL/PLATELET
Basophils Absolute: 0.1 10*3/uL (ref 0.0–0.2)
Basos: 1 %
EOS (ABSOLUTE): 0.1 10*3/uL (ref 0.0–0.4)
Eos: 1 %
Hematocrit: 41.8 % (ref 34.0–46.6)
Hemoglobin: 14 g/dL (ref 11.1–15.9)
Immature Grans (Abs): 0 10*3/uL (ref 0.0–0.1)
Immature Granulocytes: 0 %
Lymphocytes Absolute: 2 10*3/uL (ref 0.7–3.1)
Lymphs: 40 %
MCH: 29.6 pg (ref 26.6–33.0)
MCHC: 33.5 g/dL (ref 31.5–35.7)
MCV: 88 fL (ref 79–97)
Monocytes Absolute: 0.5 10*3/uL (ref 0.1–0.9)
Monocytes: 9 %
Neutrophils Absolute: 2.4 10*3/uL (ref 1.4–7.0)
Neutrophils: 49 %
Platelets: 269 10*3/uL (ref 150–450)
RBC: 4.73 x10E6/uL (ref 3.77–5.28)
RDW: 12.2 % (ref 11.7–15.4)
WBC: 4.9 10*3/uL (ref 3.4–10.8)

## 2021-10-28 LAB — COMPREHENSIVE METABOLIC PANEL
ALT: 34 IU/L — ABNORMAL HIGH (ref 0–32)
AST: 26 IU/L (ref 0–40)
Albumin/Globulin Ratio: 2.3 — ABNORMAL HIGH (ref 1.2–2.2)
Albumin: 4.4 g/dL (ref 3.8–4.8)
Alkaline Phosphatase: 99 IU/L (ref 44–121)
BUN/Creatinine Ratio: 21 (ref 12–28)
BUN: 18 mg/dL (ref 8–27)
Bilirubin Total: 0.4 mg/dL (ref 0.0–1.2)
CO2: 21 mmol/L (ref 20–29)
Calcium: 8.8 mg/dL (ref 8.7–10.3)
Chloride: 107 mmol/L — ABNORMAL HIGH (ref 96–106)
Creatinine, Ser: 0.86 mg/dL (ref 0.57–1.00)
Globulin, Total: 1.9 g/dL (ref 1.5–4.5)
Glucose: 97 mg/dL (ref 70–99)
Potassium: 4.1 mmol/L (ref 3.5–5.2)
Sodium: 142 mmol/L (ref 134–144)
Total Protein: 6.3 g/dL (ref 6.0–8.5)
eGFR: 73 mL/min/{1.73_m2} (ref >59)

## 2021-10-28 LAB — LIPID PANEL
Chol/HDL Ratio: 3.5 ratio (ref 0.0–4.4)
Cholesterol, Total: 202 mg/dL — ABNORMAL HIGH (ref 100–199)
HDL: 58 mg/dL (ref >39)
LDL Chol Calc (NIH): 122 mg/dL — ABNORMAL HIGH (ref 0–99)
Triglycerides: 122 mg/dL (ref 0–149)
VLDL Cholesterol Cal: 22 mg/dL (ref 5–40)

## 2021-10-28 LAB — TSH: TSH: 3.18 u[IU]/mL (ref 0.450–4.500)

## 2021-12-22 ENCOUNTER — Other Ambulatory Visit: Payer: Self-pay | Admitting: General Surgery

## 2021-12-22 DIAGNOSIS — Z1231 Encounter for screening mammogram for malignant neoplasm of breast: Secondary | ICD-10-CM

## 2021-12-23 DIAGNOSIS — M7672 Peroneal tendinitis, left leg: Secondary | ICD-10-CM | POA: Diagnosis not present

## 2021-12-23 DIAGNOSIS — M722 Plantar fascial fibromatosis: Secondary | ICD-10-CM | POA: Diagnosis not present

## 2022-01-26 DIAGNOSIS — M722 Plantar fascial fibromatosis: Secondary | ICD-10-CM | POA: Diagnosis not present

## 2022-02-11 ENCOUNTER — Ambulatory Visit
Admission: RE | Admit: 2022-02-11 | Discharge: 2022-02-11 | Disposition: A | Discharge status: Home / Self Care | Payer: Medicare Other | Source: Ambulatory Visit | Attending: General Surgery | Admitting: General Surgery

## 2022-02-11 DIAGNOSIS — Z1231 Encounter for screening mammogram for malignant neoplasm of breast: Secondary | ICD-10-CM | POA: Insufficient documentation

## 2022-02-18 DIAGNOSIS — Z1231 Encounter for screening mammogram for malignant neoplasm of breast: Secondary | ICD-10-CM | POA: Diagnosis not present

## 2022-05-06 DIAGNOSIS — R350 Frequency of micturition: Secondary | ICD-10-CM | POA: Diagnosis not present

## 2022-05-06 DIAGNOSIS — S3992XA Unspecified injury of lower back, initial encounter: Secondary | ICD-10-CM | POA: Diagnosis not present

## 2022-05-06 DIAGNOSIS — M5416 Radiculopathy, lumbar region: Secondary | ICD-10-CM | POA: Diagnosis not present

## 2022-06-02 DIAGNOSIS — M4325 Fusion of spine, thoracolumbar region: Secondary | ICD-10-CM | POA: Diagnosis not present

## 2022-06-02 DIAGNOSIS — S39012A Strain of muscle, fascia and tendon of lower back, initial encounter: Secondary | ICD-10-CM | POA: Diagnosis not present

## 2022-06-02 DIAGNOSIS — M5416 Radiculopathy, lumbar region: Secondary | ICD-10-CM | POA: Diagnosis not present

## 2022-07-17 ENCOUNTER — Other Ambulatory Visit: Payer: Self-pay | Admitting: Physician Assistant

## 2022-07-17 DIAGNOSIS — E7800 Pure hypercholesterolemia, unspecified: Secondary | ICD-10-CM

## 2022-07-18 NOTE — Telephone Encounter (Signed)
Requested Prescriptions  Pending Prescriptions Disp Refills  . lovastatin (MEVACOR) 40 MG tablet [Pharmacy Med Name: LOVASTATIN '40MG'$  TABLETS] 90 tablet 0    Sig: TAKE 1 TABLET(40 MG) BY MOUTH AT BEDTIME     Cardiovascular:  Antilipid - Statins 2 Failed - 07/17/2022  8:38 AM      Failed - Lipid Panel in normal range within the last 12 months    Cholesterol, Total  Date Value Ref Range Status  10/27/2021 202 (H) 100 - 199 mg/dL Final   LDL Cholesterol (Calc)  Date Value Ref Range Status  10/20/2017 108 (H) mg/dL (calc) Final    Comment:    Reference range: <100 . Desirable range <100 mg/dL for primary prevention;   <70 mg/dL for patients with CHD or diabetic patients  with > or = 2 CHD risk factors. Marland Kitchen LDL-C is now calculated using the Martin-Hopkins  calculation, which is a validated novel method providing  better accuracy than the Friedewald equation in the  estimation of LDL-C.  Cresenciano Genre et al. Annamaria Helling. 1017;510(25): 2061-2068  (http://education.[facility].com/faq/FAQ164)    LDL Chol Calc (NIH)  Date Value Ref Range Status  10/27/2021 122 (H) 0 - 99 mg/dL Final   HDL  Date Value Ref Range Status  10/27/2021 58 >39 mg/dL Final   Triglycerides  Date Value Ref Range Status  10/27/2021 122 0 - 149 mg/dL Final         Passed - Cr in normal range and within 360 days    Creat  Date Value Ref Range Status  10/20/2017 0.87 0.50 - 0.99 mg/dL Final    Comment:    For patients >21 years of age, the reference limit for Creatinine is approximately 13% higher for people identified as African-American. .    Creatinine, Ser  Date Value Ref Range Status  10/27/2021 0.86 0.57 - 1.00 mg/dL Final         Passed - Patient is not pregnant      Passed - Valid encounter within last 12 months    Recent Outpatient Visits          8 months ago Annual physical exam   [employer], Dani Gobble, PA-C   1 year ago Skin lesion of back   [place]  [facility], Avanti, MD   1 year ago Annual physical exam   [place], Clearnce Sorrel, [place]   2 years ago Flank pain   [place], Clearnce Sorrel, [place]   2 years ago Annual physical exam   [employer] [facility], Vickki Muff, [place]

## 2022-08-05 ENCOUNTER — Telehealth: Payer: Self-pay | Admitting: *Deleted

## 2022-08-05 NOTE — Patient Outreach (Signed)
  Care Coordination   Initial Visit Note   08/05/2022 Name: AUDRIE KURI MRN: [id number] DOB: Jul 08, 1951  TIERNAN MILLIKIN is a 71 y.o. year old female who sees Thedore Mins, Ria Comment, [place] for primary care. I spoke with  Gloris Ham by phone today. Could you please call me back.   Follow up plan: Follow up call scheduled for Johny Shock [phone] 2:30    Encounter Outcome:  Pt. Request to Call [facility] [phone]

## 2022-08-10 ENCOUNTER — Ambulatory Visit: Payer: Self-pay | Admitting: *Deleted

## 2022-08-10 NOTE — Patient Outreach (Signed)
  Care Coordination   08/10/2022 Name: Monica Delacruz MRN: [id number] DOB: 05/09/1951   Care Coordination Outreach Attempts:  An unsuccessful telephone outreach was attempted today to offer the patient information about available care coordination services as a benefit of their health plan.   Follow Up Plan:  Additional outreach attempts will be made to offer the patient care coordination information and services.   Encounter Outcome:  No Answer  Care Coordination Interventions Activated:  Yes   Care Coordination Interventions:  No, not indicated    [facility] [phone]

## 2022-08-11 DIAGNOSIS — M7751 Other enthesopathy of right foot: Secondary | ICD-10-CM | POA: Diagnosis not present

## 2022-08-11 DIAGNOSIS — M79671 Pain in right foot: Secondary | ICD-10-CM | POA: Diagnosis not present

## 2022-08-11 DIAGNOSIS — M722 Plantar fascial fibromatosis: Secondary | ICD-10-CM | POA: Diagnosis not present

## 2022-10-18 ENCOUNTER — Other Ambulatory Visit: Payer: Self-pay | Admitting: Physician Assistant

## 2022-10-18 DIAGNOSIS — E7800 Pure hypercholesterolemia, unspecified: Secondary | ICD-10-CM

## 2022-10-25 ENCOUNTER — Encounter: Payer: Medicare Other | Admitting: Physician Assistant

## 2022-10-28 ENCOUNTER — Ambulatory Visit (INDEPENDENT_AMBULATORY_CARE_PROVIDER_SITE_OTHER): Payer: Medicare Other | Admitting: Physician Assistant

## 2022-10-28 ENCOUNTER — Encounter: Payer: Self-pay | Admitting: Physician Assistant

## 2022-10-28 VITALS — BP 152/74 | HR 79 | Wt 172.0 lb

## 2022-10-28 DIAGNOSIS — Z1211 Encounter for screening for malignant neoplasm of colon: Secondary | ICD-10-CM

## 2022-10-28 DIAGNOSIS — R030 Elevated blood-pressure reading, without diagnosis of hypertension: Secondary | ICD-10-CM | POA: Insufficient documentation

## 2022-10-28 DIAGNOSIS — Z23 Encounter for immunization: Secondary | ICD-10-CM | POA: Diagnosis not present

## 2022-10-28 DIAGNOSIS — Z0000 Encounter for general adult medical examination without abnormal findings: Secondary | ICD-10-CM | POA: Diagnosis not present

## 2022-10-28 DIAGNOSIS — E7800 Pure hypercholesterolemia, unspecified: Secondary | ICD-10-CM

## 2022-10-28 NOTE — Assessment & Plan Note (Signed)
Elevated in office today Will monitor f/u 6 mo

## 2022-10-28 NOTE — Progress Notes (Signed)
I,Sha'taria Tyson,acting as a Education administrator for [employer], PA-C.,have documented all relevant documentation on the behalf of Mikey Kirschner, PA-C,as directed by  Mikey Kirschner, PA-C while in the presence of Mikey Kirschner, PA-C.   Annual Wellness Visit     Patient: Monica Delacruz, Female    DOB: 12-27-1950, 72 y.o.   MRN: [id number] Visit Date: 10/28/2022  Today's Provider: Mikey Kirschner, PA-C   Cc. Awv, cpe   Subjective    Monica Delacruz is a 72 y.o. female who presents today for her Annual Wellness Visit. She reports consuming a general diet.  The patient reports going walking 3 time a week for at least 45 minutes to a hour.  She generally feels well. She reports sleeping well. She does not have additional problems to discuss today.   She has concerns today over needing a pap smear. Reports total hysterectomy with intact ovaries in 1995.  Medications: Outpatient Medications Prior to Visit  Medication Sig   butalbital-acetaminophen-caffeine (FIORICET) 50-325-40 MG tablet Take 1 tablet by mouth every 6 (six) hours as needed for migraine.   cetirizine (ZYRTEC) 10 MG tablet Take 10 mg by mouth daily.   fluticasone (FLONASE) 50 MCG/ACT nasal spray Place 1 spray into both nostrils daily as needed for allergies or rhinitis.   ibuprofen (ADVIL) 200 MG tablet Take 600 mg by mouth every 6 (six) hours as needed for moderate pain.   lovastatin (MEVACOR) 40 MG tablet TAKE 1 TABLET(40 MG) BY MOUTH AT BEDTIME   VITAMIN D PO Take 3 tablets by mouth daily. 600 units per   No facility-administered medications prior to visit.    Allergies  Allergen Reactions   Tramadol Diarrhea, Nausea And Vomiting and Nausea Only    Other reaction(s): Vomiting   Aspirin Other (See Comments)    Burning. Burns stomach Other reaction(s): Abdominal Pain, Other (See Comments) Burning.   Morphine And Related Itching   Ofloxacin Nausea Only   Oxycodone Hcl     Weird dreams   Simvastatin     GI upset    Sulfa Antibiotics Hives, Itching and Swelling   Adhesive [Tape] Other (See Comments)    Blisters skin    Patient Care Team: Mikey Kirschner, PA-C as PCP - General (Physician Assistant) System, Provider Not In  Review of Systems  All other systems reviewed and are negative.  Last CBC Lab Results  Component Value Date   WBC 4.9 10/27/2021   HGB 14.0 10/27/2021   HCT 41.8 10/27/2021   MCV 88 10/27/2021   MCH 29.6 10/27/2021   RDW 12.2 10/27/2021   PLT 269 38/25/0539   Last metabolic panel Lab Results  Component Value Date   GLUCOSE 97 10/27/2021   NA 142 10/27/2021   K 4.1 10/27/2021   CL 107 (H) 10/27/2021   CO2 21 10/27/2021   BUN 18 10/27/2021   CREATININE 0.86 10/27/2021   EGFR 73 10/27/2021   CALCIUM 8.8 10/27/2021   PROT 6.3 10/27/2021   ALBUMIN 4.4 10/27/2021   LABGLOB 1.9 10/27/2021   AGRATIO 2.3 (H) 10/27/2021   BILITOT 0.4 10/27/2021   ALKPHOS 99 10/27/2021   AST 26 10/27/2021   ALT 34 (H) 10/27/2021   ANIONGAP 5 08/31/2016   Last lipids Lab Results  Component Value Date   CHOL 202 (H) 10/27/2021   HDL 58 10/27/2021   LDLCALC 122 (H) 10/27/2021   TRIG 122 10/27/2021   CHOLHDL 3.5 10/27/2021   Last hemoglobin A1c Lab Results  Component Value  Date   HGBA1C 5.7 (H) 08/28/2016   Last thyroid functions Lab Results  Component Value Date   TSH 3.180 10/27/2021   Last vitamin D Lab Results  Component Value Date   VD25OH 59.7 10/19/2020        Objective    Vitals: BP (!) 152/74 (BP Location: Left Arm, Patient Position: Sitting, Cuff Size: Normal)   Pulse 79   Wt 172 lb (78 kg)   SpO2 100%   BMI 28.62 kg/m  BP Readings from Last 3 Encounters:  10/28/22 (!) 152/74  10/22/21 (!) 134/91  09/15/21 (!) 148/90   Wt Readings from Last 3 Encounters:  10/28/22 172 lb (78 kg)  10/22/21 170 lb (77.1 kg)  09/15/21 173 lb (78.5 kg)    Physical Exam Constitutional:      General: She is awake.     Appearance: She is well-developed. She is not  ill-appearing.  HENT:     Head: Normocephalic.     Right Ear: Tympanic membrane normal.     Left Ear: Tympanic membrane normal.     Nose: Nose normal. No congestion or rhinorrhea.     Mouth/Throat:     Pharynx: No oropharyngeal exudate or posterior oropharyngeal erythema.  Eyes:     Conjunctiva/sclera: Conjunctivae normal.     Pupils: Pupils are equal, round, and reactive to light.  Neck:     Thyroid: No thyroid mass or thyromegaly.  Cardiovascular:     Rate and Rhythm: Normal rate and regular rhythm.     Heart sounds: Normal heart sounds.  Pulmonary:     Effort: Pulmonary effort is normal.     Breath sounds: Normal breath sounds.  Abdominal:     Palpations: Abdomen is soft.     Tenderness: There is no abdominal tenderness.  Musculoskeletal:     Right lower leg: No swelling. No edema.     Left lower leg: No swelling. No edema.  Lymphadenopathy:     Cervical: No cervical adenopathy.  Skin:    General: Skin is warm.  Neurological:     Mental Status: She is alert and oriented to person, place, and time.  Psychiatric:        Attention and Perception: Attention normal.        Mood and Affect: Mood normal.        Speech: Speech normal.        Behavior: Behavior normal. Behavior is cooperative.    Most recent functional status assessment:     No data to display         Most recent fall risk assessment:    10/22/2021    1:42 PM  [place] in the past year? 0  Number falls in past yr: 0  Injury with Fall? 0  Risk for fall due to : No Fall Risks  Follow up Falls evaluation completed    Most recent depression screenings:    10/22/2021    1:43 PM 09/15/2021   11:26 AM  PHQ 2/9 Scores  PHQ - 2 Score 0 0  PHQ- 9 Score 0    Most recent cognitive screening:     No data to display         Most recent Audit-C alcohol use screening    10/22/2021    1:42 PM  Alcohol Use Disorder Test (AUDIT)  1. How often do you have a drink containing alcohol? 1  2.  How many drinks containing alcohol do you have on  a typical day when you are drinking? 0  3. How often do you have six or more drinks on one occasion? 0  AUDIT-C Score 1   A score of 3 or more in women, and 4 or more in men indicates increased risk for alcohol abuse, EXCEPT if all of the points are from question 1   No results found for any visits on 10/28/22.  Assessment & Plan     Annual wellness visit done today including the all of the following: Reviewed patient's Family Medical History Reviewed and updated list of patient's medical providers Assessment of cognitive impairment was done Assessed patient's functional ability Established a written schedule for health screening [place] Completed and Reviewed  Exercise Activities and Dietary recommendations --balanced diet high in fiber and protein, low in sugars, carbs, fats. --physical activity/exercise 30 minutes 3-5 times a week    Immunization History  Administered Date(s) Administered   Fluad Quad(high Dose 65+) 08/20/2019, 10/14/2020, 09/15/2021, 10/28/2022   Influenza, High Dose Seasonal PF 10/10/2017   Influenza,inj,Quad PF,6+ Mos 07/14/2013, 08/23/2016   PFIZER(Purple Top)SARS-COV-2 Vaccination 05/21/2020, 06/11/2020   PNEUMOCOCCAL CONJUGATE-20 10/28/2022   Pneumococcal Conjugate-13 10/10/2017   Pneumococcal Polysaccharide-23 08/30/2016   Tdap 06/17/2011    Health Maintenance  Topic Date Due   Zoster Vaccines- Shingrix (1 of 2) Never done   COLONOSCOPY (Pts 45-84yr Insurance coverage will need to be confirmed)  08/04/2016   COVID-19 Vaccine (3 - Pfizer risk series) 07/09/2020   DTaP/Tdap/Td (2 - Td or Tdap) 06/16/2021   DEXA SCAN  11/16/2022   MAMMOGRAM  02/12/2023   Medicare Annual Wellness (AWV)  10/29/2023   Pneumonia Vaccine 72 Years old  Completed   INFLUENZA VACCINE  Completed   Hepatitis C Screening  Completed   HPV VACCINES  Aged Out    Discussed health benefits of physical  activity, and encouraged her to engage in regular exercise appropriate for her age and condition.    Problem List Items Addressed This Visit       Other   Hypercholesterolemia without hypertriglyceridemia    Managed with lovastatin 40 mg  Will repeat fasting lipids cmp The 10-year ASCVD risk score (Arnett DK, et al., 2019) is: 12.6%       Relevant Orders   Comprehensive metabolic panel   Lipid Panel With LDL/HDL Ratio   CBC w/Diff/Platelet   Elevated blood pressure reading in office without diagnosis of hypertension    Elevated in office today Will monitor f/u 6 mo       Other Visit Diagnoses     Encounter for Medicare annual wellness exam    -  Primary   Annual physical exam       Relevant Orders   CBC w/Diff/Platelet   Colon cancer screening       Relevant Orders   Ambulatory referral to Gastroenterology   Need for pneumococcal 20-valent conjugate vaccination       Relevant Orders   Pneumococcal conjugate vaccine 20-valent (Completed)   Need for immunization against influenza       Relevant Orders   Flu Vaccine QUAD High Dose(Fluad) (Completed)      Advised patient that if she surgically lacks a uterus and  cervix she does not need cervical cancer screening.  Return in about 6 months (around 04/28/2023) for hypertension.    I, LMikey Kirschner PA-C have reviewed all documentation for this visit. The documentation on  10/28/22 for the exam, diagnosis, procedures, and orders are all accurate  and complete.  Mikey Kirschner, PA-C [facility] [address] [place], [place], [address] Office: [phone] Fax: [place]

## 2022-10-28 NOTE — Assessment & Plan Note (Signed)
Managed with lovastatin 40 mg  Will repeat fasting lipids cmp The 10-year ASCVD risk score (Arnett DK, et al., 2019) is: 12.6%

## 2022-10-31 ENCOUNTER — Telehealth: Payer: Self-pay

## 2022-10-31 ENCOUNTER — Other Ambulatory Visit: Payer: Self-pay

## 2022-10-31 DIAGNOSIS — Z1211 Encounter for screening for malignant neoplasm of colon: Secondary | ICD-10-CM

## 2022-10-31 MED ORDER — PEG 3350-KCL-NA BICARB-NACL 420 G PO SOLR: 4000.0000 mL | Freq: Once | ORAL | 0 refills | Status: AC

## 2022-10-31 NOTE — Telephone Encounter (Signed)
Gastroenterology Pre-Procedure Review  Request Date: 11/15/22 Requesting Physician: Dr. Vicente Males  PATIENT REVIEW QUESTIONS: The patient responded to the following health history questions as indicated:    1. Are you having any GI issues? no 2. Do you have a personal history of Polyps?  No 3. Do you have a family history of Colon Cancer or Polyps? no 4. Diabetes Mellitus? no 5. Joint replacements in the past 12 months?no 6. Major health problems in the past 3 months?no 7. Any artificial heart valves, MVP, or defibrillator?no    MEDICATIONS & ALLERGIES:    Patient reports the following regarding taking any anticoagulation/antiplatelet therapy:   Plavix, Coumadin, Eliquis, Xarelto, Lovenox, Pradaxa, Brilinta, or Effient? no Aspirin? no  Patient confirms/reports the following medications:  Current Outpatient Medications  Medication Sig Dispense Refill   butalbital-acetaminophen-caffeine (FIORICET) 50-325-40 MG tablet Take 1 tablet by mouth every 6 (six) hours as needed for migraine. 14 tablet 0   cetirizine (ZYRTEC) 10 MG tablet Take 10 mg by mouth daily.     fluticasone (FLONASE) 50 MCG/ACT nasal spray Place 1 spray into both nostrils daily as needed for allergies or rhinitis.     ibuprofen (ADVIL) 200 MG tablet Take 600 mg by mouth every 6 (six) hours as needed for moderate pain.     lovastatin (MEVACOR) 40 MG tablet TAKE 1 TABLET(40 MG) BY MOUTH AT BEDTIME 90 tablet 0   VITAMIN D PO Take 3 tablets by mouth daily. 600 units per     No current facility-administered medications for this visit.    Patient confirms/reports the following allergies:  Allergies  Allergen Reactions   Tramadol Diarrhea, Nausea And Vomiting and Nausea Only    Other reaction(s): Vomiting   Aspirin Other (See Comments)    Burning. Burns stomach Other reaction(s): Abdominal Pain, Other (See Comments) Burning.   Morphine And Related Itching   Ofloxacin Nausea Only   Oxycodone Hcl     Weird dreams    Simvastatin     GI upset   Sulfa Antibiotics Hives, Itching and Swelling   Adhesive [Tape] Other (See Comments)    Blisters skin    No orders of the defined types were placed in this encounter.   AUTHORIZATION INFORMATION Primary Insurance: 1D#: Group #:  Secondary Insurance: 1D#: Group #:  SCHEDULE INFORMATION: Date:  Time: Location:

## 2022-11-01 DIAGNOSIS — Z0000 Encounter for general adult medical examination without abnormal findings: Secondary | ICD-10-CM | POA: Diagnosis not present

## 2022-11-01 DIAGNOSIS — E7800 Pure hypercholesterolemia, unspecified: Secondary | ICD-10-CM | POA: Diagnosis not present

## 2022-11-02 ENCOUNTER — Other Ambulatory Visit: Payer: Self-pay | Admitting: Physician Assistant

## 2022-11-02 DIAGNOSIS — E7800 Pure hypercholesterolemia, unspecified: Secondary | ICD-10-CM

## 2022-11-02 LAB — CBC WITH DIFFERENTIAL/PLATELET
Basophils Absolute: 0.1 10*3/uL (ref 0.0–0.2)
Basos: 1 %
EOS (ABSOLUTE): 0.1 10*3/uL (ref 0.0–0.4)
Eos: 2 %
Hematocrit: 41.3 % (ref 34.0–46.6)
Hemoglobin: 13.7 g/dL (ref 11.1–15.9)
Immature Grans (Abs): 0 10*3/uL (ref 0.0–0.1)
Immature Granulocytes: 0 %
Lymphocytes Absolute: 1.9 10*3/uL (ref 0.7–3.1)
Lymphs: 40 %
MCH: 29.6 pg (ref 26.6–33.0)
MCHC: 33.2 g/dL (ref 31.5–35.7)
MCV: 89 fL (ref 79–97)
Monocytes Absolute: 0.5 10*3/uL (ref 0.1–0.9)
Monocytes: 10 %
Neutrophils Absolute: 2.1 10*3/uL (ref 1.4–7.0)
Neutrophils: 47 %
Platelets: 257 10*3/uL (ref 150–450)
RBC: 4.63 x10E6/uL (ref 3.77–5.28)
RDW: 12.5 % (ref 11.7–15.4)
WBC: 4.6 10*3/uL (ref 3.4–10.8)

## 2022-11-02 LAB — LIPID PANEL WITH LDL/HDL RATIO
Cholesterol, Total: 203 mg/dL — ABNORMAL HIGH (ref 100–199)
HDL: 69 mg/dL (ref >39)
LDL Chol Calc (NIH): 120 mg/dL — ABNORMAL HIGH (ref 0–99)
LDL/HDL Ratio: 1.7 ratio (ref 0.0–3.2)
Triglycerides: 79 mg/dL (ref 0–149)
VLDL Cholesterol Cal: 14 mg/dL (ref 5–40)

## 2022-11-02 LAB — COMPREHENSIVE METABOLIC PANEL
ALT: 24 IU/L (ref 0–32)
AST: 20 IU/L (ref 0–40)
Albumin/Globulin Ratio: 2 (ref 1.2–2.2)
Albumin: 4.3 g/dL (ref 3.9–4.9)
Alkaline Phosphatase: 88 IU/L (ref 44–121)
BUN/Creatinine Ratio: 19 (ref 12–28)
BUN: 15 mg/dL (ref 8–27)
Bilirubin Total: 0.5 mg/dL (ref 0.0–1.2)
CO2: 20 mmol/L (ref 20–29)
Calcium: 9.3 mg/dL (ref 8.7–10.3)
Chloride: 105 mmol/L (ref 96–106)
Creatinine, Ser: 0.8 mg/dL (ref 0.57–1.00)
Globulin, Total: 2.1 g/dL (ref 1.5–4.5)
Glucose: 91 mg/dL (ref 70–99)
Potassium: 4.2 mmol/L (ref 3.5–5.2)
Sodium: 140 mmol/L (ref 134–144)
Total Protein: 6.4 g/dL (ref 6.0–8.5)
eGFR: 79 mL/min/{1.73_m2} (ref >59)

## 2022-11-02 MED ORDER — ROSUVASTATIN CALCIUM 20 MG PO TABS: 20.0000 mg | ORAL_TABLET | Freq: Every day | ORAL | 3 refills | Status: DC

## 2022-11-14 ENCOUNTER — Encounter: Payer: Self-pay | Admitting: Gastroenterology

## 2022-11-14 ENCOUNTER — Telehealth: Payer: Self-pay | Admitting: *Deleted

## 2022-11-14 NOTE — Patient Outreach (Signed)
  Care Coordination   Initial Visit Note   11/14/2022 Name: Monica Delacruz MRN: [id number] DOB: 04-Aug-1951  Monica Delacruz is a 72 y.o. year old female who sees Thedore Mins, Ria Comment, [place] for primary care. I spoke with  Monica Delacruz by phone today.  What matters to the patients health and wellness today?  Patient report she is doing well, does not have any current urgent concerns.  State she is only on one medication for cholesterol and has routine colonoscopy tomorrow.  Does not feel follow up is warranted at this time but will call with questions.     SDOH assessments and interventions completed:  Yes  SDOH Interventions Today    Flowsheet Row Most Recent Value  SDOH Interventions   Food Insecurity Interventions Intervention Not Indicated  Transportation Interventions Intervention Not Indicated        Care Coordination Interventions:  Yes, provided   Follow up plan: No further intervention required.   Encounter Outcome:  Pt. Visit Completed   Valente David, RN, MSN, [facility] Care Management Coordinator [phone]

## 2022-11-15 ENCOUNTER — Encounter
Admission: RE | Disposition: A | Discharge status: Home / Self Care | Payer: Self-pay | Source: Home / Self Care | Attending: Gastroenterology

## 2022-11-15 ENCOUNTER — Ambulatory Visit: Payer: Medicare Other | Admitting: Registered Nurse

## 2022-11-15 ENCOUNTER — Encounter: Payer: Self-pay | Admitting: Gastroenterology

## 2022-11-15 ENCOUNTER — Ambulatory Visit
Admission: RE | Admit: 2022-11-15 | Discharge: 2022-11-15 | Disposition: A | Discharge status: Home / Self Care | Payer: Medicare Other | Attending: Gastroenterology | Admitting: Gastroenterology

## 2022-11-15 DIAGNOSIS — D125 Benign neoplasm of sigmoid colon: Secondary | ICD-10-CM | POA: Insufficient documentation

## 2022-11-15 DIAGNOSIS — K5790 Diverticulosis of intestine, part unspecified, without perforation or abscess without bleeding: Secondary | ICD-10-CM | POA: Diagnosis not present

## 2022-11-15 DIAGNOSIS — K5730 Diverticulosis of large intestine without perforation or abscess without bleeding: Secondary | ICD-10-CM | POA: Insufficient documentation

## 2022-11-15 DIAGNOSIS — D120 Benign neoplasm of cecum: Secondary | ICD-10-CM | POA: Insufficient documentation

## 2022-11-15 DIAGNOSIS — Z1211 Encounter for screening for malignant neoplasm of colon: Secondary | ICD-10-CM | POA: Diagnosis not present

## 2022-11-15 DIAGNOSIS — D124 Benign neoplasm of descending colon: Secondary | ICD-10-CM | POA: Insufficient documentation

## 2022-11-15 DIAGNOSIS — K635 Polyp of colon: Secondary | ICD-10-CM | POA: Diagnosis not present

## 2022-11-15 DIAGNOSIS — D126 Benign neoplasm of colon, unspecified: Secondary | ICD-10-CM | POA: Diagnosis not present

## 2022-11-15 DIAGNOSIS — E7800 Pure hypercholesterolemia, unspecified: Secondary | ICD-10-CM | POA: Diagnosis not present

## 2022-11-15 HISTORY — PX: COLONOSCOPY WITH PROPOFOL: SHX5780

## 2022-11-15 SURGERY — COLONOSCOPY WITH PROPOFOL
Anesthesia: General

## 2022-11-15 MED ORDER — SODIUM CHLORIDE 0.9 % IV SOLN: INTRAVENOUS | Status: DC

## 2022-11-15 MED ORDER — LIDOCAINE HCL (CARDIAC) PF 100 MG/5ML IV SOSY
PREFILLED_SYRINGE | INTRAVENOUS | Status: DC | PRN
  Administered 2022-11-15: 60 mg via INTRAVENOUS

## 2022-11-15 MED ORDER — PROPOFOL 10 MG/ML IV BOLUS
INTRAVENOUS | Status: DC | PRN
  Administered 2022-11-15: 70 mg via INTRAVENOUS

## 2022-11-15 MED ORDER — PROPOFOL 500 MG/50ML IV EMUL
INTRAVENOUS | Status: DC | PRN
  Administered 2022-11-15: 140 ug/kg/min via INTRAVENOUS

## 2022-11-15 NOTE — H&P (Signed)
Jonathon Bellows, MD [address], [place], [place], [place], [address] [address], [place], [place], [place], [address] Phone: [phone]  Fax: [phone]  Primary Care Physician:  Mikey Kirschner, PA-C   Pre-Procedure History & Physical: HPI:  Monica Delacruz is a 72 y.o. female is here for an colonoscopy.   Past Medical History:  Diagnosis Date   Back pain    lumbar fracture   Diverticulosis    Headache    Migraine   History of bronchitis    2014   History of colon polyps    History of kidney stones    History of migraine    last one about 2wks ago   Hyperlipidemia    takes Lovasatin daily   Seasonal allergies    takes Claritin daily and Flonase daily    Past Surgical History:  Procedure Laterality Date   ABDOMINAL HYSTERECTOMY  1985   please note pt reported today 10/28/22 that it was in [place] Left 02/12/2020   Stereo Bx, X-clip, neg   BREAST BIOPSY WITH RADIO FREQUENCY LOCALIZER Left 03/04/2020   Procedure: BREAST BIOPSY WITH RADIO FREQUENCY LOCALIZER;  Surgeon: Herbert Pun, MD;  Location: [facility];  Service: General;  Laterality: Left;   COLONOSCOPY     CYSTOSCOPY     CYSTOSCOPY W/ URETERAL STENT PLACEMENT Right 10/03/2016   Procedure: CYSTOSCOPY WITH STENT REPLACEMENT;  Surgeon: Hollice Espy, MD;  Location: [facility];  Service: Urology;  Laterality: Right;   CYSTOSCOPY/RETROGRADE/URETEROSCOPY Right 08/29/2016   Procedure: CYSTOSCOPY/RETROGRADE/stent placement;  Surgeon: Alexis Frock, MD;  Location: [facility];  Service: Urology;  Laterality: Right;   EXCISION OF TONGUE LESION Left 07/15/2021   Procedure: EXCISION OF MUCOSA LIP LESION;  Surgeon: Margaretha Sheffield, MD;  Location: [facility];  Service: ENT;  Laterality: Left;   FOOT SURGERY     bilateral   JOINT REPLACEMENT Left 2009   knee   LAMINECTOMY WITH POSTERIOR LATERAL ARTHRODESIS LEVEL 4 N/A 07/12/2013   Procedure: Thoracic Ten Through Lumbar  Three posterior lateral fusion with instrumentation;  Surgeon: Winfield Cunas, MD;  Location: [facility];  Service: Neurosurgery;  Laterality: N/A;  Thoracic Ten Through Lumbar Three posterior lateral fusion with instrumentation   LITHOTRIPSY     x 2   NASAL SINUS SURGERY     x 2   URETEROSCOPY WITH HOLMIUM LASER LITHOTRIPSY Right 10/03/2016   Procedure: URETEROSCOPY WITH HOLMIUM LASER LITHOTRIPSY;  Surgeon: Hollice Espy, MD;  Location: [facility];  Service: Urology;  Laterality: Right;    Prior to Admission medications   Medication Sig Start Date End Date Taking? Authorizing Provider  VITAMIN D PO Take 3 tablets by mouth daily. 600 units per   Yes [provider]  butalbital-acetaminophen-caffeine (FIORICET) 50-325-40 MG tablet Take 1 tablet by mouth every 6 (six) hours as needed for migraine. 10/22/21   Mecum, Erin E, PA-C  cetirizine (ZYRTEC) 10 MG tablet Take 10 mg by mouth daily.    [provider]  fluticasone (FLONASE) 50 MCG/ACT nasal spray Place 1 spray into both nostrils daily as needed for allergies or rhinitis.    [provider]  ibuprofen (ADVIL) 200 MG tablet Take 600 mg by mouth every 6 (six) hours as needed for moderate pain.    [provider]  rosuvastatin (CRESTOR) 20 MG tablet Take 1 tablet (20 mg total) by mouth daily. 11/02/22   Mikey Kirschner, PA-C  Allergies as of 11/01/2022 - Review Complete 10/28/2022  Allergen Reaction Noted   Tramadol Diarrhea, Nausea And Vomiting, and Nausea Only 08/29/2014   Aspirin Other (See Comments) 07/09/2013   Morphine and related Itching 02/20/2020   Ofloxacin Nausea Only 10/02/2009   Oxycodone hcl     Simvastatin     Sulfa antibiotics Hives, Itching, and Swelling 07/09/2013   Adhesive [tape] Other (See Comments) 07/09/2013    Family History  Problem Relation Age of Onset   Hematuria Mother    Diabetes type II Mother    Hypertension Father    Stomach cancer Father    Breast cancer  Maternal Grandmother     Social History   Socioeconomic History   Marital status: Married    Spouse name: Not on file   Number of children: 2   Years of education: Not on file   Highest education level: Some college, no degree  Occupational History   Occupation: retired  Tobacco Use   Smoking status: Never   Smokeless tobacco: Never  Vaping Use   Vaping Use: Never used  Substance and Sexual Activity   Alcohol use: Yes    Comment: wine rarely   Drug use: No   Sexual activity: Yes    Birth control/protection: Surgical  Other Topics Concern   Not on file  Social History Narrative   Not on file   Social Determinants of Health   Financial Resource Strain: Low Risk  (09/15/2021)   Overall Financial Resource Strain (CARDIA)    Difficulty of Paying Living Expenses: Not hard at all  Food Insecurity: No Food Insecurity (11/14/2022)   Hunger Vital Sign    Worried About Running Out of Food in the Last Year: Never true    [place] in the Last Year: Never true  Transportation Needs: No Transportation Needs (11/14/2022)   PRAPARE - Hydrologist (Medical): No    Lack of Transportation (Non-Medical): No  Physical Activity: Insufficiently Active (09/15/2021)   Exercise Vital Sign    Days of Exercise per Week: 3 days    Minutes of Exercise per Session: 40 min  Stress: No Stress Concern Present (09/15/2021)   [place]    Feeling of Stress : Not at all  Social Connections: Moderately Integrated (09/15/2021)   Social Connection and Isolation Panel [NHANES]    Frequency of Communication with Friends and Family: More than three times a week    Frequency of Social Gatherings with Friends and Family: More than three times a week    Attends Religious Services: More than 4 times per year    Active Member of [employer] or Organizations: No    Attends Archivist Meetings: Never     Marital Status: Married  Human resources officer Violence: Not At Risk (09/15/2021)   Humiliation, Afraid, Rape, and Kick questionnaire    Fear of Current or Ex-Partner: No    Emotionally Abused: No    Physically Abused: No    Sexually Abused: No    Review of Systems: See HPI, otherwise negative ROS  Physical Exam: BP (!) 147/83   Pulse 77   Temp (!) 97.4 F (36.3 C) (Temporal)   Resp 16   Ht '5\' 5"'$  (1.651 m)   Wt 78.5 kg   SpO2 99%   BMI 28.79 kg/m  General:   Alert,  pleasant and cooperative in NAD Head:  Normocephalic and atraumatic. Neck:  Supple;  no masses or thyromegaly. Lungs:  Clear throughout to auscultation, normal respiratory effort.    Heart:  +S1, +S2, Regular rate and rhythm, No edema. Abdomen:  Soft, nontender and nondistended. Normal bowel sounds, without guarding, and without rebound.   Neurologic:  Alert and  oriented x4;  grossly normal neurologically.  Impression/Plan: Monica Delacruz is here for an colonoscopy to be performed for Screening colonoscopy average risk   Risks, benefits, limitations, and alternatives regarding  colonoscopy have been reviewed with the patient.  Questions have been answered.  All parties agreeable.   Jonathon Bellows, MD  11/15/2022, 10:02 AM

## 2022-11-15 NOTE — Anesthesia Postprocedure Evaluation (Signed)
Anesthesia Post Note  Patient: Monica Delacruz  Procedure(s) Performed: COLONOSCOPY WITH PROPOFOL  Patient location during evaluation: Endoscopy Anesthesia Type: General Level of consciousness: awake and alert Pain management: pain level controlled Vital Signs Assessment: post-procedure vital signs reviewed and stable Respiratory status: spontaneous breathing, nonlabored ventilation, respiratory function stable and patient connected to nasal cannula oxygen Cardiovascular status: blood pressure returned to baseline and stable Postop Assessment: no apparent nausea or vomiting Anesthetic complications: no   No notable events documented.   Last Vitals:  Vitals:   11/15/22 0919 11/15/22 1030  BP: (!) 147/83 92/63  Pulse: 77 66  Resp: 16 16  Temp: (!) 36.3 C   SpO2: 99% 96%    Last Pain:  Vitals:   11/15/22 1030  TempSrc:   PainSc: 0-No pain                 Arita Miss

## 2022-11-15 NOTE — Transfer of Care (Signed)
Immediate Anesthesia Transfer of Care Note  Patient: Monica Delacruz  Procedure(s) Performed: COLONOSCOPY WITH PROPOFOL  Patient Location: PACU  Anesthesia Type:General  Level of Consciousness: awake, alert , and oriented  Airway & Oxygen Therapy: Patient Spontanous Breathing  Post-op Assessment: Report given to RN and Post -op Vital signs reviewed and stable  Post vital signs: Reviewed and stable  Last Vitals:  Vitals Value Taken Time  BP 92/63 11/15/22 1030  Temp    Pulse 65 11/15/22 1030  Resp 18 11/15/22 1030  SpO2 96 % 11/15/22 1030  Vitals shown include unvalidated device data.  Last Pain:  Vitals:   11/15/22 1030  TempSrc:   PainSc: 0-No pain         Complications: No notable events documented.

## 2022-11-15 NOTE — Op Note (Signed)
[facility] Gastroenterology Patient Name: Monica Delacruz Procedure Date: 11/15/2022 10:04 AM MRN: [id number] Account #: [id number] Date of Birth: [birth year] Admit Type: Outpatient Age: 72 Room: [facility] ENDO ROOM 2 Gender: Female Note Status: Finalized Instrument Name: Park Meo [id number] Procedure:             Colonoscopy Indications:           Screening for colorectal malignant neoplasm Providers:             Jonathon Bellows MD, MD Referring MD:          No Local Md, MD (Referring MD) Medicines:             Monitored Anesthesia Care Complications:         No immediate complications. Procedure:             Pre-Anesthesia Assessment:                        - Prior to the procedure, a History and Physical was                         performed, and patient medications, allergies and                         sensitivities were reviewed. The patient's tolerance                         of previous anesthesia was reviewed.                        - The risks and benefits of the procedure and the                         sedation options and risks were discussed with the                         patient. All questions were answered and informed                         consent was obtained.                        - ASA Grade Assessment: II - A patient with mild                         systemic disease.                        After obtaining informed consent, the colonoscope was                         passed under direct vision. Throughout the procedure,                         the patient's blood pressure, pulse, and oxygen                         saturations were monitored continuously. The                         Colonoscope was  introduced through the anus and                         advanced to the the cecum, identified by the                         appendiceal orifice. The colonoscopy was performed                         with ease. The patient tolerated the procedure well.                          The quality of the bowel preparation was excellent.                         The ileocecal valve, appendiceal orifice, and rectum                         were photographed. Findings:      The perianal and digital rectal examinations were normal.      A 5 mm polyp was found in the cecum. The polyp was sessile. The polyp       was removed with a cold snare. Resection and retrieval were complete.      Two sessile polyps were found in the sigmoid colon and descending colon.       The polyps were 3 to 4 mm in size. These polyps were removed with a       jumbo cold forceps. Resection and retrieval were complete.      Multiple small-mouthed diverticula were found in the sigmoid colon.      The exam was otherwise without abnormality on direct and retroflexion       views. Impression:            - One 5 mm polyp in the cecum, removed with a cold                         snare. Resected and retrieved.                        - Two 3 to 4 mm polyps in the sigmoid colon and in the                         descending colon, removed with a jumbo cold forceps.                         Resected and retrieved.                        - Diverticulosis in the sigmoid colon.                        - The examination was otherwise normal on direct and                         retroflexion views. Recommendation:        - Discharge patient to home (with escort).                        -  Resume previous diet.                        - Continue present medications.                        - Await pathology results.                        - Repeat colonoscopy for surveillance based on                         pathology results. Procedure Code(s):     --- Professional ---                        [phone], Colonoscopy, flexible; with removal of                         tumor(s), polyp(s), or other lesion(s) by snare                         technique                        45380, 9, Colonoscopy, flexible;  with biopsy, single                         or multiple Diagnosis Code(s):     --- Professional ---                        Z12.11, Encounter for screening for malignant neoplasm                         of colon                        D12.5, Benign neoplasm of sigmoid colon                        D12.0, Benign neoplasm of cecum                        D12.4, Benign neoplasm of descending colon                        K57.30, Diverticulosis of large intestine without                         perforation or abscess without bleeding CPT copyright 2022 American Medical Association. All rights reserved. The codes documented in this report are preliminary and upon coder review may  be revised to meet current compliance requirements. Jonathon Bellows, MD Jonathon Bellows MD, MD 11/15/2022 10:29:42 AM This report has been signed electronically. Number of Addenda: 0 Note Initiated On: 11/15/2022 10:04 AM Scope Withdrawal Time: 0 hours 15 minutes 52 seconds  Total Procedure Duration: 0 hours 18 minutes 26 seconds  Estimated Blood Loss:  Estimated blood loss: none.      [facility]

## 2022-11-15 NOTE — Anesthesia Preprocedure Evaluation (Signed)
Anesthesia Evaluation  Patient identified by MRN, date of birth, ID band Patient awake    Reviewed: Allergy & Precautions, NPO status , Patient's Chart, lab work & pertinent test results  History of Anesthesia Complications Negative for: history of anesthetic complications  Airway Mallampati: II  TM Distance: >3 FB Neck ROM: Full    Dental no notable dental hx. (+) Teeth Intact   Pulmonary neg pulmonary ROS, neg sleep apnea, neg COPD, Patient abstained from smoking.Not current smoker   Pulmonary exam normal breath sounds clear to auscultation       Cardiovascular Exercise Tolerance: Good METS(-) hypertension(-) CAD and (-) Past MI negative cardio ROS (-) dysrhythmias  Rhythm:Regular Rate:Normal - Systolic murmurs    Neuro/Psych  Headaches  negative psych ROS   GI/Hepatic ,neg GERD  ,,(+)     (-) substance abuse    Endo/Other  neg diabetes    Renal/GU negative Renal ROS     Musculoskeletal   Abdominal   Peds  Hematology   Anesthesia Other Findings Past Medical History: No date: Back pain     Comment:  lumbar fracture No date: Diverticulosis No date: Headache     Comment:  Migraine No date: History of bronchitis     Comment:  2014 No date: History of colon polyps No date: History of kidney stones No date: History of migraine     Comment:  last one about 2wks ago No date: Hyperlipidemia     Comment:  takes Lovasatin daily No date: Seasonal allergies     Comment:  takes Claritin daily and Flonase daily  Reproductive/Obstetrics                             Anesthesia Physical Anesthesia Plan  ASA: 2  Anesthesia Plan: General   Post-op Pain Management: Minimal or no pain anticipated   Induction: Intravenous  PONV Risk Score and Plan: 3 and Propofol infusion, TIVA and Ondansetron  Airway Management Planned: Nasal Cannula  Additional Equipment: None  Intra-op Plan:    Post-operative Plan:   Informed Consent: I have reviewed the patients History and Physical, chart, labs and discussed the procedure including the risks, benefits and alternatives for the proposed anesthesia with the patient or authorized representative who has indicated his/her understanding and acceptance.     Dental advisory given  Plan Discussed with: CRNA and Surgeon  Anesthesia Plan Comments: (Discussed risks of anesthesia with patient, including possibility of difficulty with spontaneous ventilation under anesthesia necessitating airway intervention, PONV, and rare risks such as cardiac or respiratory or neurological events, and allergic reactions. Discussed the role of CRNA in patient's perioperative care. Patient understands.)       Anesthesia Quick Evaluation

## 2022-11-16 ENCOUNTER — Encounter: Payer: Self-pay | Admitting: Gastroenterology

## 2022-11-16 LAB — SURGICAL PATHOLOGY

## 2022-11-17 ENCOUNTER — Encounter: Payer: Self-pay | Admitting: Gastroenterology

## 2022-11-29 DIAGNOSIS — K080 Exfoliation of teeth due to systemic causes: Secondary | ICD-10-CM | POA: Diagnosis not present

## 2022-12-23 ENCOUNTER — Other Ambulatory Visit: Payer: Self-pay | Admitting: General Surgery

## 2022-12-23 DIAGNOSIS — Z1231 Encounter for screening mammogram for malignant neoplasm of breast: Secondary | ICD-10-CM

## 2023-01-16 ENCOUNTER — Other Ambulatory Visit: Payer: Self-pay | Admitting: Family Medicine

## 2023-01-16 DIAGNOSIS — E7800 Pure hypercholesterolemia, unspecified: Secondary | ICD-10-CM

## 2023-01-19 DIAGNOSIS — H5203 Hypermetropia, bilateral: Secondary | ICD-10-CM | POA: Diagnosis not present

## 2023-01-19 DIAGNOSIS — H2513 Age-related nuclear cataract, bilateral: Secondary | ICD-10-CM | POA: Diagnosis not present

## 2023-01-19 DIAGNOSIS — H5213 Myopia, bilateral: Secondary | ICD-10-CM | POA: Diagnosis not present

## 2023-01-25 DIAGNOSIS — K080 Exfoliation of teeth due to systemic causes: Secondary | ICD-10-CM | POA: Diagnosis not present

## 2023-02-13 ENCOUNTER — Ambulatory Visit
Admission: RE | Admit: 2023-02-13 | Discharge: 2023-02-13 | Disposition: A | Discharge status: Home / Self Care | Payer: Medicare Other | Source: Ambulatory Visit | Attending: General Surgery | Admitting: General Surgery

## 2023-02-13 DIAGNOSIS — Z1231 Encounter for screening mammogram for malignant neoplasm of breast: Secondary | ICD-10-CM | POA: Insufficient documentation

## 2023-02-21 DIAGNOSIS — Z1231 Encounter for screening mammogram for malignant neoplasm of breast: Secondary | ICD-10-CM | POA: Diagnosis not present

## 2023-02-22 DIAGNOSIS — H5203 Hypermetropia, bilateral: Secondary | ICD-10-CM | POA: Diagnosis not present

## 2023-04-06 NOTE — Progress Notes (Signed)
I,Monica  Delacruz,acting as a Neurosurgeon for [employer], PA-C.,have documented all relevant documentation on the behalf of Monica Ferguson, PA-C,as directed by  Monica Ferguson, PA-C while in the presence of Monica Ferguson, PA-C.   Established patient visit   Patient: Monica Delacruz   DOB: 03/01/1951   72 y.o. Female  MRN: [id number] Visit Date: 04/07/2023  Today's healthcare provider: [name], PA-C   Cc. Lower abdominal pain, right knee pain  Subjective    HPI  Discussed the use of AI scribe software for clinical note transcription with the patient, who gave verbal consent to proceed.  History of Present Illness   The patient, with a history of kidney stones and back surgery, presents with symptoms suggestive of a urinary tract infection (UTI) that started three days ago. They report lower abdominal pain, increased urinary frequency, and no hematuria or dysuria.   In addition to the UTI symptoms, the patient has been experiencing knee pain and sciatica for over three weeks. The pain started in the right side of her back, radiating down her leg and knee. Affecting her gait. They also report having planted corn about four weeks ago, which may have exacerbated their back and leg pain.  The patient has tried various treatments for their knee and back pain, including ibuprofen, Aleve, aspirin, lidocaine patches, and heat and ice therapy. However, these treatments have not provided significant relief. The patient has a known allergy or adverse reaction to tramadol, which causes nausea. They have also tried Percocet, Vicodin, and oxycodone in the past, but did not like the way these medications made them feel.       Medications: Outpatient Medications Prior to Visit  Medication Sig   butalbital-acetaminophen-caffeine (FIORICET) 50-325-40 MG tablet Take 1 tablet by mouth every 6 (six) hours as needed for migraine.   cetirizine (ZYRTEC) 10 MG tablet Take 10 mg by mouth daily.    fluticasone (FLONASE) 50 MCG/ACT nasal spray Place 1 spray into both nostrils daily as needed for allergies or rhinitis.   ibuprofen (ADVIL) 200 MG tablet Take 600 mg by mouth every 6 (six) hours as needed for moderate pain.   VITAMIN D PO Take 3 tablets by mouth daily. 600 units per   [DISCONTINUED] rosuvastatin (CRESTOR) 20 MG tablet Take 1 tablet (20 mg total) by mouth daily. (Patient not taking: Reported on 04/07/2023)   No facility-administered medications prior to visit.    Review of Systems  Genitourinary:  Positive for dysuria, flank pain, frequency and pelvic pain.  All other systems reviewed and are negative.     Objective    BP (!) 143/77 (BP Location: Right Arm, Patient Position: Sitting, Cuff Size: Normal)   Pulse 81   Temp 97.8 F (36.6 C) (Oral)   Resp 14   Ht 5\' 5"  (1.651 m)   Wt 168 lb 8 oz (76.4 kg)   SpO2 98%   BMI 28.04 kg/m   Physical Exam Vitals reviewed.  Constitutional:      Appearance: She is not ill-appearing.  HENT:     Head: Normocephalic.  Eyes:     Conjunctiva/sclera: Conjunctivae normal.  Cardiovascular:     Rate and Rhythm: Normal rate.  Pulmonary:     Effort: Pulmonary effort is normal. No respiratory distress.  Musculoskeletal:     Comments: Small effusion R knee.  Neurological:     General: No focal deficit present.     Mental Status: She is alert and oriented to person, place,  and time.  Psychiatric:        Mood and Affect: Mood normal.        Behavior: Behavior normal.     Results for orders placed or performed in visit on 04/07/23  POCT Urinalysis Dipstick  Result Value Ref Range   Color, UA Yellow    Clarity, UA Clear    Glucose, UA Negative Negative   Bilirubin, UA Negative    Ketones, UA Negative    Spec Grav, UA 1.015 1.010 - 1.025   Blood, UA Trace    pH, UA 7.0 5.0 - 8.0   Protein, UA Negative Negative   Urobilinogen, UA 0.2 0.2 or 1.0 E.U./dL   Nitrite, UA Negative    Leukocytes, UA Negative Negative    Appearance Clear    Odor      Assessment & Plan     Problem List Items Addressed This Visit       Other   Hypercholesterolemia without hypertriglyceridemia    Pt stopped crestor 20 mg as it gave her stomach pain  Recommending going back to lovastatin 40 mg        Relevant Medications   lovastatin (MEVACOR) 40 MG tablet   Other Visit Diagnoses     Lower abdominal pain    -  Primary   Relevant Medications   cephALEXin (KEFLEX) 500 MG capsule   Other Relevant Orders   POCT Urinalysis Dipstick (Completed)   Urine Culture   Urinalysis, microscopic only   Sciatica, left side       Relevant Medications   predniSONE (DELTASONE) 10 MG tablet   meloxicam (MOBIC) 15 MG tablet     1. Lower abdominal pain Stone vs UTI Tx w/ keflex  UA + blood, neg leuk Send for urine micro, culture. - POCT Urinalysis Dipstick - Urine Culture - cephALEXin (KEFLEX) 500 MG capsule; Take 1 capsule (500 mg total) by mouth 2 (two) times daily for 5 days.  Dispense: 10 capsule; Refill: 0 - Urinalysis, microscopic only  2. Sciatica, left side Tx with mobic, d/c alleve, ibuprofen. Rx steroid taper.  - predniSONE (DELTASONE) 10 MG tablet; Take 6 tablets (60 mg total) by mouth daily with breakfast for 1 day, THEN 5 tablets (50 mg total) daily with breakfast for 1 day, THEN 4 tablets (40 mg total) daily with breakfast for 1 day, THEN 3 tablets (30 mg total) daily with breakfast for 1 day, THEN 2 tablets (20 mg total) daily with breakfast for 1 day, THEN 1 tablet (10 mg total) daily with breakfast for 1 day.  Dispense: 21 tablet; Refill: 0 - meloxicam (MOBIC) 15 MG tablet; Take 1 tablet (15 mg total) by mouth daily.  Dispense: 30 tablet; Refill: 0  Return if symptoms worsen or fail to improve.      I, Monica Ferguson, PA-C have reviewed all documentation for this visit. The documentation on  04/07/23   for the exam, diagnosis, procedures, and orders are all accurate and complete.  Monica Ferguson,  PA-C [facility] [address] [place], [place], [address] Office: [phone] Fax: [phone]   [facility]

## 2023-04-07 ENCOUNTER — Encounter: Payer: Self-pay | Admitting: Physician Assistant

## 2023-04-07 ENCOUNTER — Ambulatory Visit (INDEPENDENT_AMBULATORY_CARE_PROVIDER_SITE_OTHER): Payer: Medicare Other | Admitting: Physician Assistant

## 2023-04-07 VITALS — BP 143/77 | HR 81 | Temp 97.8°F | Resp 14 | Ht 65.0 in | Wt 168.5 lb

## 2023-04-07 DIAGNOSIS — R1030 Lower abdominal pain, unspecified: Secondary | ICD-10-CM | POA: Diagnosis not present

## 2023-04-07 DIAGNOSIS — E7800 Pure hypercholesterolemia, unspecified: Secondary | ICD-10-CM

## 2023-04-07 DIAGNOSIS — M5432 Sciatica, left side: Secondary | ICD-10-CM

## 2023-04-07 DIAGNOSIS — R300 Dysuria: Secondary | ICD-10-CM | POA: Diagnosis not present

## 2023-04-07 LAB — POCT URINALYSIS DIPSTICK
Bilirubin, UA: NEGATIVE
Glucose, UA: NEGATIVE
Ketones, UA: NEGATIVE
Leukocytes, UA: NEGATIVE
Nitrite, UA: NEGATIVE
Protein, UA: NEGATIVE
Spec Grav, UA: 1.015 (ref 1.010–1.025)
Urobilinogen, UA: 0.2 E.U./dL
pH, UA: 7 (ref 5.0–8.0)

## 2023-04-07 MED ORDER — PREDNISONE 10 MG PO TABS: ORAL_TABLET | ORAL | 0 refills | Status: AC

## 2023-04-07 MED ORDER — CEPHALEXIN 500 MG PO CAPS: 500.0000 mg | ORAL_CAPSULE | Freq: Two times a day (BID) | ORAL | 0 refills | Status: AC

## 2023-04-07 MED ORDER — MELOXICAM 15 MG PO TABS: 15.0000 mg | ORAL_TABLET | Freq: Every day | ORAL | 0 refills | Status: DC

## 2023-04-07 MED ORDER — LOVASTATIN 40 MG PO TABS: 40.0000 mg | ORAL_TABLET | Freq: Every day | ORAL | 3 refills | Status: DC

## 2023-04-07 NOTE — Assessment & Plan Note (Signed)
Pt stopped crestor 20 mg as it gave her stomach pain  Recommending going back to lovastatin 40 mg

## 2023-04-08 LAB — URINALYSIS, MICROSCOPIC ONLY
Bacteria, UA: NONE SEEN
Casts: NONE SEEN /lpf
Epithelial Cells (non renal): NONE SEEN /hpf (ref 0–10)

## 2023-04-12 LAB — URINE CULTURE

## 2023-05-04 ENCOUNTER — Other Ambulatory Visit: Payer: Self-pay | Admitting: Physician Assistant

## 2023-05-04 DIAGNOSIS — M5432 Sciatica, left side: Secondary | ICD-10-CM

## 2023-05-30 DIAGNOSIS — M722 Plantar fascial fibromatosis: Secondary | ICD-10-CM | POA: Diagnosis not present

## 2023-05-31 DIAGNOSIS — K080 Exfoliation of teeth due to systemic causes: Secondary | ICD-10-CM | POA: Diagnosis not present

## 2023-06-07 ENCOUNTER — Other Ambulatory Visit: Payer: Self-pay | Admitting: Physician Assistant

## 2023-06-07 DIAGNOSIS — M5432 Sciatica, left side: Secondary | ICD-10-CM

## 2023-07-14 ENCOUNTER — Other Ambulatory Visit: Payer: Self-pay | Admitting: Family Medicine

## 2023-07-14 DIAGNOSIS — M5432 Sciatica, left side: Secondary | ICD-10-CM

## 2023-07-17 NOTE — Telephone Encounter (Signed)
Requested Prescriptions  Pending Prescriptions Disp Refills   meloxicam (MOBIC) 15 MG tablet [Pharmacy Med Name: MELOXICAM 15MG  TABLETS] 30 tablet 0    Sig: TAKE 1 TABLET(15 MG) BY MOUTH DAILY     Analgesics:  COX2 Inhibitors Failed - 07/14/2023  8:15 AM      Failed - Manual Review: Labs are only required if the patient has taken medication for more than 8 weeks.      Passed - HGB in normal range and within 360 days    Hemoglobin  Date Value Ref Range Status  11/01/2022 13.7 11.1 - 15.9 g/dL Final         Passed - Cr in normal range and within 360 days    Creat  Date Value Ref Range Status  10/20/2017 0.87 0.50 - 0.99 mg/dL Final    Comment:    For patients >21 years of age, the reference limit for Creatinine is approximately 13% higher for people identified as African-American. .    Creatinine, Ser  Date Value Ref Range Status  11/01/2022 0.80 0.57 - 1.00 mg/dL Final         Passed - HCT in normal range and within 360 days    Hematocrit  Date Value Ref Range Status  11/01/2022 41.3 34.0 - 46.6 % Final         Passed - AST in normal range and within 360 days    AST  Date Value Ref Range Status  11/01/2022 20 0 - 40 IU/L Final         Passed - ALT in normal range and within 360 days    ALT  Date Value Ref Range Status  11/01/2022 24 0 - 32 IU/L Final         Passed - eGFR is 30 or above and within 360 days    GFR, Est African American  Date Value Ref Range Status  10/20/2017 81 > OR = 60 mL/min/1.15m2 Final   GFR calc Af Amer  Date Value Ref Range Status  10/19/2020 89 >59 mL/min/1.73 Final    Comment:    **In accordance with recommendations from the NKF-ASN Task force,**   [facility] is in the process of updating its eGFR calculation to the   2021 CKD-EPI creatinine equation that estimates kidney function   without a race variable.    GFR, Est Non African American  Date Value Ref Range Status  10/20/2017 70 > OR = 60 mL/min/1.1m2 Final   GFR calc non  Af Amer  Date Value Ref Range Status  10/19/2020 77 >59 mL/min/1.73 Final   eGFR  Date Value Ref Range Status  11/01/2022 79 >59 mL/min/1.73 Final         Passed - Patient is not pregnant      Passed - Valid encounter within last 12 months    Recent Outpatient Visits           3 months ago Lower abdominal pain   [place] [facility] [name], PA-C   8 months ago Encounter for [employer] annual wellness exam   [facility] Edwards, Morgan, PA-C   1 year ago Annual physical exam   [place] [facility] Mecum, Oswaldo Conroy, PA-C   2 years ago Skin lesion of back   [place] [facility] Vigg, Avanti, MD   2 years ago Annual physical exam   [facility] [place], [place], [place]

## 2023-08-16 ENCOUNTER — Other Ambulatory Visit: Payer: Self-pay | Admitting: Family Medicine

## 2023-08-16 DIAGNOSIS — M5432 Sciatica, left side: Secondary | ICD-10-CM

## 2023-08-31 ENCOUNTER — Ambulatory Visit: Payer: Medicare Other | Admitting: Physician Assistant

## 2023-08-31 ENCOUNTER — Ambulatory Visit: Payer: Medicare Other | Admitting: Family Medicine

## 2023-08-31 ENCOUNTER — Encounter: Payer: Self-pay | Admitting: Family Medicine

## 2023-08-31 VITALS — BP 149/87 | HR 102

## 2023-08-31 DIAGNOSIS — J40 Bronchitis, not specified as acute or chronic: Secondary | ICD-10-CM | POA: Diagnosis not present

## 2023-08-31 MED ORDER — ALBUTEROL SULFATE HFA 108 (90 BASE) MCG/ACT IN AERS: 2.0000 | INHALATION_SPRAY | Freq: Four times a day (QID) | RESPIRATORY_TRACT | 2 refills | Status: DC | PRN

## 2023-08-31 MED ORDER — PREDNISONE 20 MG PO TABS: 40.0000 mg | ORAL_TABLET | Freq: Every day | ORAL | 0 refills | Status: AC

## 2023-08-31 MED ORDER — GUAIFENESIN-CODEINE 100-10 MG/5ML PO SOLN: 10.0000 mL | Freq: Three times a day (TID) | ORAL | 0 refills | Status: DC | PRN

## 2023-08-31 NOTE — Progress Notes (Signed)
Acute Office Visit  Subjective:     Patient ID: Monica Delacruz, female    DOB: [birth year], 72 y.o.   MRN: [id number]  Chief Complaint  Patient presents with   Cough    Present since last Friday, starting a little that Thursday night. No other symptoms. Started as a dry cough but turning into a productive cough that produces greenish-yellow phlegm when she is able to get something to come up. Patient reports taking mucinex, robitussin cough w/ homey and two shots of lemon vodka and honey with lemon. Reports cough has been keeping her awake at night. Patient reports no sick contacts. Patient has hx of bronchitis but she reports this cough seems worse than any other year    HPI Discussed the use of AI scribe software for clinical note transcription with the patient, who gave verbal consent to proceed.  History of Present Illness   The patient presents with a persistent cough that started about a week ago. Initially, the cough was dry, but has since progressed to productive with greenish-yellowish sputum. The patient reports difficulty in expectorating the sputum at times, leading to wheezing and a sensation of breathlessness. The cough is severe enough to disrupt sleep. The patient has a history of bronchitis, but reports that the current symptoms are worse than previous episodes. The patient suspects the onset of the cough may be related to exposure to ash dust while cleaning out a heater. Over-the-counter remedies such as Mucinex, Robitussin with honey, and home remedies involving vodka with honey and lemon have been tried without relief. The patient denies experiencing fever or a runny nose. In the past, the patient has been treated for bronchitis with an inhaler and antibiotics. The patient also reports a history of taking meloxicam for knee and hand pain, but discontinued due to stomach discomfort.       ROS      Objective:    BP (!) 149/87 (BP Location: Right Arm, Patient Position:  Sitting, Cuff Size: Normal)   Pulse (!) 102   SpO2 97%    Physical Exam Vitals reviewed.  Constitutional:      General: She is not in acute distress.    Appearance: Normal appearance. She is well-developed. She is not diaphoretic.  HENT:     Head: Normocephalic and atraumatic.  Eyes:     General: No scleral icterus.    Conjunctiva/sclera: Conjunctivae normal.  Neck:     Thyroid: No thyromegaly.  Cardiovascular:     Rate and Rhythm: Normal rate and regular rhythm.     Pulses: Normal pulses.     Heart sounds: Normal heart sounds. No murmur heard. Pulmonary:     Effort: Pulmonary effort is normal. No respiratory distress.     Breath sounds: Wheezing (diffusely) and rhonchi present. No rales.  Musculoskeletal:     Cervical back: Neck supple.     Right lower leg: No edema.     Left lower leg: No edema.  Lymphadenopathy:     Cervical: No cervical adenopathy.  Skin:    General: Skin is warm and dry.     Findings: No rash.  Neurological:     Mental Status: She is alert and oriented to person, place, and time. Mental status is at baseline.  Psychiatric:        Mood and Affect: Mood normal.        Behavior: Behavior normal.     No results found for any visits on 08/31/23.  Assessment & Plan:   Problem List Items Addressed This Visit   None Visit Diagnoses     Bronchitis    -  Primary           Acute Bronchitis Presents with a one-week history of a cough progressing to greenish-yellow sputum, wheezing, and nocturnal dyspnea. No fever or rhinorrhea. Previous episodes of bronchitis, with this one being more severe. Physical exam reveals diffuse wheezes, suggesting bronchitis over pneumonia. Likely viral etiology. Discussed that antibiotics are typically ineffective for viral bronchitis. Prednisone chosen to reduce inflammation and improve airflow. Inhaler prescribed for acute symptoms. Codeine-based cough medicine discussed, with caution regarding side effects and  avoiding driving until effects are known. - Prescribe prednisone 40 mg daily (20 mg tablets, take two tablets together in the morning with food) for one week - Prescribe albuterol inhaler for as-needed use for shortness of breath - Prescribe codeine-based cough medicine for as-needed use, with caution regarding side effects and avoiding driving until effects are known  Gastrointestinal Side Effects from Meloxicam Reports stomach discomfort from meloxicam prescribed for arthritis. Discussed discontinuing meloxicam and informing the pharmacy to stop automatic refills. - Discontinue meloxicam - Advise to inform pharmacy to stop automatic refills  General Health Maintenance Has not received a flu shot this year. Discussed timing of flu shot in relation to current illness and prednisone use. Advised that flu shot may be less effective while on prednisone and to postpone until feeling well. - Postpone flu shot until feeling well - Advise to schedule a flu shot appointment at the clinic or pharmacy  Follow-up - Schedule annual physical in January - Advise to return if symptoms do not improve or worsen.        Meds ordered this encounter  Medications   predniSONE (DELTASONE) 20 MG tablet    Sig: Take 2 tablets (40 mg total) by mouth daily with breakfast for 7 days.    Dispense:  14 tablet    Refill:  0   guaiFENesin-codeine 100-10 MG/5ML syrup    Sig: Take 10 mLs by mouth 3 (three) times daily as needed for cough.    Dispense:  120 mL    Refill:  0   albuterol (VENTOLIN HFA) 108 (90 Base) MCG/ACT inhaler    Sig: Inhale 2 puffs into the lungs every 6 (six) hours as needed for wheezing or shortness of breath.    Dispense:  8 g    Refill:  2    Return in about 2 months (around 10/31/2023) for CPE.  Shirlee Latch, MD

## 2023-08-31 NOTE — Progress Notes (Deleted)
Established patient visit  Patient: Monica Delacruz   DOB: June 10, 1951   72 y.o. Female  MRN: [id number] Visit Date: 08/31/2023  Today's healthcare provider: Debera Lat, PA-C   No chief complaint on file.  Subjective    Cough   *** Discussed the use of AI scribe software for clinical note transcription with the patient, who gave verbal consent to proceed.  History of Present Illness               04/07/2023   10:33 AM 10/22/2021    1:43 PM 09/15/2021   11:26 AM  Depression screen PHQ 2/9  Decreased Interest 0 0 0  Down, Depressed, Hopeless 0 0 0  PHQ - 2 Score 0 0 0  Altered sleeping 0 0   Tired, decreased energy 1 0   Change in appetite 0 0   Feeling bad or failure about yourself  0 0   Trouble concentrating 0 0   Moving slowly or fidgety/restless 0 0   Suicidal thoughts 0 0   PHQ-9 Score 1 0   Difficult doing work/chores Not difficult at all Not difficult at all        No data to display          Medications: Outpatient Medications Prior to Visit  Medication Sig  . butalbital-acetaminophen-caffeine (FIORICET) 50-325-40 MG tablet Take 1 tablet by mouth every 6 (six) hours as needed for migraine.  . cetirizine (ZYRTEC) 10 MG tablet Take 10 mg by mouth daily.  . fluticasone (FLONASE) 50 MCG/ACT nasal spray Place 1 spray into both nostrils daily as needed for allergies or rhinitis.  Marland Kitchen lovastatin (MEVACOR) 40 MG tablet Take 1 tablet (40 mg total) by mouth at bedtime.  . meloxicam (MOBIC) 15 MG tablet Take 1 tablet (15 mg total) by mouth daily. NEED Appointment!  Marland Kitchen VITAMIN D PO Take 3 tablets by mouth daily. 600 units per   No facility-administered medications prior to visit.    Review of Systems  All other systems reviewed and are negative.  Except see HPI   {Insert previous labs (optional):23779} {See past labs  Heme  Chem  Endocrine  Serology  Results Review (optional):1}   Objective    There were no vitals taken for this visit. {Insert last  BP/Wt (optional):23777}{See vitals history (optional):1}   Physical Exam Vitals reviewed.  Constitutional:      General: She is not in acute distress.    Appearance: Normal appearance. She is well-developed. She is not diaphoretic.  HENT:     Head: Normocephalic and atraumatic.  Eyes:     General: No scleral icterus.    Conjunctiva/sclera: Conjunctivae normal.  Neck:     Thyroid: No thyromegaly.  Cardiovascular:     Rate and Rhythm: Normal rate and regular rhythm.     Pulses: Normal pulses.     Heart sounds: Normal heart sounds. No murmur heard. Pulmonary:     Effort: Pulmonary effort is normal. No respiratory distress.     Breath sounds: Normal breath sounds. No wheezing, rhonchi or rales.  Musculoskeletal:     Cervical back: Neck supple.     Right lower leg: No edema.     Left lower leg: No edema.  Lymphadenopathy:     Cervical: No cervical adenopathy.  Skin:    General: Skin is warm and dry.     Findings: No rash.  Neurological:     Mental Status: She is alert and oriented to person, place, and time. Mental  status is at baseline.  Psychiatric:        Mood and Affect: Mood normal.        Behavior: Behavior normal.     No results found for any visits on 08/31/23.  Assessment & Plan    ***    No follow-ups on file.     The patient was advised to call back or seek an in-person evaluation if the symptoms worsen or if the condition fails to improve as anticipated.  I discussed the assessment and treatment plan with the patient. The patient was provided an opportunity to ask questions and all were answered. The patient agreed with the plan and demonstrated an understanding of the instructions.  I, Debera Lat, PA-C have reviewed all documentation for this visit. The documentation on 08/31/2023 for the exam, diagnosis, procedures, and orders are all accurate and complete.  Debera Lat, [facility], MMS [facility] [phone] (phone) [phone]  (fax)  [facility]

## 2023-09-04 ENCOUNTER — Telehealth: Payer: Self-pay | Admitting: Family Medicine

## 2023-09-04 ENCOUNTER — Other Ambulatory Visit: Payer: Self-pay | Admitting: Family Medicine

## 2023-09-04 DIAGNOSIS — M5432 Sciatica, left side: Secondary | ICD-10-CM

## 2023-09-04 NOTE — Telephone Encounter (Signed)
PT is calling in because she was told by Dr. B to call her in a few days if she didn't feel any better. Pt says she is not feeling any better and has been coughing up more phlegm and still feels bad. Pt says she doesn't know if she needs to come in for another appointment since she just had one or if something can be called in for her. Please follow up with pt.

## 2023-09-07 ENCOUNTER — Ambulatory Visit
Admission: RE | Admit: 2023-09-07 | Discharge: 2023-09-07 | Disposition: A | Discharge status: Home / Self Care | Payer: Medicare Other | Source: Ambulatory Visit | Attending: Family Medicine | Admitting: Family Medicine

## 2023-09-07 ENCOUNTER — Ambulatory Visit (INDEPENDENT_AMBULATORY_CARE_PROVIDER_SITE_OTHER): Payer: Medicare Other | Admitting: Family Medicine

## 2023-09-07 ENCOUNTER — Ambulatory Visit: Payer: Self-pay | Admitting: *Deleted

## 2023-09-07 ENCOUNTER — Encounter: Payer: Self-pay | Admitting: Family Medicine

## 2023-09-07 ENCOUNTER — Ambulatory Visit
Admission: RE | Admit: 2023-09-07 | Discharge: 2023-09-07 | Disposition: A | Discharge status: Home / Self Care | Payer: Medicare Other | Attending: Family Medicine | Admitting: Family Medicine

## 2023-09-07 VITALS — BP 168/79 | HR 84 | Resp 18 | Ht 65.0 in | Wt 171.0 lb

## 2023-09-07 DIAGNOSIS — J189 Pneumonia, unspecified organism: Secondary | ICD-10-CM | POA: Insufficient documentation

## 2023-09-07 DIAGNOSIS — R042 Hemoptysis: Secondary | ICD-10-CM | POA: Diagnosis not present

## 2023-09-07 DIAGNOSIS — R059 Cough, unspecified: Secondary | ICD-10-CM | POA: Diagnosis not present

## 2023-09-07 MED ORDER — AMOXICILLIN-POT CLAVULANATE 875-125 MG PO TABS: 1.0000 | ORAL_TABLET | Freq: Two times a day (BID) | ORAL | 0 refills | Status: AC

## 2023-09-07 MED ORDER — AZITHROMYCIN 250 MG PO TABS: ORAL_TABLET | ORAL | 0 refills | Status: AC

## 2023-09-07 NOTE — Telephone Encounter (Signed)
Pt seen by Dr.Robinson today

## 2023-09-07 NOTE — Assessment & Plan Note (Signed)
Persistent cough for one week, minimally improved with current treatment. Reports hemoptysis (bright red blood) on Sunday and Monday. Wheezing noted, particularly when lying down. Coarse breath sounds on the right side, mid to lower lung. Differential includes post-infectious cough and pneumonia. Plan to treat empirically for pneumonia due to clinical findings and persistent symptoms. Explained that post-infectious cough can linger for up to eight weeks. Discussed the potential benefits of antibiotics in treating suspected pneumonia and the limitations of current imaging availability. Informed that the x-ray is to establish a baseline and may not immediately change the treatment plan. - acute  - Treat with azithromycin 500 mg on day one, then 250 mg on days two through five - Order chest x-ray to establish baseline lung function - Counsel to return in seven days if symptoms worsen - Advise to go to ED if experiencing shortness of breath, chest pain, or difficulty breathing

## 2023-09-07 NOTE — Patient Instructions (Addendum)
Please report to [facility] located at:  [address]  [place], [place] [address]  You do not need an appointment to have xrays completed.   Our office will follow up with  results once available.   VISIT SUMMARY:  During today's visit, we discussed your persistent cough, which has not improved despite the initial treatment for bronchitis. You also reported coughing up bright red blood and experiencing wheezing, especially at night. We have decided to treat you for suspected pneumonia and have ordered a chest x-ray to establish a baseline for your lung function.  YOUR PLAN:  -PERSISTENT COUGH WITH HEMOPTYSIS: A persistent cough with hemoptysis means you have a continuous cough that includes coughing up blood. This can be a sign of a serious condition like pneumonia. We will treat you with azithromycin, an antibiotic, to address the suspected pneumonia. You should take 500 mg on the first day, followed by 250 mg daily for the next four days. We have also ordered a chest x-ray to check your lung function. Please return in seven days if your symptoms worsen, and go to the emergency department if you experience shortness of breath, chest pain, or difficulty breathing.  -BRONCHITIS: Bronchitis is an inflammation of the bronchial tubes, which carry air to your lungs. Despite the initial treatment, your symptoms have persisted. You should continue using the albuterol inhaler and increase its use to every four hours if needed, and every two hours if your symptoms become severe.  INSTRUCTIONS:  Please follow the prescribed antibiotic regimen for suspected pneumonia and continue using the albuterol inhaler as directed. Return in seven days if your symptoms worsen. Go to the emergency department if you experience shortness of breath, chest pain, or difficulty breathing.

## 2023-09-07 NOTE — Telephone Encounter (Signed)
  Chief Complaint: worsening episodes of coughing spells, wheezing, soreness in ribs/ breasts Symptoms: see above. Completed oral steroids, taking albuterol inhaler. Hard to get breath at times after coughing. Last OV 08/31/23 bronchitis Frequency: 08/31/23 Pertinent Negatives: Patient denies chest pain no difficulty breathing  Disposition: [] ED /[] Urgent Care (no appt availability in office) / [x] Appointment(In office/virtual)/ []  [place] [facility]/ [] Home Care/ [] Refused Recommended Disposition /[] [place] [facility]/ []  Follow-up with PCP Additional Notes:   No available OV with PCP. Scheduled appt 09/07/23.      Reason for Disposition  Continuous (nonstop) coughing interferes with work, school, or sleeping  Answer Assessment - Initial Assessment Questions 1. RESPIRATORY STATUS: "Describe your breathing?" (e.g., wheezing, shortness of breath, unable to speak, severe coughing)      Wheezing , shortness of breath  with coughing  2. ONSET: "When did this breathing problem begin?"      On going since 08/31/23 3. PATTERN "Does the difficult breathing come and go, or has it been constant since it started?"      Comes and goes worse coughing / wheezing at times 4. SEVERITY: "How bad is your breathing?" (e.g., mild, moderate, severe)    - MILD: No SOB at rest, mild SOB with walking, speaks normally in sentences, can lie down, no retractions, pulse < 100.    - MODERATE: SOB at rest, SOB with minimal exertion and prefers to sit, cannot lie down flat, speaks in phrases, mild retractions, audible wheezing, pulse 100-120.    - SEVERE: Very SOB at rest, speaks in single words, struggling to breathe, sitting hunched forward, retractions, pulse > 120      moderate 5. RECURRENT SYMPTOM: "Have you had difficulty breathing before?" If Yes, ask: "When was the last time?" and "What happened that time?"      Yes  6. CARDIAC HISTORY: "Do you have any history of heart disease?" (e.g., heart attack,  angina, bypass surgery, angioplasty)      na 7. LUNG HISTORY: "Do you have any history of lung disease?"  (e.g., pulmonary embolus, asthma, emphysema)     Hx bronchitits 8. CAUSE: "What do you think is causing the breathing problem?"      bronchitis 9. OTHER SYMPTOMS: "Do you have any other symptoms? (e.g., dizziness, runny nose, cough, chest pain, fever)     Cough productive at times. Milky white ribs/ breast sore  10. O2 SATURATION MONITOR:  "Do you use an oxygen saturation monitor (pulse oximeter) at home?" If Yes, ask: "What is your reading (oxygen level) today?" "What is your usual oxygen saturation reading?" (e.g., 95%)       na 11. PREGNANCY: "Is there any chance you are pregnant?" "When was your last menstrual period?"       na 12. TRAVEL: "Have you traveled out of the country in the last month?" (e.g., travel history, exposures)       na  Protocols used: Breathing Difficulty-A-AH

## 2023-09-07 NOTE — Progress Notes (Signed)
Established patient visit   Patient: Monica Delacruz   DOB: September 24, 1951   72 y.o. Female  MRN: [id number] Visit Date: 09/07/2023  Today's healthcare provider: Ronnald Ramp, MD   Chief Complaint  Patient presents with   Cough    Dx with viral bronchitis on 08/31/23, cough still persisting   Subjective     HPI     Cough    Additional comments: Dx with viral bronchitis on 08/31/23, cough still persisting      Last edited by Ashok Cordia, CMA on 09/07/2023  9:47 AM.       Discussed the use of AI scribe software for clinical note transcription with the patient, who gave verbal consent to proceed.  History of Present Illness   The patient, a 72 year old individual, presents with a persistent cough that has not improved despite treatment. One week prior, she was diagnosed with bronchitis and started on a regimen of albuterol inhaler, guanfacine, codeine cough syrup, and a five-day course of prednisone 40mg . Despite this treatment, the patient reports that the cough persists and has not significantly improved.  The patient describes the cough as severe enough to cause soreness in the chest area. She also reports experiencing wheezing when lying down at night. The patient has been adhering to the prescribed treatment plan, using the albuterol inhaler and codeine cough syrup as directed. However, she reports that these treatments have not been effective in alleviating the cough.  In addition to the persistent cough, the patient reports coughing up bright red blood on two consecutive days. This symptom prompted her to seek further medical attention. The patient has been attempting to expel the coughed-up material as quickly as possible to prevent further wheezing.  The patient has been using the albuterol inhaler every six hours, as per the prescription instructions. She has also been taking the codeine cough syrup at night, as it makes her feel jittery. During the day,  she has been using a different cough medicine that she purchased.         Past Medical History:  Diagnosis Date   Back pain    lumbar fracture   Diverticulosis    Headache    Migraine   History of bronchitis    2014   History of colon polyps    History of kidney stones    History of migraine    last one about 2wks ago   Hyperlipidemia    takes Lovasatin daily   Seasonal allergies    takes Claritin daily and Flonase daily    Medications: Outpatient Medications Prior to Visit  Medication Sig   albuterol (VENTOLIN HFA) 108 (90 Base) MCG/ACT inhaler Inhale 2 puffs into the lungs every 6 (six) hours as needed for wheezing or shortness of breath.   butalbital-acetaminophen-caffeine (FIORICET) 50-325-40 MG tablet Take 1 tablet by mouth every 6 (six) hours as needed for migraine.   cetirizine (ZYRTEC) 10 MG tablet Take 10 mg by mouth daily.   fluticasone (FLONASE) 50 MCG/ACT nasal spray Place 1 spray into both nostrils daily as needed for allergies or rhinitis.   guaiFENesin-codeine 100-10 MG/5ML syrup Take 10 mLs by mouth 3 (three) times daily as needed for cough.   lovastatin (MEVACOR) 40 MG tablet Take 1 tablet (40 mg total) by mouth at bedtime.   meloxicam (MOBIC) 15 MG tablet TAKE 1 TABLET(15 MG) BY MOUTH DAILY   predniSONE (DELTASONE) 20 MG tablet Take 2 tablets (40 mg total) by mouth daily  with breakfast for 7 days.   VITAMIN D PO Take 3 tablets by mouth daily. 600 units per   No facility-administered medications prior to visit.    Review of Systems  Last metabolic panel Lab Results  Component Value Date   GLUCOSE 91 11/01/2022   NA 140 11/01/2022   K 4.2 11/01/2022   CL 105 11/01/2022   CO2 20 11/01/2022   BUN 15 11/01/2022   CREATININE 0.80 11/01/2022   EGFR 79 11/01/2022   CALCIUM 9.3 11/01/2022   PROT 6.4 11/01/2022   ALBUMIN 4.3 11/01/2022   LABGLOB 2.1 11/01/2022   AGRATIO 2.0 11/01/2022   BILITOT 0.5 11/01/2022   ALKPHOS 88 11/01/2022   AST 20  11/01/2022   ALT 24 11/01/2022   ANIONGAP 5 08/31/2016   Last lipids Lab Results  Component Value Date   CHOL 203 (H) 11/01/2022   HDL 69 11/01/2022   LDLCALC 120 (H) 11/01/2022   TRIG 79 11/01/2022   CHOLHDL 3.5 10/27/2021        Objective    BP (!) 154/86   Pulse 84   Resp 18   Ht 5\' 5"  (1.651 m)   Wt 171 lb (77.6 kg)   SpO2 99%   BMI 28.46 kg/m  BP Readings from Last 3 Encounters:  09/07/23 (!) 154/86  08/31/23 (!) 149/87  04/07/23 (!) 143/77   Wt Readings from Last 3 Encounters:  09/07/23 171 lb (77.6 kg)  04/07/23 168 lb 8 oz (76.4 kg)  11/15/22 173 lb (78.5 kg)       Physical Exam  Physical Exam   GEN: ill appearing, not in respiratory distress  CHEST: Right lung coarse breath sounds in mid to lower part, left lung sounds clear.       No results found for any visits on 09/07/23.  Assessment & Plan     Problem List Items Addressed This Visit       Respiratory   Community acquired pneumonia of right lower lobe of lung - Primary    Persistent cough for one week, minimally improved with current treatment. Reports hemoptysis (bright red blood) on Sunday and Monday. Wheezing noted, particularly when lying down. Coarse breath sounds on the right side, mid to lower lung. Differential includes post-infectious cough and pneumonia. Plan to treat empirically for pneumonia due to clinical findings and persistent symptoms. Explained that post-infectious cough can linger for up to eight weeks. Discussed the potential benefits of antibiotics in treating suspected pneumonia and the limitations of current imaging availability. Informed that the x-ray is to establish a baseline and may not immediately change the treatment plan. - acute  - Treat with azithromycin 500 mg on day one, then 250 mg on days two through five - Order chest x-ray to establish baseline lung function - Counsel to return in seven days if symptoms worsen - Advise to go to ED if experiencing shortness  of breath, chest pain, or difficulty breathing      Relevant Medications   amoxicillin-clavulanate (AUGMENTIN) 875-125 MG tablet   azithromycin (ZITHROMAX) 250 MG tablet   Other Relevant Orders   DG Chest 2 View      Bronchitis Diagnosed with bronchitis one week ago. Treated with albuterol inhaler, guanfacine, codeine cough syrup, and prednisone. Symptoms persist despite treatment. Discussed increasing the frequency of albuterol inhaler use to every four hours, and every two hours if symptoms are severe. - Continue using albuterol inhaler, increase frequency to every four hours if needed, and every two  hours if symptoms are severe.         Return in about 1 week (around 09/14/2023), or if symptoms worsen or fail to improve.         Ronnald Ramp, MD  [facility] [phone] (phone) [phone] (fax)  [facility]

## 2023-09-26 ENCOUNTER — Telehealth: Payer: Self-pay | Admitting: *Deleted

## 2023-09-26 NOTE — Telephone Encounter (Signed)
Patient returned call for x-ray results and notified: Chest xray was normal

## 2023-10-02 ENCOUNTER — Encounter: Payer: Self-pay | Admitting: Pharmacist

## 2023-10-02 NOTE — Progress Notes (Unsigned)
   10/02/2023  Patient ID: Monica Delacruz, female   DOB: 18-Jul-1951, 72 y.o.   MRN: [id number]  Pharmacy Quality Measure Review  This patient is appearing on a report for the adherence measure for cholesterol (statin) medications this calendar year.   Medication: lovastatin 40 mg Last fill date: 07/04/2023 for 90 day supply  Attempt to reach patient by telephone today. Unable to leave a message as voicemail is not setup.  Estelle Grumbles, PharmD, [facility] [phone]

## 2023-11-02 ENCOUNTER — Other Ambulatory Visit: Payer: Self-pay | Admitting: Family Medicine

## 2023-11-02 DIAGNOSIS — M5432 Sciatica, left side: Secondary | ICD-10-CM

## 2023-11-06 NOTE — Telephone Encounter (Signed)
 Requested medication (s) are due for refill today: yes  Requested medication (s) are on the active medication list: yes  Last refill:  09/05/23 #30 tabs 1 RF  Future visit scheduled: yes  Notes to clinic:  overdue lab work    Requested Prescriptions  Pending Prescriptions Disp Refills   meloxicam  (MOBIC ) 15 MG tablet [Pharmacy Med Name: MELOXICAM  15MG  TABLETS] 30 tablet 1    Sig: TAKE 1 TABLET(15 MG) BY MOUTH DAILY     Analgesics:  COX2 Inhibitors Failed - 11/06/2023 10:09 AM      Failed - Manual Review: Labs are only required if the patient has taken medication for more than 8 weeks.      Failed - HGB in normal range and within 360 days    Hemoglobin  Date Value Ref Range Status  11/01/2022 13.7 11.1 - 15.9 g/dL Final         Failed - Cr in normal range and within 360 days    Creat  Date Value Ref Range Status  10/20/2017 0.87 0.50 - 0.99 mg/dL Final    Comment:    For patients >17 years of age, the reference limit for Creatinine is approximately 13% higher for people identified as African-American. .    Creatinine, Ser  Date Value Ref Range Status  11/01/2022 0.80 0.57 - 1.00 mg/dL Final         Failed - HCT in normal range and within 360 days    Hematocrit  Date Value Ref Range Status  11/01/2022 41.3 34.0 - 46.6 % Final         Failed - AST in normal range and within 360 days    AST  Date Value Ref Range Status  11/01/2022 20 0 - 40 IU/L Final         Failed - ALT in normal range and within 360 days    ALT  Date Value Ref Range Status  11/01/2022 24 0 - 32 IU/L Final         Failed - eGFR is 30 or above and within 360 days    GFR, Est African American  Date Value Ref Range Status  10/20/2017 81 > OR = 60 mL/min/1.60m2 Final   GFR calc Af Amer  Date Value Ref Range Status  10/19/2020 89 >59 mL/min/1.73 Final    Comment:    **In accordance with recommendations from the NKF-ASN Task force,**   [facility] is in the process of updating its eGFR  calculation to the   2021 CKD-EPI creatinine equation that estimates kidney function   without a race variable.    GFR, Est Non African American  Date Value Ref Range Status  10/20/2017 70 > OR = 60 mL/min/1.33m2 Final   GFR calc non Af Amer  Date Value Ref Range Status  10/19/2020 77 >59 mL/min/1.73 Final   eGFR  Date Value Ref Range Status  11/01/2022 79 >59 mL/min/1.73 Final         Passed - Patient is not pregnant      Passed - Valid encounter within last 12 months    Recent Outpatient Visits           2 months ago Community acquired pneumonia of right lower lobe of lung   [facility] Simmons-Robinson, [place], [place]   2 months ago Bronchitis   [facility] [place], Jon HERO, MD   7 months ago Lower abdominal pain   [place] [facility]  Cyndi Shaver, PA-C   1 year ago Encounter for [employer] annual wellness exam   [facility] Cyndi Shaver, [place]   2 years ago Annual physical exam   [place] [facility] Mecum, Rocky BRAVO, PA-C       Future Appointments             In 1 week Bacigalupo, Jon HERO, MD [facility]

## 2023-11-14 ENCOUNTER — Encounter: Payer: Self-pay | Admitting: Family Medicine

## 2023-11-14 ENCOUNTER — Other Ambulatory Visit: Payer: Self-pay | Admitting: Family Medicine

## 2023-11-14 ENCOUNTER — Ambulatory Visit (INDEPENDENT_AMBULATORY_CARE_PROVIDER_SITE_OTHER): Payer: Medicare Other | Admitting: Family Medicine

## 2023-11-14 ENCOUNTER — Ambulatory Visit (INDEPENDENT_AMBULATORY_CARE_PROVIDER_SITE_OTHER): Payer: Medicare Other

## 2023-11-14 VITALS — BP 130/84 | HR 75 | Wt 175.9 lb

## 2023-11-14 VITALS — BP 160/80 | Ht 65.0 in | Wt 176.0 lb

## 2023-11-14 DIAGNOSIS — N3946 Mixed incontinence: Secondary | ICD-10-CM

## 2023-11-14 DIAGNOSIS — M8589 Other specified disorders of bone density and structure, multiple sites: Secondary | ICD-10-CM

## 2023-11-14 DIAGNOSIS — E559 Vitamin D deficiency, unspecified: Secondary | ICD-10-CM

## 2023-11-14 DIAGNOSIS — E7800 Pure hypercholesterolemia, unspecified: Secondary | ICD-10-CM | POA: Diagnosis not present

## 2023-11-14 DIAGNOSIS — Z780 Asymptomatic menopausal state: Secondary | ICD-10-CM

## 2023-11-14 DIAGNOSIS — L989 Disorder of the skin and subcutaneous tissue, unspecified: Secondary | ICD-10-CM

## 2023-11-14 DIAGNOSIS — Z0000 Encounter for general adult medical examination without abnormal findings: Secondary | ICD-10-CM | POA: Diagnosis not present

## 2023-11-14 DIAGNOSIS — Z23 Encounter for immunization: Secondary | ICD-10-CM | POA: Diagnosis not present

## 2023-11-14 NOTE — Progress Notes (Signed)
Subjective:   Monica Delacruz is a 73 y.o. female who presents for Medicare Annual (Subsequent) preventive examination.  Visit Complete: In person  Cardiac Risk Factors include: advanced age (>68men, >39 women);dyslipidemia     Objective:    Today's Vitals   11/14/23 1005  BP: (!) 160/80  Weight: 176 lb (79.8 kg)  Height: 5\' 5"  (1.651 m)   Body mass index is 29.29 kg/m.     11/14/2023   10:16 AM 11/15/2022    9:15 AM 09/15/2021   11:25 AM 07/15/2021    8:14 AM 08/24/2020    8:35 AM 02/25/2020   10:48 AM 09/27/2016    8:08 AM  Advanced Directives  Does Patient Have a Medical Advance Directive? No No No No No No No  Would patient like information on creating a medical advance directive? No - Patient declined  No - Patient declined Yes (MAU/Ambulatory/Procedural Areas - Information given) No - Patient declined  No - Patient declined    Current Medications (verified) Outpatient Encounter Medications as of 11/14/2023  Medication Sig   albuterol (VENTOLIN HFA) 108 (90 Base) MCG/ACT inhaler INHALE 2 PUFFS INTO THE LUNGS EVERY 6 HOURS AS NEEDED FOR WHEEZING OR SHORTNESS OF BREATH   cetirizine (ZYRTEC) 10 MG tablet Take 10 mg by mouth daily.   fluticasone (FLONASE) 50 MCG/ACT nasal spray Place 1 spray into both nostrils daily as needed for allergies or rhinitis.   lovastatin (MEVACOR) 40 MG tablet Take 1 tablet (40 mg total) by mouth at bedtime.   butalbital-acetaminophen-caffeine (FIORICET) 50-325-40 MG tablet Take 1 tablet by mouth every 6 (six) hours as needed for migraine. (Patient not taking: Reported on 11/14/2023)   guaiFENesin-codeine 100-10 MG/5ML syrup Take 10 mLs by mouth 3 (three) times daily as needed for cough. (Patient not taking: Reported on 11/14/2023)   meloxicam (MOBIC) 15 MG tablet TAKE 1 TABLET(15 MG) BY MOUTH DAILY (Patient not taking: Reported on 11/14/2023)   VITAMIN D PO Take 3 tablets by mouth daily. 600 units per   No facility-administered encounter medications  on file as of 11/14/2023.    Allergies (verified) Tramadol, Aspirin, Morphine and codeine, Ofloxacin, Oxycodone hcl, Simvastatin, Sulfa antibiotics, and Adhesive [tape]   History: Past Medical History:  Diagnosis Date   Back pain    lumbar fracture   Diverticulosis    Headache    Migraine   History of bronchitis    2014   History of colon polyps    History of kidney stones    History of migraine    last one about 2wks ago   Hyperlipidemia    takes Lovasatin daily   Seasonal allergies    takes Claritin daily and Flonase daily   Past Surgical History:  Procedure Laterality Date   ABDOMINAL HYSTERECTOMY  1985   please note pt reported today 10/28/22 that it was in 1995   BACK SURGERY     BREAST BIOPSY Left 02/12/2020   Stereo Bx, X-clip, neg   BREAST BIOPSY WITH RADIO FREQUENCY LOCALIZER Left 03/04/2020   Procedure: BREAST BIOPSY WITH RADIO FREQUENCY LOCALIZER;  Surgeon: Carolan Shiver, MD;  Location: [facility];  Service: General;  Laterality: Left;   COLONOSCOPY     COLONOSCOPY WITH PROPOFOL N/A 11/15/2022   Procedure: COLONOSCOPY WITH PROPOFOL;  Surgeon: Wyline Mood, MD;  Location: [facility];  Service: Gastroenterology;  Laterality: N/A;   CYSTOSCOPY     CYSTOSCOPY W/ URETERAL STENT PLACEMENT Right 10/03/2016   Procedure: CYSTOSCOPY WITH STENT REPLACEMENT;  Surgeon: Vanna Scotland, MD;  Location: [facility];  Service: Urology;  Laterality: Right;   CYSTOSCOPY/RETROGRADE/URETEROSCOPY Right 08/29/2016   Procedure: CYSTOSCOPY/RETROGRADE/stent placement;  Surgeon: Sebastian Ache, MD;  Location: [facility];  Service: Urology;  Laterality: Right;   EXCISION OF TONGUE LESION Left 07/15/2021   Procedure: EXCISION OF MUCOSA LIP LESION;  Surgeon: Vernie Murders, MD;  Location: [facility];  Service: ENT;  Laterality: Left;   FOOT SURGERY     bilateral   JOINT REPLACEMENT Left 2009   knee   LAMINECTOMY WITH POSTERIOR LATERAL ARTHRODESIS LEVEL 4 N/A 07/12/2013    Procedure: Thoracic Ten Through Lumbar Three posterior lateral fusion with instrumentation;  Surgeon: Carmela Hurt, MD;  Location: [facility];  Service: Neurosurgery;  Laterality: N/A;  Thoracic Ten Through Lumbar Three posterior lateral fusion with instrumentation   LITHOTRIPSY     x 2   NASAL SINUS SURGERY     x 2   URETEROSCOPY WITH HOLMIUM LASER LITHOTRIPSY Right 10/03/2016   Procedure: URETEROSCOPY WITH HOLMIUM LASER LITHOTRIPSY;  Surgeon: Vanna Scotland, MD;  Location: [facility];  Service: Urology;  Laterality: Right;   Family History  Problem Relation Age of Onset   Hematuria Mother    Diabetes type II Mother    Hypertension Father    Stomach cancer Father    Breast cancer Maternal Grandmother    Social History   Socioeconomic History   Marital status: Married    Spouse name: Not on file   Number of children: 2   Years of education: Not on file   Highest education level: Some college, no degree  Occupational History   Occupation: retired  Tobacco Use   Smoking status: Never   Smokeless tobacco: Never  Vaping Use   Vaping status: Never Used  Substance and Sexual Activity   Alcohol use: Yes    Comment: wine rarely   Drug use: No   Sexual activity: Yes    Birth control/protection: Surgical  Other Topics Concern   Not on file  Social History Narrative   Not on file   Social Drivers of Health   Financial Resource Strain: Low Risk  (11/14/2023)   Overall Financial Resource Strain (CARDIA)    Difficulty of Paying Living Expenses: Not hard at all  Food Insecurity: No Food Insecurity (11/14/2023)   Hunger Vital Sign    Worried About Running Out of Food in the Last Year: Never true    Ran Out of Food in the Last Year: Never true  Transportation Needs: No Transportation Needs (11/14/2023)   PRAPARE - Administrator, Civil Service (Medical): No    Lack of Transportation (Non-Medical): No  Physical Activity: Insufficiently Active (11/14/2023)   Exercise  Vital Sign    Days of Exercise per Week: 4 days    Minutes of Exercise per Session: 30 min  Stress: Stress Concern Present (11/14/2023)   [employer] of Occupational Health - Occupational Stress Questionnaire    Feeling of Stress : To some extent  Social Connections: Moderately Integrated (11/14/2023)   Social Connection and Isolation Panel [NHANES]    Frequency of Communication with Friends and Family: More than three times a week    Frequency of Social Gatherings with Friends and Family: More than three times a week    Attends Religious Services: More than 4 times per year    Active Member of [employer] or Organizations: No    Attends Banker Meetings: Never    Marital  Status: Married    Tobacco Counseling Counseling given: Not Answered   Clinical Intake:  Pre-visit preparation completed: Yes  Pain : No/denies pain     BMI - recorded: 29.29 Nutritional Status: BMI 25 -29 Overweight Nutritional Risks: None Diabetes: No  How often do you need to have someone help you when you read instructions, pamphlets, or other written materials from your doctor or pharmacy?: 1 - Never  Interpreter Needed?: No  Information entered by :: Kennedy Bucker, LPN   Activities of Daily Living    11/14/2023   10:18 AM 04/07/2023   10:33 AM  In your present state of health, do you have any difficulty performing the following activities:  Hearing? 0 0  Vision? 0 0  Difficulty concentrating or making decisions? 0 0  Walking or climbing stairs? 0 0  Dressing or bathing? 0 0  Doing errands, shopping? 0 0  Preparing Food and eating ? N   Using the Toilet? N   In the past six months, have you accidently leaked urine? Y   Do you have problems with loss of bowel control? N   Managing your Medications? N   Managing your Finances? N   Housekeeping or managing your Housekeeping? N     Patient Care Team: Erasmo Downer, MD as PCP - General (Family Medicine) Isla Pence,  OD (Optometry)  Indicate any recent Medical Services you may have received from other than [facility] providers in the past year (date may be approximate).     Assessment:   This is a routine wellness examination for [place].  Hearing/Vision screen Hearing Screening - Comments:: NO AIDS Vision Screening - Comments:: WEARS CONTACTS- [facility]   Goals Addressed             This Visit's Progress    DIET - EAT MORE FRUITS AND VEGETABLES         Depression Screen    09/07/2023    9:49 AM 04/07/2023   10:33 AM 10/22/2021    1:43 PM 09/15/2021   11:26 AM 09/15/2021   11:22 AM 04/14/2021   10:41 AM 10/14/2020    2:39 PM  PHQ 2/9 Scores  PHQ - 2 Score 0 0 0 0 0 0 0  PHQ- 9 Score 0 1 0    0    Fall Risk    11/14/2023   10:18 AM 04/07/2023   10:33 AM 10/22/2021    1:42 PM 09/15/2021   11:26 AM 04/14/2021   10:41 AM  Fall Risk   Falls in the past year? 0 0 0 0 0  Number falls in past yr: 0 0 0 0 0  Injury with Fall? 0 0 0 0 0  Risk for fall due to : No Fall Risks No Fall Risks No Fall Risks No Fall Risks No Fall Risks  Follow up Falls prevention discussed;Falls evaluation completed Falls evaluation completed Falls evaluation completed Falls prevention discussed Falls evaluation completed    MEDICARE RISK AT HOME: Medicare Risk at Home Any stairs in or around the home?: Yes If so, are there any without handrails?: No Home free of loose throw rugs in walkways, pet beds, electrical cords, etc?: Yes Adequate lighting in your home to reduce risk of falls?: Yes Life alert?: No Use of a cane, walker or w/c?: No Grab bars in the bathroom?: Yes Shower chair or bench in shower?: Yes Elevated toilet seat or a handicapped toilet?: Yes  TIMED UP AND GO:  Was the test  performed?  Yes  Length of time to ambulate 10 feet: 4 sec Gait steady and fast without use of assistive device    Cognitive Function:        11/14/2023   10:21 AM  6CIT Screen  What Year? 0 points  What month? 0  points  What time? 0 points  Count back from 20 0 points  Months in reverse 0 points  Repeat phrase 0 points  Total Score 0 points    Immunizations Immunization History  Administered Date(s) Administered   Fluad Quad(high Dose 65+) 08/20/2019, 10/14/2020, 09/15/2021, 10/28/2022   Influenza, High Dose Seasonal PF 10/10/2017   Influenza,inj,Quad PF,6+ Mos 07/14/2013, 08/23/2016   PFIZER(Purple Top)SARS-COV-2 Vaccination 05/21/2020, 06/11/2020   PNEUMOCOCCAL CONJUGATE-20 10/28/2022   Pneumococcal Conjugate-13 10/10/2017   Pneumococcal Polysaccharide-23 08/30/2016   Tdap 06/17/2011    TDAP status: Due, Education has been provided regarding the importance of this vaccine. Advised may receive this vaccine at local pharmacy or Health Dept. Aware to provide a copy of the vaccination record if obtained from local pharmacy or Health Dept. Verbalized acceptance and understanding.  Flu Vaccine status: Completed at today's visit  Pneumococcal vaccine status: Up to date  Covid-19 vaccine status: Completed vaccines  Qualifies for Shingles Vaccine? Yes   Zostavax completed No   Shingrix Completed?: No.    Education has been provided regarding the importance of this vaccine. Patient has been advised to call insurance company to determine out of pocket expense if they have not yet received this vaccine. Advised may also receive vaccine at local pharmacy or Health Dept. Verbalized acceptance and understanding.  Screening Tests Health Maintenance  Topic Date Due   Zoster Vaccines- Shingrix (1 of 2) Never done   COVID-19 Vaccine (3 - Pfizer risk series) 07/09/2020   DTaP/Tdap/Td (2 - Td or Tdap) 06/16/2021   DEXA SCAN  11/16/2022   INFLUENZA VACCINE  01/22/2024 (Originally 05/25/2023)   MAMMOGRAM  02/13/2024   Medicare Annual Wellness (AWV)  11/13/2024   Colonoscopy  11/16/2027   Pneumonia Vaccine 8+ Years old  Completed   Hepatitis C Screening  Completed   HPV VACCINES  Aged Out    Health  Maintenance  Health Maintenance Due  Topic Date Due   Zoster Vaccines- Shingrix (1 of 2) Never done   COVID-19 Vaccine (3 - Pfizer risk series) 07/09/2020   DTaP/Tdap/Td (2 - Td or Tdap) 06/16/2021   DEXA SCAN  11/16/2022    Colorectal cancer screening: Type of screening: Colonoscopy. Completed 11/15/22. Repeat every 5 years  Mammogram status: Completed 02/13/23. Repeat every year  Bone Density status: Ordered 11/14/23. Pt provided with contact info and advised to call to schedule appt.  Lung Cancer Screening: (Low Dose CT Chest recommended if Age 26-80 years, 20 pack-year currently smoking OR have quit w/in 15years.) does not qualify.    Additional Screening:  Hepatitis C Screening: does qualify; Completed 09/03/12  Vision Screening: Recommended annual ophthalmology exams for early detection of glaucoma and other disorders of the eye. Is the patient up to date with their annual eye exam?  Yes  Who is the provider or what is the name of the office in which the patient attends annual eye exams? [facility] If pt is not established with a provider, would they like to be referred to a provider to establish care? No .   Dental Screening: Recommended annual dental exams for proper oral hygiene   Community Resource Referral / Chronic Care Management: CRR required this visit?  No   CCM required this visit?  No     Plan:     I have personally reviewed and noted the following in the patient's chart:   Medical and social history Use of alcohol, tobacco or illicit drugs  Current medications and supplements including opioid prescriptions. Patient is not currently taking opioid prescriptions. Functional ability and status Nutritional status Physical activity Advanced directives List of other physicians Hospitalizations, surgeries, and ER visits in previous 12 months Vitals Screenings to include cognitive, depression, and falls Referrals and appointments  In addition, I have  reviewed and discussed with patient certain preventive protocols, quality metrics, and best practice recommendations. A written personalized care plan for preventive services as well as general preventive health recommendations were provided to patient.     Hal Hope, LPN   [id number]   After Visit Summary: (In Person-Printed) AVS printed and given to the patient  Nurse Notes: REFERRAL FOR BDS SENT

## 2023-11-14 NOTE — Telephone Encounter (Signed)
Requested Prescriptions  Pending Prescriptions Disp Refills   albuterol (VENTOLIN HFA) 108 (90 Base) MCG/ACT inhaler [Pharmacy Med Name: ALBUTEROL HFA INH (200 PUFFS) 6.7GM] 6.7 g 0    Sig: INHALE 2 PUFFS INTO THE LUNGS EVERY 6 HOURS AS NEEDED FOR WHEEZING OR SHORTNESS OF BREATH     Pulmonology:  Beta Agonists 2 Failed - 11/14/2023  8:26 AM      Failed - Last BP in normal range    BP Readings from Last 1 Encounters:  09/07/23 (!) 168/79         Passed - Last Heart Rate in normal range    Pulse Readings from Last 1 Encounters:  09/07/23 84         Passed - Valid encounter within last 12 months    Recent Outpatient Visits           2 months ago Community acquired pneumonia of right lower lobe of lung   [facility] [place], [place], MD   2 months ago Bronchitis   [facility] [place], Marzella Schlein, MD   7 months ago Lower abdominal pain   [facility] [name], PA-C   1 year ago Encounter for [employer] annual wellness exam   [facility] [name], PA-C   2 years ago Annual physical exam   [facility] M[place], Oswaldo Conroy, PA-C       Future Appointments             Today Bacigal[place], Marzella Schlein, MD [facility]

## 2023-11-14 NOTE — Progress Notes (Addendum)
CPE     Patient: Monica Delacruz, Female    DOB: 1951/03/20, 73 y.o.   MRN: [id number] Visit Date: 11/14/2023  Today's Provider: Shirlee Latch, MD   Chief Complaint  Patient presents with   Annual Exam    Diet- General, healthy Exercise - walking when the weather permits so not to much recently Feeling - well Sleeping- well Concerns - none   Subjective    Monica Delacruz is a 73 y.o. female who presents today for her Annual CPE.   Discussed the use of AI scribe software for clinical note transcription with the patient, who gave verbal consent to proceed.  History of Present Illness   The patient, with a history of kidney stones, back surgery, and a hysterectomy, presents for a wellness visit and physical. The primary concern is worsening bladder leakage, which has been an issue for several years. The patient reports that the leakage has worsened since a kidney stone surgery and has been particularly problematic during a recent bout of bronchitis. The patient has tried Kegel exercises to manage the issue but reports no significant improvement.  In addition to the bladder leakage, the patient mentions a persistent cough from a recent bout of bronchitis. The cough is not severe but is present mainly in the morning and at night.  The patient also brings up a spot on their leg that has been present for several years. The spot occasionally becomes scaly and itchy, and the patient is concerned about the possibility of skin cancer, given a family history of the disease.  Lastly, the patient mentions a history of back issues, including surgery to insert pins and rods. The patient reports a recent incident where they lost control of their bladder after straining their back. Since then, the patient has noticed an inability to feel when leakage occurs.             Medications: Outpatient Medications Prior to Visit  Medication Sig   albuterol (VENTOLIN HFA) 108 (90 Base)  MCG/ACT inhaler INHALE 2 PUFFS INTO THE LUNGS EVERY 6 HOURS AS NEEDED FOR WHEEZING OR SHORTNESS OF BREATH   butalbital-acetaminophen-caffeine (FIORICET) 50-325-40 MG tablet Take 1 tablet by mouth every 6 (six) hours as needed for migraine. (Patient not taking: Reported on 11/14/2023)   cetirizine (ZYRTEC) 10 MG tablet Take 10 mg by mouth daily.   fluticasone (FLONASE) 50 MCG/ACT nasal spray Place 1 spray into both nostrils daily as needed for allergies or rhinitis.   guaiFENesin-codeine 100-10 MG/5ML syrup Take 10 mLs by mouth 3 (three) times daily as needed for cough. (Patient not taking: Reported on 11/14/2023)   lovastatin (MEVACOR) 40 MG tablet Take 1 tablet (40 mg total) by mouth at bedtime.   meloxicam (MOBIC) 15 MG tablet TAKE 1 TABLET(15 MG) BY MOUTH DAILY (Patient not taking: Reported on 11/14/2023)   VITAMIN D PO Take 3 tablets by mouth daily. 600 units per   No facility-administered medications prior to visit.    Allergies  Allergen Reactions   Tramadol Diarrhea, Nausea And Vomiting and Nausea Only    Other reaction(s): Vomiting   Aspirin Other (See Comments)    Burning. Burns stomach Other reaction(s): Abdominal Pain, Other (See Comments) Burning.   Morphine And Codeine Itching   Ofloxacin Nausea Only   Oxycodone Hcl     Weird dreams   Simvastatin     GI upset   Sulfa Antibiotics Hives, Itching and Swelling   Adhesive [Tape] Other (See Comments)  Blisters skin    Patient Care Team: Erasmo Downer, MD as PCP - General (Family Medicine) Isla Pence, OD (Optometry)  Review of Systems       Objective    Vitals: BP 130/84 (BP Location: Left Arm, Patient Position: Sitting, Cuff Size: Large)   Pulse 75   Wt 175 lb 14.4 oz (79.8 kg)   SpO2 97%   BMI 29.27 kg/m     Physical Exam Vitals reviewed.  Constitutional:      General: She is not in acute distress.    Appearance: Normal appearance. She is well-developed. She is not diaphoretic.  HENT:      Head: Normocephalic and atraumatic.     Right Ear: Tympanic membrane, ear canal and external ear normal.     Left Ear: Tympanic membrane, ear canal and external ear normal.     Nose: Nose normal.     Mouth/Throat:     Mouth: Mucous membranes are moist.     Pharynx: Oropharynx is clear. No oropharyngeal exudate.  Eyes:     General: No scleral icterus.    Conjunctiva/sclera: Conjunctivae normal.     Pupils: Pupils are equal, round, and reactive to light.  Neck:     Thyroid: No thyromegaly.  Cardiovascular:     Rate and Rhythm: Normal rate and regular rhythm.     Heart sounds: Normal heart sounds. No murmur heard. Pulmonary:     Effort: Pulmonary effort is normal. No respiratory distress.     Breath sounds: Normal breath sounds. No wheezing or rales.  Abdominal:     General: There is no distension.     Palpations: Abdomen is soft.     Tenderness: There is no abdominal tenderness.  Musculoskeletal:        General: No deformity.     Cervical back: Neck supple.     Right lower leg: No edema.     Left lower leg: No edema.  Lymphadenopathy:     Cervical: No cervical adenopathy.  Skin:    General: Skin is warm and dry.     Findings: No rash.  Neurological:     Mental Status: She is alert and oriented to person, place, and time. Mental status is at baseline.     Gait: Gait normal.  Psychiatric:        Mood and Affect: Mood normal.        Behavior: Behavior normal.        Thought Content: Thought content normal.     Most recent functional status assessment:    11/14/2023   10:18 AM  In your present state of health, do you have any difficulty performing the following activities:  Hearing? 0  Vision? 0  Difficulty concentrating or making decisions? 0  Walking or climbing stairs? 0  Dressing or bathing? 0  Doing errands, shopping? 0  Preparing Food and eating ? N  Using the Toilet? N  In the past six months, have you accidently leaked urine? Y  Do you have problems with loss  of bowel control? N  Managing your Medications? N  Managing your Finances? N  Housekeeping or managing your Housekeeping? N   Most recent fall risk assessment:    11/14/2023   10:18 AM  Fall Risk   Falls in the past year? 0  Number falls in past yr: 0  Injury with Fall? 0  Risk for fall due to : No Fall Risks  Follow up Falls prevention discussed;Falls evaluation completed  Most recent depression screenings:    09/07/2023    9:49 AM 04/07/2023   10:33 AM  PHQ 2/9 Scores  PHQ - 2 Score 0 0  PHQ- 9 Score 0 1   Most recent cognitive screening:    11/14/2023   10:21 AM  6CIT Screen  What Year? 0 points  What month? 0 points  What time? 0 points  Count back from 20 0 points  Months in reverse 0 points  Repeat phrase 0 points  Total Score 0 points   Most recent Audit-C alcohol use screening    11/14/2023   10:12 AM  Alcohol Use Disorder Test (AUDIT)  1. How often do you have a drink containing alcohol? 1  2. How many drinks containing alcohol do you have on a typical day when you are drinking? 0  3. How often do you have six or more drinks on one occasion? 0  AUDIT-C Score 1   A score of 3 or more in women, and 4 or more in men indicates increased risk for alcohol abuse, EXCEPT if all of the points are from question 1   No results found.  No results found for any visits on 11/14/23.  Assessment & Plan     Annual wellness visit done today including the all of the following: Reviewed patient's Family Medical History Reviewed and updated list of patient's medical providers Assessment of cognitive impairment was done Assessed patient's functional ability Established a written schedule for health screening services Health Risk Assessent Completed and Reviewed  Exercise Activities and Dietary recommendations  Goals      DIET - EAT MORE FRUITS AND VEGETABLES     DIET - REDUCE SUGAR INTAKE     Recommend to start back on low carbohydrate diet to aid with with  weight loss.      Weight (lb) < 173 lb (78.5 kg)        Immunization History  Administered Date(s) Administered   Fluad Quad(high Dose 65+) 08/20/2019, 10/14/2020, 09/15/2021, 10/28/2022   Fluad Trivalent(High Dose 65+) 11/14/2023   Influenza, High Dose Seasonal PF 10/10/2017   Influenza,inj,Quad PF,6+ Mos 07/14/2013, 08/23/2016   PFIZER(Purple Top)SARS-COV-2 Vaccination 05/21/2020, 06/11/2020   PNEUMOCOCCAL CONJUGATE-20 10/28/2022   Pneumococcal Conjugate-13 10/10/2017   Pneumococcal Polysaccharide-23 08/30/2016   Tdap 06/17/2011    Health Maintenance  Topic Date Due   Zoster Vaccines- Shingrix (1 of 2) Never done   COVID-19 Vaccine (3 - Pfizer risk series) 07/09/2020   DTaP/Tdap/Td (2 - Td or Tdap) 06/16/2021   DEXA SCAN  11/16/2022   MAMMOGRAM  02/13/2024   Medicare Annual Wellness (AWV)  11/13/2024   Colonoscopy  11/16/2027   Pneumonia Vaccine 32+ Years old  Completed   INFLUENZA VACCINE  Completed   Hepatitis C Screening  Completed   HPV VACCINES  Aged Out     Discussed health benefits of physical activity, and encouraged her to engage in regular exercise appropriate for her age and condition.     Problem List Items Addressed This Visit       Musculoskeletal and Integument   Osteopenia   Relevant Orders   VITAMIN D 25 Hydroxy (Vit-D Deficiency, Fractures)     Other   Hypercholesterolemia without hypertriglyceridemia   Relevant Orders   Comprehensive metabolic panel   Lipid panel   Avitaminosis D   Relevant Orders   VITAMIN D 25 Hydroxy (Vit-D Deficiency, Fractures)   Other Visit Diagnoses       Encounter for annual physical  exam    -  Primary     Mixed incontinence       Relevant Orders   Ambulatory referral to Urology     Skin lesion       Relevant Orders   Ambulatory referral to Dermatology          Urinary Incontinence Chronic urinary incontinence, initially stress incontinence exacerbated by coughing, sneezing, and physical activity.  Recently worsened with mixed incontinence following a back injury last spring. Reports loss of bladder control without awareness. Possible neurogenic component.  No prior treatments other than Kegel exercises. Medications for overactive bladder are ineffective for this type. Pelvic floor physical therapy and procedural options through urology discussed. - Refer to urology for further evaluation and procedural options. - Discuss pelvic floor physical therapy.  Chronic Back Pain Chronic back pain with spinal surgery involving pins and rods. Exacerbated last spring due to lifting a heavy object, resulting in temporary loss of bladder control. Managed with muscle relaxers and pain medication by UC. No current numbness or weakness in legs, but persistent sciatica. - Continue current pain management regimen.  Non-healing Skin Lesion Chronic non-healing skin lesion on the thigh, intermittently scaly and itchy. Differential diagnosis includes precancerous lesion or non-melanoma skin cancer. Family history of skin cancer. Biopsy recommended to rule out malignancy. - Refer to dermatology for biopsy and further evaluation.  General Health Maintenance Routine wellness visit. No issues with self-care, medication management, or falls. No depression symptoms. Passed memory test. Recent flu shot administered. Due for shingles vaccine, tetanus booster, and COVID booster. Mammogram due in April. Bone density scan due. - Recommend shingles vaccine at the pharmacy. - Recommend tetanus booster at the pharmacy. - Recommend COVID booster. - Order bone density scan. - Order routine labs including kidney function, liver function, electrolytes, blood glucose, cholesterol, and vitamin D.  Follow-up - Schedule next year's physical. - Follow up with urology and dermatology as referred. - Complete lab work at [facility] or any convenient location.       Return in about 1 year (around 11/13/2024) for CPE, AWV.     Shirlee Latch, MD  [facility] [phone] (phone) [phone] (fax)  [facility]

## 2023-11-14 NOTE — Patient Instructions (Signed)
Consider asking at the pharmacy about the tetanus shot (TDAP) and the shingles shot (Shingrix)  ?

## 2023-11-14 NOTE — Patient Instructions (Addendum)
Monica Delacruz , Thank you for taking time to come for your Medicare Wellness Visit. I appreciate your ongoing commitment to your health goals. Please review the following plan we discussed and let me know if I can assist you in the future.   Referrals/Orders/Follow-Ups/Clinician Recommendations: BONE DENSITY REFERRAL SENT  You have an order for:  []   2D Mammogram  []   3D Mammogram  [x]   Bone Density     Please call for appointment:  [facility]  [address] [place] [address] [phone] [facility] [address] [place], [place] [address] [phone] [facility] [address] Geanie Logan [place], [place] [address] [phone]   Make sure to wear two-piece clothing.  No lotions, powders, or deodorants the day of the appointment. Make sure to bring picture ID and insurance card.  Bring list of medications you are currently taking including any supplements.   Schedule your [place] screening mammogram through MyChart!   Log into your MyChart account.  Go to 'Visit' (or 'Appointments' if on mobile App) --> Schedule an Appointment  Under 'Select a Reason for Visit' choose the Mammogram Screening option.  Complete the pre-visit questions and select the time and place that best fits your schedule.   This is a list of the screening recommended for you and due dates:  Health Maintenance  Topic Date Due   Zoster (Shingles) Vaccine (1 of 2) Never done   COVID-19 Vaccine (3 - Pfizer risk series) 07/09/2020   DTaP/Tdap/Td vaccine (2 - Td or Tdap) 06/16/2021   DEXA scan (bone density measurement)  11/16/2022   Mammogram  02/13/2024   Medicare Annual Wellness Visit  11/13/2024   Colon Cancer Screening  11/16/2027   Pneumonia Vaccine  Completed   Flu Shot  Completed   Hepatitis C Screening  Completed   HPV Vaccine  Aged Out    Advanced directives: (ACP  Link)Information on Advanced Care Planning can be found at [facility] [place] Advance Health Care Directives Advance Health Care Directives ([email or web address]   Next Medicare Annual Wellness Visit scheduled for next year: Yes   11/19/24 @ 9:30 AM IN PERSON

## 2023-11-14 NOTE — Addendum Note (Signed)
Addended by: Erasmo Downer on: 11/14/2023 01:12 PM   Modules accepted: Level of Service

## 2023-11-17 DIAGNOSIS — M8589 Other specified disorders of bone density and structure, multiple sites: Secondary | ICD-10-CM | POA: Diagnosis not present

## 2023-11-17 DIAGNOSIS — E559 Vitamin D deficiency, unspecified: Secondary | ICD-10-CM | POA: Diagnosis not present

## 2023-11-17 DIAGNOSIS — E7800 Pure hypercholesterolemia, unspecified: Secondary | ICD-10-CM | POA: Diagnosis not present

## 2023-11-18 LAB — COMPREHENSIVE METABOLIC PANEL
ALT: 24 [IU]/L (ref 0–32)
AST: 23 [IU]/L (ref 0–40)
Albumin: 4.4 g/dL (ref 3.8–4.8)
Alkaline Phosphatase: 90 [IU]/L (ref 44–121)
BUN/Creatinine Ratio: 22 (ref 12–28)
BUN: 19 mg/dL (ref 8–27)
Bilirubin Total: 0.4 mg/dL (ref 0.0–1.2)
CO2: 22 mmol/L (ref 20–29)
Calcium: 9.2 mg/dL (ref 8.7–10.3)
Chloride: 109 mmol/L — ABNORMAL HIGH (ref 96–106)
Creatinine, Ser: 0.86 mg/dL (ref 0.57–1.00)
Globulin, Total: 2 g/dL (ref 1.5–4.5)
Glucose: 99 mg/dL (ref 70–99)
Potassium: 4.4 mmol/L (ref 3.5–5.2)
Sodium: 148 mmol/L — ABNORMAL HIGH (ref 134–144)
Total Protein: 6.4 g/dL (ref 6.0–8.5)
eGFR: 72 mL/min/{1.73_m2} (ref >59)

## 2023-11-18 LAB — LIPID PANEL
Chol/HDL Ratio: 3.1 {ratio} (ref 0.0–4.4)
Cholesterol, Total: 215 mg/dL — ABNORMAL HIGH (ref 100–199)
HDL: 69 mg/dL (ref >39)
LDL Chol Calc (NIH): 130 mg/dL — ABNORMAL HIGH (ref 0–99)
Triglycerides: 89 mg/dL (ref 0–149)
VLDL Cholesterol Cal: 16 mg/dL (ref 5–40)

## 2023-11-18 LAB — VITAMIN D 25 HYDROXY (VIT D DEFICIENCY, FRACTURES): Vit D, 25-Hydroxy: 52.6 ng/mL (ref 30.0–100.0)

## 2023-11-29 DIAGNOSIS — L570 Actinic keratosis: Secondary | ICD-10-CM | POA: Diagnosis not present

## 2023-12-05 DIAGNOSIS — K080 Exfoliation of teeth due to systemic causes: Secondary | ICD-10-CM | POA: Diagnosis not present

## 2023-12-09 ENCOUNTER — Other Ambulatory Visit: Payer: Self-pay | Admitting: Family Medicine

## 2023-12-11 NOTE — Telephone Encounter (Signed)
Requested Prescriptions  Pending Prescriptions Disp Refills   albuterol (VENTOLIN HFA) 108 (90 Base) MCG/ACT inhaler [Pharmacy Med Name: ALBUTEROL HFA INH (200 PUFFS) 6.7GM] 6.7 g 2    Sig: INHALE 2 PUFFS INTO THE LUNGS EVERY 6 HOURS AS NEEDED FOR WHEEZING OR SHORTNESS OF BREATH     Pulmonology:  B[facility] [employer] 2 Passed - 12/11/2023 11:43 AM      Passed - Last BP in normal range    BP Readings from Last 1 Encounters:  11/14/23 130/84         Passed - Last Heart Rate in normal range    Pulse Readings from Last 1 Encounters:  11/14/23 75         Passed - Valid encounter within last 12 months    Recent Outpatient Visits           3 weeks ago Encounter for annual physical exam   [facility], Marzella Schlein, MD   3 months ago Community acquired pneumonia of right lower lobe of lung   [facility], [place], MD   3 months ago Bronchitis   [facility] [place], Marzella Schlein, MD   8 months ago Lower abdominal pain   [facility] [name], PA-C   1 year ago Encounter for [employer] annual wellness exam   [facility] [name], PA-C       Future Appointments             In 1 month MacDiarmid, Lorin Picket, MD [facility] [place]   In 11 months Bacigalupo, Marzella Schlein, MD [facility]

## 2023-12-25 ENCOUNTER — Other Ambulatory Visit: Payer: Self-pay | Admitting: General Surgery

## 2023-12-25 DIAGNOSIS — Z1231 Encounter for screening mammogram for malignant neoplasm of breast: Secondary | ICD-10-CM

## 2024-01-15 ENCOUNTER — Ambulatory Visit: Payer: Self-pay | Admitting: Urology

## 2024-01-15 ENCOUNTER — Encounter: Payer: Self-pay | Admitting: Urology

## 2024-01-15 VITALS — BP 137/79 | HR 74 | Ht 64.5 in | Wt 173.2 lb

## 2024-01-15 DIAGNOSIS — N3946 Mixed incontinence: Secondary | ICD-10-CM

## 2024-01-15 LAB — URINALYSIS, COMPLETE
Bilirubin, UA: NEGATIVE
Glucose, UA: NEGATIVE
Ketones, UA: NEGATIVE
Nitrite, UA: NEGATIVE
Protein,UA: NEGATIVE
Specific Gravity, UA: 1.02 (ref 1.005–1.030)
Urobilinogen, Ur: 0.2 mg/dL (ref 0.2–1.0)
pH, UA: 7 (ref 5.0–7.5)

## 2024-01-15 LAB — MICROSCOPIC EXAMINATION

## 2024-01-15 MED ORDER — GEMTESA 75 MG PO TABS: 75.0000 mg | ORAL_TABLET | Freq: Every day | ORAL | 3 refills | Status: DC

## 2024-01-15 NOTE — Patient Instructions (Signed)
 Urodynamic Testing: What to Expect  Urodynamic tests are done to look at how well your bladder and urethra are working. The urethra is the part of your body that drains pee (urine) from the bladder. When your kidneys clean your blood, pee is made and stored in your bladder until you feel the urge to go. Peeing requires the nerves and muscles of your bladder and urethra to communicate. This helps you to: Start peeing when your bladder is full. Empty your bladder completely. Control the flow of your pee. Why do I need urodynamic testing? You may need urodynamic testing to help find the cause of any of these problems: Leaking pee (incontinence). Problems starting or stopping the flow of your pee. Pain when peeing. Urinary tract infections that happen often. Not being able to empty your bladder completely. Having strong urges to pee. Having a weak flow of pee. What are the risks? Your health care provider will talk with you about risks. These may include: Soreness. Urge to pee often. Bleeding. Infection. Allergies to medicines or dyes used during the tests. What happens before? Ask about changing or stopping: Any medicines you take. Any vitamins, herbs, or supplements you take. Do not take aspirin or ibuprofen unless you're told to. You may be asked to avoid peeing before the test so that you arrive with a full bladder. Tell a provider about: Any allergies you have. All medicines you are taking. These include vitamins, herbs, eye drops, creams, and over-the-counter medicines. Whether you are pregnant or may be pregnant. What happens during urodynamic testing? You may have more than one urodynamic test. The tests may be done on different days or may all be done on one day. You may be given antibiotics before or after the tests to help prevent infection. You may be given a numbing gel in the urethra before the tests. For your test, your provider may do the  following: Uroflowmetry. This test measures how much you pee and how long it takes. Postvoid residual urine measurement. This test measures how much pee is left in your bladder right after you pee. Cystometric test. This test measures the pressure in your bladder before you pee and as you pee. Leak point pressure measurement. This test may be done during the cystometric test. It measures: The pressure in the bladder when leaking happens. The strength of the bladder muscles. Pressure flow study. This test will show the pressure in the bladder when you feel you have to go and how fast the pee comes out. Electromyogram. This test measures the action of the nerves and muscles of your bladder at the opening of your urethra. Video urodynamic tests. This test uses X-ray or ultrasound machines to take videos or pictures of the bladder while it's filling and emptying. What can I expect after the test? You should be able to go home right away and do your usual activities. You may be told to drink a glass of water every 30 minutes for the first 2 hours after the test. Taking a warm bath or using warm, wet cloths may relieve any soreness near your urethra. Ask when your test results will be ready and how to get them. Talk with your provider about what your results mean. Contact a health care provider if: You have pain. You have blood in your pee. You have a fever or chills. This information is not intended to replace advice given to you by your health care provider. Make sure you discuss any questions you  have with your health care provider. Document Revised: 06/16/2023 Document Reviewed: 06/16/2023 Elsevier Patient Education  2024 Elsevier Inc. Cystoscopy Cystoscopy is a procedure that is used to help diagnose and sometimes treat conditions that affect the lower urinary tract. The lower urinary tract includes the bladder and the urethra. The urethra is the tube that drains urine from the bladder.  Cystoscopy is done using a thin, tube-shaped instrument with a light and camera at the end (cystoscope). The cystoscope may be hard or flexible, depending on the goal of the procedure. The cystoscope is inserted through the urethra, into the bladder. Cystoscopy may be recommended if you have: Urinary tract infections that keep coming back. Blood in the urine (hematuria). An inability to control when you urinate (urinary incontinence) or an overactive bladder. Unusual cells found in a urine sample. A blockage in the urethra, such as a urinary stone. Painful urination. An abnormality in the bladder found during an intravenous pyelogram (IVP) or CT scan. What are the risks? Generally, this is a safe procedure. However, problems may occur, including: Infection. Bleeding.  What happens during the procedure?  You will be given one or more of the following: A medicine to numb the area (local anesthetic). The area around the opening of your urethra will be cleaned. The cystoscope will be passed through your urethra into your bladder. Germ-free (sterile) fluid will flow through the cystoscope to fill your bladder. The fluid will stretch your bladder so that your health care provider can clearly examine your bladder walls. Your doctor will look at the urethra and bladder. The cystoscope will be removed The procedure may vary among health care providers  What can I expect after the procedure? After the procedure, it is common to have: Some soreness or pain in your urethra. Urinary symptoms. These include: Mild pain or burning when you urinate. Pain should stop within a few minutes after you urinate. This may last for up to a few days after the procedure. A small amount of blood in your urine for several days. Feeling like you need to urinate but producing only a small amount of urine. Follow these instructions at home: General instructions Return to your normal activities as told by your health  care provider.  Drink plenty of fluids after the procedure. Keep all follow-up visits as told by your health care provider. This is important. Contact a health care provider if you: Have pain that gets worse or does not get better with medicine, especially pain when you urinate lasting longer than 72 hours after the procedure. Have trouble urinating. Get help right away if you: Have blood clots in your urine. Have a fever or chills. Are unable to urinate. Summary Cystoscopy is a procedure that is used to help diagnose and sometimes treat conditions that affect the lower urinary tract. Cystoscopy is done using a thin, tube-shaped instrument with a light and camera at the end. After the procedure, it is common to have some soreness or pain in your urethra. It is normal to have blood in your urine after the procedure.  If you were prescribed an antibiotic medicine, take it as told by your health care provider.  This information is not intended to replace advice given to you by your health care provider. Make sure you discuss any questions you have with your health care provider. Document Revised: 10/02/2018 Document Reviewed: 10/02/2018 Elsevier Patient Education  2020 [employer].

## 2024-01-15 NOTE — Progress Notes (Signed)
 01/15/2024 10:06 AM   Monica Delacruz 1951/03/13 [id number]  Referring provider: Erasmo Downer, MD [address] [place],  [place] [address]  Chief Complaint  Patient presents with   mixed incontinence    HPI: I was consulted to assist the patient's urinary incontinence.  She can leak with coughing sneezing bending and lifting.  Sometimes she has urge incontinence.  She can leak without awareness.  No bedwetting.  She uses but heavier pad and changes liners 4-5 times a day that can be damp or soaked.  She is quite fastidious.  She voids every 1-2 hours.  Flow was good and she can double void a small amount.  She gets up once or twice at night  She occasionally gets a bladder infection.  She has had low back surgery.  She has had a hysterectomy.  She has failed AZO.  Urine culture was + June 2024  She has had kidney stones treated by partners several years ago.   PMH: Past Medical History:  Diagnosis Date   Back pain    lumbar fracture   Diverticulosis    Headache    Migraine   History of bronchitis    2014   History of colon polyps    History of kidney stones    History of migraine    last one about 2wks ago   Hyperlipidemia    takes Lovasatin daily   Seasonal allergies    takes Claritin daily and Flonase daily    Surgical History: Past Surgical History:  Procedure Laterality Date   ABDOMINAL HYSTERECTOMY  1985   please note pt reported today 10/28/22 that it was in 1995   BACK SURGERY     BREAST BIOPSY Left 02/12/2020   Stereo Bx, X-clip, neg   BREAST BIOPSY WITH RADIO FREQUENCY LOCALIZER Left 03/04/2020   Procedure: BREAST BIOPSY WITH RADIO FREQUENCY LOCALIZER;  Surgeon: Carolan Shiver, MD;  Location: [facility];  Service: General;  Laterality: Left;   COLONOSCOPY     COLONOSCOPY WITH PROPOFOL N/A 11/15/2022   Procedure: COLONOSCOPY WITH PROPOFOL;  Surgeon: Wyline Mood, MD;  Location: [facility];  Service: Gastroenterology;   Laterality: N/A;   CYSTOSCOPY     CYSTOSCOPY W/ URETERAL STENT PLACEMENT Right 10/03/2016   Procedure: CYSTOSCOPY WITH STENT REPLACEMENT;  Surgeon: Vanna Scotland, MD;  Location: [facility];  Service: Urology;  Laterality: Right;   CYSTOSCOPY/RETROGRADE/URETEROSCOPY Right 08/29/2016   Procedure: CYSTOSCOPY/RETROGRADE/stent placement;  Surgeon: Sebastian Ache, MD;  Location: [facility];  Service: Urology;  Laterality: Right;   EXCISION OF TONGUE LESION Left 07/15/2021   Procedure: EXCISION OF MUCOSA LIP LESION;  Surgeon: Vernie Murders, MD;  Location: [facility];  Service: ENT;  Laterality: Left;   FOOT SURGERY     bilateral   JOINT REPLACEMENT Left 2009   knee   LAMINECTOMY WITH POSTERIOR LATERAL ARTHRODESIS LEVEL 4 N/A 07/12/2013   Procedure: Thoracic Ten Through Lumbar Three posterior lateral fusion with instrumentation;  Surgeon: Carmela Hurt, MD;  Location: [facility];  Service: Neurosurgery;  Laterality: N/A;  Thoracic Ten Through Lumbar Three posterior lateral fusion with instrumentation   LITHOTRIPSY     x 2   NASAL SINUS SURGERY     x 2   URETEROSCOPY WITH HOLMIUM LASER LITHOTRIPSY Right 10/03/2016   Procedure: URETEROSCOPY WITH HOLMIUM LASER LITHOTRIPSY;  Surgeon: Vanna Scotland, MD;  Location: [facility];  Service: Urology;  Laterality: Right;    Home Medications:  Allergies as of 01/15/2024  Reactions   Tramadol Diarrhea, Nausea And Vomiting, Nausea Only   Other reaction(s): Vomiting   Aspirin Other (See Comments)   Burning. Burns stomach Other reaction(s): Abdominal Pain, Other (See Comments) Burning.   Morphine And Codeine Itching   Ofloxacin Nausea Only   Oxycodone Hcl    Weird dreams   Simvastatin    GI upset   Sulfa Antibiotics Hives, Itching, Swelling   Adhesive [tape] Other (See Comments)   Blisters skin        Medication List        Accurate as of January 15, 2024 10:06 AM. If you have any questions, ask your nurse or doctor.           STOP taking these medications    butalbital-acetaminophen-caffeine 50-325-40 MG tablet Commonly known as: FIORICET Stopped by: Lorin Picket A Aidynn Polendo   guaiFENesin-codeine 100-10 MG/5ML syrup Stopped by: Lorin Picket A Javeion Cannedy   meloxicam 15 MG tablet Commonly known as: MOBIC Stopped by: Lorin Picket A Druanne Bosques       TAKE these medications    albuterol 108 (90 Base) MCG/ACT inhaler Commonly known as: VENTOLIN HFA INHALE 2 PUFFS INTO THE LUNGS EVERY 6 HOURS AS NEEDED FOR WHEEZING OR SHORTNESS OF BREATH   cetirizine 10 MG tablet Commonly known as: ZYRTEC Take 10 mg by mouth daily.   fluticasone 50 MCG/ACT nasal spray Commonly known as: FLONASE Place 1 spray into both nostrils daily as needed for allergies or rhinitis.   lovastatin 40 MG tablet Commonly known as: MEVACOR Take 1 tablet (40 mg total) by mouth at bedtime.   VITAMIN D PO Take 3 tablets by mouth daily. 600 units per        Allergies:  Allergies  Allergen Reactions   Tramadol Diarrhea, Nausea And Vomiting and Nausea Only    Other reaction(s): Vomiting   Aspirin Other (See Comments)    Burning. Burns stomach Other reaction(s): Abdominal Pain, Other (See Comments) Burning.   Morphine And Codeine Itching   Ofloxacin Nausea Only   Oxycodone Hcl     Weird dreams   Simvastatin     GI upset   Sulfa Antibiotics Hives, Itching and Swelling   Adhesive [Tape] Other (See Comments)    Blisters skin    Family History: Family History  Problem Relation Age of Onset   Hematuria Mother    Diabetes type II Mother    Hypertension Father    Stomach cancer Father    Breast cancer Maternal Grandmother     Social History:  reports that she has never smoked. She has never used smokeless tobacco. She reports current alcohol use. She reports that she does not use drugs.  ROS:                                        Physical Exam: BP 137/79   Pulse 74   Ht 5' 4.5" (1.638 m)   Wt 78.6 kg    BMI 29.27 kg/m   Constitutional:  Alert and oriented, No acute distress. HEENT: [place] AT, moist mucus membranes.  Trachea midline, no masses. Cardiovascular: No clubbing, cyanosis, or edema. Respiratory: Normal respiratory effort, no increased work of breathing. GI: Abdomen is soft, nontender, nondistended, no abdominal masses GU: Pelvic examination was a little bit limited by patient's lack of ability to abduct her thighs.  She had grade 2 hypermobility the bladder neck and a mild positive cough  test.  No significant prolapse Skin: No rashes, bruises or suspicious lesions. Lymph: No cervical or inguinal adenopathy. Neurologic: Grossly intact, no focal deficits, moving all 4 extremities. Psychiatric: Normal mood and affect.  Laboratory Data: Lab Results  Component Value Date   WBC 4.6 11/01/2022   HGB 13.7 11/01/2022   HCT 41.3 11/01/2022   MCV 89 11/01/2022   PLT 257 11/01/2022    Lab Results  Component Value Date   CREATININE 0.86 11/17/2023    No results found for: "PSA"  No results found for: "TESTOSTERONE"  Lab Results  Component Value Date   HGBA1C 5.7 (H) 08/28/2016    Urinalysis    Component Value Date/Time   COLORURINE YELLOW (A) 08/28/2016 2217   APPEARANCEUR Cloudy (A) 11/25/2016 1405   LABSPEC 1.013 08/28/2016 2217   PHURINE 5.0 08/28/2016 2217   GLUCOSEU Negative 11/25/2016 1405   HGBUR 2+ (A) 08/28/2016 2217   BILIRUBINUR Negative 04/07/2023 1046   BILIRUBINUR Negative 11/25/2016 1405   KETONESUR NEGATIVE 08/28/2016 2217   PROTEINUR Negative 04/07/2023 1046   PROTEINUR Negative 11/25/2016 1405   PROTEINUR NEGATIVE 08/28/2016 2217   UROBILINOGEN 0.2 04/07/2023 1046   NITRITE Negative 04/07/2023 1046   NITRITE Negative 11/25/2016 1405   NITRITE POSITIVE (A) 08/28/2016 2217   LEUKOCYTESUR Negative 04/07/2023 1046   LEUKOCYTESUR Negative 11/25/2016 1405    Pertinent Imaging: Urine reviewed and sent for culture.  Chart reviewed.  Urine culture was +  June 2024  Assessment & Plan: Patient has mixed incontinence.  She can leak without awareness.  Role of urodynamics and cystoscopy discussed.  If urine culture is negative and she still has red blood cells in urine next visit she would need to CT scan.  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  Her leakage without awareness could be due to stress incontinence but we will look for evidence of overactive bladder  1. Mixed incontinence (Primary)  - Urinalysis, Complete   No follow-ups on file.  Martina Sinner, MD  [facility] [address] [place], [place] [address] [phone]

## 2024-01-19 ENCOUNTER — Other Ambulatory Visit: Payer: Self-pay

## 2024-01-19 LAB — CULTURE, URINE COMPREHENSIVE

## 2024-01-19 MED ORDER — CIPROFLOXACIN HCL 250 MG PO TABS
250.0000 mg | ORAL_TABLET | Freq: Two times a day (BID) | ORAL | 0 refills | Status: AC
Start: 2024-01-19 — End: 2024-01-26

## 2024-01-20 ENCOUNTER — Other Ambulatory Visit: Payer: Self-pay | Admitting: Physician Assistant

## 2024-01-20 DIAGNOSIS — E7800 Pure hypercholesterolemia, unspecified: Secondary | ICD-10-CM

## 2024-02-14 ENCOUNTER — Encounter

## 2024-02-20 ENCOUNTER — Ambulatory Visit
Admission: RE | Admit: 2024-02-20 | Discharge: 2024-02-20 | Disposition: A | Discharge status: Home / Self Care | Source: Ambulatory Visit | Attending: General Surgery | Admitting: General Surgery

## 2024-02-20 DIAGNOSIS — Z1231 Encounter for screening mammogram for malignant neoplasm of breast: Secondary | ICD-10-CM | POA: Insufficient documentation

## 2024-02-21 ENCOUNTER — Other Ambulatory Visit: Payer: Self-pay | Admitting: General Surgery

## 2024-02-21 DIAGNOSIS — R928 Other abnormal and inconclusive findings on diagnostic imaging of breast: Secondary | ICD-10-CM

## 2024-02-22 DIAGNOSIS — N3946 Mixed incontinence: Secondary | ICD-10-CM | POA: Diagnosis not present

## 2024-02-23 ENCOUNTER — Ambulatory Visit
Admission: RE | Admit: 2024-02-23 | Discharge: 2024-02-23 | Disposition: A | Discharge status: Home / Self Care | Source: Ambulatory Visit | Attending: General Surgery | Admitting: General Surgery

## 2024-02-23 DIAGNOSIS — R928 Other abnormal and inconclusive findings on diagnostic imaging of breast: Secondary | ICD-10-CM | POA: Insufficient documentation

## 2024-02-23 DIAGNOSIS — N6002 Solitary cyst of left breast: Secondary | ICD-10-CM | POA: Diagnosis not present

## 2024-02-23 DIAGNOSIS — R92322 Mammographic fibroglandular density, left breast: Secondary | ICD-10-CM | POA: Diagnosis not present

## 2024-02-23 DIAGNOSIS — N6324 Unspecified lump in the left breast, lower inner quadrant: Secondary | ICD-10-CM | POA: Diagnosis not present

## 2024-02-27 DIAGNOSIS — Z1231 Encounter for screening mammogram for malignant neoplasm of breast: Secondary | ICD-10-CM | POA: Diagnosis not present

## 2024-03-20 DIAGNOSIS — K080 Exfoliation of teeth due to systemic causes: Secondary | ICD-10-CM | POA: Diagnosis not present

## 2024-04-08 ENCOUNTER — Ambulatory Visit: Admitting: Urology

## 2024-04-08 VITALS — BP 162/79 | HR 86 | Ht 64.0 in | Wt 171.0 lb

## 2024-04-08 DIAGNOSIS — N3946 Mixed incontinence: Secondary | ICD-10-CM

## 2024-04-08 LAB — URINALYSIS, COMPLETE
Bilirubin, UA: NEGATIVE
Glucose, UA: NEGATIVE
Ketones, UA: NEGATIVE
Leukocytes,UA: NEGATIVE
Nitrite, UA: NEGATIVE
Protein,UA: NEGATIVE
RBC, UA: NEGATIVE
Specific Gravity, UA: 1.015 (ref 1.005–1.030)
Urobilinogen, Ur: 0.2 mg/dL (ref 0.2–1.0)
pH, UA: 6.5 (ref 5.0–7.5)

## 2024-04-08 LAB — MICROSCOPIC EXAMINATION: Bacteria, UA: NONE SEEN

## 2024-04-08 MED ORDER — GEMTESA 75 MG PO TABS: 75.0000 mg | ORAL_TABLET | Freq: Every day | ORAL | Status: AC

## 2024-04-08 MED ORDER — GEMTESA 75 MG PO TABS: 75.0000 mg | ORAL_TABLET | Freq: Every day | ORAL | 11 refills | Status: DC

## 2024-04-08 NOTE — Progress Notes (Signed)
 04/08/2024 9:54 AM   Monica Delacruz 04-Oct-1951 [id number]  Referring provider: Mazie Speed, MD [address] [place],  [place] [address]  Chief Complaint  Patient presents with   Cysto    HPI: I  was consulted to assist the patient's urinary incontinence.  She can leak with coughing sneezing bending and lifting.  Sometimes she has urge incontinence.  She can leak without awareness.  No bedwetting.  She uses but heavier pad and changes liners 4-5 times a day that can be damp or soaked.  She is quite fastidious.   She voids every 1-2 hours.  Flow was good and she can double void a small amount.  She gets up once or twice at night   She occasionally gets a bladder infection.  She has had low back surgery.  She has had a hysterectomy.  She has failed AZO.  Urine culture was + June 2024    Pelvic examination was a little bit limited by patient's lack of ability to abduct her thighs. She had grade 2 hypermobility the bladder neck and a mild positive cough test. No significant prolapse   Patient has mixed incontinence.  She can leak without awareness.  Role of urodynamics and cystoscopy discussed.  If urine culture is negative and she still has red blood cells in urine next visit she would need to CT scan.  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  Her leakage without awareness could be due to stress incontinence but we will look for evidence of overactive bladder   Today Frequency stable.  Last culture was positive. Clinically not infected today.  She thinks she gets 3 or 4 bladder infections a year She says she is always told she has red blood cells in the urine  Once again pelvic examination was little bit limited by the patient's ability to abduct her thighs.  She had grade 2 hypermobility the bladder neck and a negative cough test after cystoscopy with a moderate cough  Cystoscopy: Patient underwent flexible cystoscopy.  She had mild  cystitis cystica.  Urothelium and trigone otherwise normal.  Urine was clear.  Urethra normal  Urinalysis negative for blood and bacteria  During urodynamics patient voided 97 mL.  Maximal flow was 7 mL/s.  Pattern was a little bit prolonged.  Residual was a few milliliters.  Maximum bladder capacity was 483 mL.  Bladder was stable.  Cough leak point pressure at 200 mL was 68 cm of water with moderate severe leakage.  Her Valsalva leak point pressure was 44 cm of water with mild leakage at the same volume.  Her cough leak point pressure at 483 mL was 76 cm water with severe leakage.  Her Valsalva leak point pressure was 28 cm water with mild leakage.  During voiding she voided 420 mL.  Maximum flow was 19 mL/s maximum voiding pressure 4 cm water.  Residual was 63 mL.  EMG activity within normal limits.  No fluoroscopy due to technical issues.  The details of the urodynamics are signed and dictated    PMH: Past Medical History:  Diagnosis Date   Back pain    lumbar fracture   Diverticulosis    Headache    Migraine   History of bronchitis    2014   History of colon polyps    History of kidney stones    History of migraine    last one about 2wks ago   Hyperlipidemia  takes Lovasatin daily   Seasonal allergies    takes Claritin  daily and Flonase  daily    Surgical History: Past Surgical History:  Procedure Laterality Date   ABDOMINAL HYSTERECTOMY  1985   please note pt reported today 10/28/22 that it was in 1995   BACK SURGERY     BREAST BIOPSY Left 02/12/2020   Stereo Bx, X-clip, neg   BREAST BIOPSY WITH RADIO FREQUENCY LOCALIZER Left 03/04/2020   Procedure: BREAST BIOPSY WITH RADIO FREQUENCY LOCALIZER;  Surgeon: Eldred Grego, MD;  Location: [facility];  Service: General;  Laterality: Left;   COLONOSCOPY     COLONOSCOPY WITH PROPOFOL  N/A 11/15/2022   Procedure: COLONOSCOPY WITH PROPOFOL ;  Surgeon: Luke Salaam, MD;  Location: [facility]COPY;  Service: Gastroenterology;   Laterality: N/A;   CYSTOSCOPY     CYSTOSCOPY W/ URETERAL STENT PLACEMENT Right 10/03/2016   Procedure: CYSTOSCOPY WITH STENT REPLACEMENT;  Surgeon: Dustin Gimenez, MD;  Location: [facility];  Service: Urology;  Laterality: Right;   CYSTOSCOPY/RETROGRADE/URETEROSCOPY Right 08/29/2016   Procedure: CYSTOSCOPY/RETROGRADE/stent placement;  Surgeon: Osborn Blaze, MD;  Location: [facility];  Service: Urology;  Laterality: Right;   EXCISION OF TONGUE LESION Left 07/15/2021   Procedure: EXCISION OF MUCOSA LIP LESION;  Surgeon: Mellody Sprout, MD;  Location: [facility];  Service: ENT;  Laterality: Left;   FOOT SURGERY     bilateral   JOINT REPLACEMENT Left 2009   knee   LAMINECTOMY WITH POSTERIOR LATERAL ARTHRODESIS LEVEL 4 N/A 07/12/2013   Procedure: Thoracic Ten Through Lumbar Three posterior lateral fusion with instrumentation;  Surgeon: Pasty Bongo, MD;  Location: [facility];  Service: Neurosurgery;  Laterality: N/A;  Thoracic Ten Through Lumbar Three posterior lateral fusion with instrumentation   LITHOTRIPSY     x 2   NASAL SINUS SURGERY     x 2   URETEROSCOPY WITH HOLMIUM LASER LITHOTRIPSY Right 10/03/2016   Procedure: URETEROSCOPY WITH HOLMIUM LASER LITHOTRIPSY;  Surgeon: Dustin Gimenez, MD;  Location: [facility];  Service: Urology;  Laterality: Right;    Home Medications:  Allergies as of 04/08/2024       Reactions   Tramadol Diarrhea, Nausea And Vomiting, Nausea Only   Other reaction(s): Vomiting   Aspirin Other (See Comments)   Burning. Burns stomach Other reaction(s): Abdominal Pain, Other (See Comments) Burning.   Morphine  And Codeine  Itching   Ofloxacin Nausea Only   Oxycodone  Hcl    Weird dreams   Simvastatin     GI upset   Sulfa Antibiotics Hives, Itching, Swelling   Adhesive [tape] Other (See Comments)   Blisters skin        Medication List        Accurate as of April 08, 2024  9:54 AM. If you have any questions, ask your nurse or doctor.           albuterol  108 (90 Base) MCG/ACT inhaler Commonly known as: VENTOLIN  HFA INHALE 2 PUFFS INTO THE LUNGS EVERY 6 HOURS AS NEEDED FOR WHEEZING OR SHORTNESS OF BREATH   cetirizine 10 MG tablet Commonly known as: ZYRTEC Take 10 mg by mouth daily.   fluticasone  50 MCG/ACT nasal spray Commonly known as: FLONASE  Place 1 spray into both nostrils daily as needed for allergies or rhinitis.   lovastatin  40 MG tablet Commonly known as: MEVACOR  Take 1 tablet (40 mg total) by mouth at bedtime.   VITAMIN D  PO Take 3 tablets by mouth daily. 600 units per        Allergies:  Allergies  Allergen Reactions   Tramadol Diarrhea, Nausea And Vomiting and Nausea Only    Other reaction(s): Vomiting   Aspirin Other (See Comments)    Burning. Burns stomach Other reaction(s): Abdominal Pain, Other (See Comments) Burning.   Morphine  And Codeine  Itching   Ofloxacin Nausea Only   Oxycodone  Hcl     Weird dreams   Simvastatin      GI upset   Sulfa Antibiotics Hives, Itching and Swelling   Adhesive [Tape] Other (See Comments)    Blisters skin    Family History: Family History  Problem Relation Age of Onset   Hematuria Mother    Diabetes type II Mother    Hypertension Father    Stomach cancer Father    Breast cancer Maternal Grandmother     Social History:  reports that she has never smoked. She has never used smokeless tobacco. She reports current alcohol use. She reports that she does not use drugs.  ROS:                                        Physical Exam: There were no vitals taken for this visit.  Constitutional:  Alert and oriented, No acute distress. HEENT: [employer] AT, moist mucus membranes.  Trachea midline, no masses. .  Laboratory Data: Lab Results  Component Value Date   WBC 4.6 11/01/2022   HGB 13.7 11/01/2022   HCT 41.3 11/01/2022   MCV 89 11/01/2022   PLT 257 11/01/2022    Lab Results  Component Value Date   CREATININE 0.86 11/17/2023     No results found for: PSA  No results found for: TESTOSTERONE  Lab Results  Component Value Date   HGBA1C 5.7 (H) 08/28/2016    Urinalysis    Component Value Date/Time   COLORURINE YELLOW (A) 08/28/2016 2217   APPEARANCEUR Hazy (A) 01/15/2024 0937   LABSPEC 1.013 08/28/2016 2217   PHURINE 5.0 08/28/2016 2217   GLUCOSEU Negative 01/15/2024 0937   HGBUR 2+ (A) 08/28/2016 2217   BILIRUBINUR Negative 01/15/2024 0937   KETONESUR NEGATIVE 08/28/2016 2217   PROTEINUR Negative 01/15/2024 0937   PROTEINUR NEGATIVE 08/28/2016 2217   UROBILINOGEN 0.2 04/07/2023 1046   NITRITE Negative 01/15/2024 0937   NITRITE POSITIVE (A) 08/28/2016 2217   LEUKOCYTESUR 1+ (A) 01/15/2024 0937    Pertinent Imaging: Urine reviewed and sent for culture  Assessment & Plan: The patient has mixed incontinence.  It appears she primarily has stress incontinence likely the leakage without awareness is due to the stress component.  Having said that I would like to try to help her with medical and behavioral therapy.  Physical therapy offered.  If she does not reach her treatment goal should be treated with a bulking agent or sling.  It would probably be better to do the bulking agent with her asleep because of her lack of mobility noted above.  No blood in urine today.  I did not see urinalysis in the past with significant red blood cells.  I will keep an eye on this.  No CT scan ordered.  She understands that I may start her on urinary prophylaxis in the future for recurrent UTIs depending on how she does.  Patient would like to try 2 medications and then discuss other options if they fail.  She held off from physical therapy.  Will keep an eye out for UTIs and microscopic hematuria.  1. Mixed incontinence (Primary)  - Urinalysis, Complete   No follow-ups on file.  Devorah Fonder, MD  [facility] [address], [address] [place], [place] [address] [phone]

## 2024-04-12 LAB — CULTURE, URINE COMPREHENSIVE

## 2024-05-07 ENCOUNTER — Other Ambulatory Visit: Payer: Self-pay | Admitting: Family Medicine

## 2024-05-07 DIAGNOSIS — E7800 Pure hypercholesterolemia, unspecified: Secondary | ICD-10-CM

## 2024-05-07 NOTE — Telephone Encounter (Unsigned)
 Copied from CRM [id number]. Topic: Clinical - Medication Refill >> May 07, 2024  9:46 AM Fonda T wrote: Medication: lovastatin  (MEVACOR ) 40 MG tablet  Has the patient contacted their pharmacy? Yes, advised patient to contact office for refills (Agent: If no, request that the patient contact the pharmacy for the refill. If patient does not wish to contact the pharmacy document the reason why and proceed with request.) (Agent: If yes, when and what did the pharmacy advise?)  This is the patient's preferred pharmacy:  [id number] [place], [place] - [address] AT [facility] OF [address] [place] [address] Phone: [phone] Fax: [phone]  Is this the correct pharmacy for this prescription? Yes If no, delete pharmacy and type the correct one.   Has the prescription been filled recently? Yes  Is the patient out of the medication? Yes  Has the patient been seen for an appointment in the last year OR does the patient have an upcoming appointment? Yes  Can we respond through MyChart? No, patient prefers call  Agent: Please be advised that Rx refills may take up to 3 business days. We ask that you follow-up with your pharmacy.

## 2024-05-08 NOTE — Telephone Encounter (Signed)
 Requested medication (s) are due for refill today: yes  Requested medication (s) are on the active medication list: yes  Last refill:  04/07/23 #90 3 RF  Future visit scheduled: yes  Notes to clinic:  last ordered by L. Drubel PA   Requested Prescriptions  Pending Prescriptions Disp Refills   lovastatin  (MEVACOR ) 40 MG tablet 90 tablet 3    Sig: Take 1 tablet (40 mg total) by mouth at bedtime.     Cardiovascular:  Antilipid - Statins 2 Failed - 05/08/2024  3:01 PM      Failed - Valid encounter within last 12 months    Recent Outpatient Visits   None     Future Appointments             In 1 month MacDiarmid, Glendia, MD [facility] [place]   In 6 months Bacigalupo, Jon HERO, MD [facility]   In 8 months MacDiarmid, Glendia, MD [facility] [place]            Failed - Lipid Panel in normal range within the last 12 months    Cholesterol, Total  Date Value Ref Range Status  11/17/2023 215 (H) 100 - 199 mg/dL Final   LDL Cholesterol (Calc)  Date Value Ref Range Status  10/20/2017 108 (H) mg/dL (calc) Final    Comment:    Reference range: <100 . Desirable range <100 mg/dL for primary prevention;   <70 mg/dL for patients with CHD or diabetic patients  with > or = 2 CHD risk factors. SABRA LDL-C is now calculated using the Martin-Hopkins  calculation, which is a validated novel method providing  better accuracy than the Friedewald equation in the  estimation of LDL-C.  Gladis APPLETHWAITE et al. SANDREA. 7986;689(80): 2061-2068  (http://education.[facility].com/faq/FAQ164)    LDL Chol Calc (NIH)  Date Value Ref Range Status  11/17/2023 130 (H) 0 - 99 mg/dL Final   HDL  Date Value Ref Range Status  11/17/2023 69 >39 mg/dL Final   Triglycerides  Date Value Ref Range Status  11/17/2023 89 0 - 149 mg/dL Final         Passed - Cr in normal range and within 360 days    Creat  Date Value Ref Range Status  10/20/2017 0.87  0.50 - 0.99 mg/dL Final    Comment:    For patients >64 years of age, the reference limit for Creatinine is approximately 13% higher for people identified as African-American. .    Creatinine, Ser  Date Value Ref Range Status  11/17/2023 0.86 0.57 - 1.00 mg/dL Final         Passed - Patient is not pregnant

## 2024-05-10 MED ORDER — LOVASTATIN 40 MG PO TABS: 40.0000 mg | ORAL_TABLET | Freq: Every day | ORAL | 2 refills | Status: AC

## 2024-05-30 ENCOUNTER — Ambulatory Visit
Admission: RE | Admit: 2024-05-30 | Discharge: 2024-05-30 | Disposition: A | Discharge status: Home / Self Care | Source: Ambulatory Visit | Attending: Family Medicine | Admitting: Family Medicine

## 2024-05-30 DIAGNOSIS — Z780 Asymptomatic menopausal state: Secondary | ICD-10-CM | POA: Diagnosis not present

## 2024-05-30 DIAGNOSIS — M8589 Other specified disorders of bone density and structure, multiple sites: Secondary | ICD-10-CM | POA: Diagnosis not present

## 2024-06-04 ENCOUNTER — Ambulatory Visit: Payer: Self-pay | Admitting: Family Medicine

## 2024-06-05 DIAGNOSIS — K080 Exfoliation of teeth due to systemic causes: Secondary | ICD-10-CM | POA: Diagnosis not present

## 2024-07-01 ENCOUNTER — Ambulatory Visit (INDEPENDENT_AMBULATORY_CARE_PROVIDER_SITE_OTHER): Admitting: Urology

## 2024-07-01 VITALS — BP 160/74 | HR 80 | Ht 64.0 in | Wt 171.0 lb

## 2024-07-01 DIAGNOSIS — N3946 Mixed incontinence: Secondary | ICD-10-CM

## 2024-07-01 LAB — MICROSCOPIC EXAMINATION: Epithelial Cells (non renal): >10 /HPF — AB (ref 0–10)

## 2024-07-01 LAB — URINALYSIS, COMPLETE
Bilirubin, UA: NEGATIVE
Glucose, UA: NEGATIVE
Ketones, UA: NEGATIVE
Leukocytes,UA: NEGATIVE
Nitrite, UA: NEGATIVE
Protein,UA: NEGATIVE
RBC, UA: NEGATIVE
Specific Gravity, UA: 1.02 (ref 1.005–1.030)
Urobilinogen, Ur: 0.2 mg/dL (ref 0.2–1.0)
pH, UA: 6.5 (ref 5.0–7.5)

## 2024-07-01 MED ORDER — MIRABEGRON ER 50 MG PO TB24: 50.0000 mg | ORAL_TABLET | Freq: Every day | ORAL | 11 refills | Status: DC

## 2024-07-01 NOTE — Progress Notes (Signed)
 07/01/2024 9:40 AM   Monica Delacruz 09/27/1951 [id number]  Referring provider: Myrla Jon HERO, MD [address] [place],  [place] [address]  Chief Complaint  Patient presents with   Follow-up    HPI: I  was consulted to assist the patient's urinary incontinence.  She can leak with coughing sneezing bending and lifting.  Sometimes she has urge incontinence.  She can leak without awareness.  No bedwetting.  She uses but heavier pad and changes liners 4-5 times a day that can be damp or soaked.  She is quite fastidious.   She voids every 1-2 hours.  Flow was good and she can double void a small amount.  She gets up once or twice at night   She occasionally gets a bladder infection.  She has had low back surgery.  She has had a hysterectomy.  She has failed AZO.  Urine culture was + June 2024    Pelvic examination was a little bit limited by patient's lack of ability to abduct her thighs. She had grade 2 hypermobility the bladder neck and a mild positive cough test. No significant prolapse    Patient has mixed incontinence.  She can leak without awareness.  Role of urodynamics and cystoscopy discussed.  If urine culture is negative and she still has red blood cells in urine next visit she would need to CT scan.  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  Her leakage without awareness could be due to stress incontinence but we will look for evidence of overactive bladder    Last culture was positive. Clinically not infected today.  She thinks she gets 3 or 4 bladder infections a year She says she is always told she has red blood cells in the urine   Once again pelvic examination was little bit limited by the patient's ability to abduct her thighs.  She had grade 2 hypermobility the bladder neck and a negative cough test after cystoscopy with a moderate cough   Cystoscopy: Patient underwent flexible cystoscopy.  She had mild cystitis cystica.   Urothelium and trigone otherwise normal.  Urine was clear.  Urethra normal   Urinalysis negative for blood and bacteria   During urodynamics patient voided 97 mL.  Maximal flow was 7 mL/s.  Pattern was a little bit prolonged.  Residual was a few milliliters.  Maximum bladder capacity was 483 mL.  Bladder was stable.  Cough leak point pressure at 200 mL was 68 cm of water with moderate severe leakage.  Her Valsalva leak point pressure was 44 cm of water with mild leakage at the same volume.  Her cough leak point pressure at 483 mL was 76 cm water with severe leakage.  Her Valsalva leak point pressure was 28 cm water with mild leakage.  During voiding she voided 420 mL.  Maximum flow was 19 mL/s maximum voiding pressure 4 cm water.  Residual was 63 mL.  EMG activity within normal limits.  No fluoroscopy due to technical issues.    The patient has mixed incontinence.  It appears she primarily has stress incontinence likely the leakage without awareness is due to the stress component.  Having said that I would like to try to help her with medical and behavioral therapy.  Physical therapy offered.  If she does not reach her treatment goal should be treated with a bulking agent or sling.  It would probably be better to do the bulking agent with  her asleep because of her lack of mobility noted above.  No blood in urine today.  I did not see urinalysis in the past with significant red blood cells.  I will keep an eye on this.  No CT scan ordered.  She understands that I may start her on urinary prophylaxis in the future for recurrent UTIs depending on how she does.   Patient would like to try 2 medications and then discuss other options if they fail.  She held off from physical therapy.  Will keep an eye out for UTIs and microscopic hematuria.  Today Patient experienced mild improvement in incontinence.  Clinically not infected.  Frequency stable.  She did not fill the prescription because of $400   PMH: Past  Medical History:  Diagnosis Date   Back pain    lumbar fracture   Diverticulosis    Headache    Migraine   History of bronchitis    2014   History of colon polyps    History of kidney stones    History of migraine    last one about 2wks ago   Hyperlipidemia    takes Lovasatin daily   Seasonal allergies    takes Claritin  daily and Flonase  daily    Surgical History: Past Surgical History:  Procedure Laterality Date   ABDOMINAL HYSTERECTOMY  1985   please note pt reported today 10/28/22 that it was in 1995   BACK SURGERY     BREAST BIOPSY Left 02/12/2020   Stereo Bx, X-clip, neg   BREAST BIOPSY WITH RADIO FREQUENCY LOCALIZER Left 03/04/2020   Procedure: BREAST BIOPSY WITH RADIO FREQUENCY LOCALIZER;  Surgeon: Rodolph Romano, MD;  Location: [facility] ORS;  Service: General;  Laterality: Left;   COLONOSCOPY     COLONOSCOPY WITH PROPOFOL  N/A 11/15/2022   Procedure: COLONOSCOPY WITH PROPOFOL ;  Surgeon: Therisa Bi, MD;  Location: [facility] ENDOSCOPY;  Service: Gastroenterology;  Laterality: N/A;   CYSTOSCOPY     CYSTOSCOPY W/ URETERAL STENT PLACEMENT Right 10/03/2016   Procedure: CYSTOSCOPY WITH STENT REPLACEMENT;  Surgeon: Rosina Riis, MD;  Location: [facility] ORS;  Service: Urology;  Laterality: Right;   CYSTOSCOPY/RETROGRADE/URETEROSCOPY Right 08/29/2016   Procedure: CYSTOSCOPY/RETROGRADE/stent placement;  Surgeon: Ricardo Likens, MD;  Location: [facility] ORS;  Service: Urology;  Laterality: Right;   EXCISION OF TONGUE LESION Left 07/15/2021   Procedure: EXCISION OF MUCOSA LIP LESION;  Surgeon: Edda Mt, MD;  [facility];  Service: ENT;  Laterality: Left;   FOOT SURGERY     bilateral   JOINT REPLACEMENT Left 2009   knee   LAMINECTOMY WITH POSTERIOR LATERAL ARTHRODESIS LEVEL 4 N/A 07/12/2013   Procedure: Thoracic Ten Through Lumbar Three posterior lateral fusion with instrumentation;  Surgeon: Rockey LITTIE Peru, MD;  Location: [facility];  Service: Neurosurgery;   Laterality: N/A;  Thoracic Ten Through Lumbar Three posterior lateral fusion with instrumentation   LITHOTRIPSY     x 2   NASAL SINUS SURGERY     x 2   URETEROSCOPY WITH HOLMIUM LASER LITHOTRIPSY Right 10/03/2016   Procedure: URETEROSCOPY WITH HOLMIUM LASER LITHOTRIPSY;  Surgeon: Rosina Riis, MD;  Location: [facility] ORS;  Service: Urology;  Laterality: Right;    Home Medications:  Allergies as of 07/01/2024       Reactions   Tramadol Diarrhea, Nausea And Vomiting, Nausea Only   Other reaction(s): Vomiting   Aspirin Other (See Comments)   Burning. Burns stomach Other reaction(s): Abdominal Pain, Other (See Comments) Burning.   Morphine  And Codeine   Itching   Ofloxacin Nausea Only   Oxycodone  Hcl    Weird dreams   Simvastatin     GI upset   Sulfa Antibiotics Hives, Itching, Swelling   Adhesive [tape] Other (See Comments)   Blisters skin        Medication List        Accurate as of July 01, 2024  9:40 AM. If you have any questions, ask your nurse or doctor.          albuterol  108 (90 Base) MCG/ACT inhaler Commonly known as: VENTOLIN  HFA INHALE 2 PUFFS INTO THE LUNGS EVERY 6 HOURS AS NEEDED FOR WHEEZING OR SHORTNESS OF BREATH   cetirizine 10 MG tablet Commonly known as: ZYRTEC Take 10 mg by mouth daily.   fluticasone  50 MCG/ACT nasal spray Commonly known as: FLONASE  Place 1 spray into both nostrils daily as needed for allergies or rhinitis.   Gemtesa  75 MG Tabs Generic drug: Vibegron  Take 1 tablet (75 mg total) by mouth daily.   lovastatin  40 MG tablet Commonly known as: MEVACOR  Take 1 tablet (40 mg total) by mouth at bedtime.   VITAMIN D  PO Take 3 tablets by mouth daily. 600 units per        Allergies:  Allergies  Allergen Reactions   Tramadol Diarrhea, Nausea And Vomiting and Nausea Only    Other reaction(s): Vomiting   Aspirin Other (See Comments)    Burning. Burns stomach Other reaction(s): Abdominal Pain, Other (See Comments) Burning.    Morphine  And Codeine  Itching   Ofloxacin Nausea Only   Oxycodone  Hcl     Weird dreams   Simvastatin      GI upset   Sulfa Antibiotics Hives, Itching and Swelling   Adhesive [Tape] Other (See Comments)    Blisters skin    Family History: Family History  Problem Relation Age of Onset   Hematuria Mother    Diabetes type II Mother    Hypertension Father    Stomach cancer Father    Breast cancer Maternal Grandmother     Social History:  reports that she has never smoked. She has never used smokeless tobacco. She reports current alcohol use. She reports that she does not use drugs.  ROS:                                        Physical Exam: There were no vitals taken for this visit.  Constitutional:  Alert and oriented, No acute distress. HEENT: [place] AT, moist mucus membranes.  Trachea midline, no masses.   Laboratory Data: Lab Results  Component Value Date   WBC 4.6 11/01/2022   HGB 13.7 11/01/2022   HCT 41.3 11/01/2022   MCV 89 11/01/2022   PLT 257 11/01/2022    Lab Results  Component Value Date   CREATININE 0.86 11/17/2023    No results found for: PSA  No results found for: TESTOSTERONE  Lab Results  Component Value Date   HGBA1C 5.7 (H) 08/28/2016    Urinalysis    Component Value Date/Time   COLORURINE YELLOW (A) 08/28/2016 2217   APPEARANCEUR Clear 04/08/2024 0949   LABSPEC 1.013 08/28/2016 2217   PHURINE 5.0 08/28/2016 2217   GLUCOSEU Negative 04/08/2024 0949   HGBUR 2+ (A) 08/28/2016 2217   BILIRUBINUR Negative 04/08/2024 0949   KETONESUR NEGATIVE 08/28/2016 2217   PROTEINUR Negative 04/08/2024 0949   PROTEINUR NEGATIVE 08/28/2016 2217  UROBILINOGEN 0.2 04/07/2023 1046   NITRITE Negative 04/08/2024 0949   NITRITE POSITIVE (A) 08/28/2016 2217   LEUKOCYTESUR Negative 04/08/2024 0949    Pertinent Imaging: Urine reviewed  Assessment & Plan: Reassess in 6 weeks on Myrbetriq .  Again patient does not want surgery if she  can help it.  No blood in urine  1. Mixed incontinence (Primary)  - Urinalysis, Complete   No follow-ups on file.  Glendia DELENA Elizabeth, MD  [facility] [address] [place], [place] [address] [phone]

## 2024-07-03 MED ORDER — OXYBUTYNIN CHLORIDE ER 10 MG PO TB24
10.0000 mg | ORAL_TABLET | Freq: Every day | ORAL | 11 refills | Status: AC
Start: 2024-07-03 — End: ?

## 2024-07-03 NOTE — Addendum Note (Signed)
 Addended byBETHA CORIE PLATER on: 07/03/2024 09:26 AM   Modules accepted: Orders

## 2024-07-04 LAB — CULTURE, URINE COMPREHENSIVE

## 2024-07-29 ENCOUNTER — Encounter: Payer: Self-pay | Admitting: Family Medicine

## 2024-07-29 ENCOUNTER — Ambulatory Visit: Admitting: Family Medicine

## 2024-07-29 VITALS — BP 126/87 | HR 83 | Ht 64.0 in | Wt 175.1 lb

## 2024-07-29 DIAGNOSIS — Z6830 Body mass index (BMI) 30.0-30.9, adult: Secondary | ICD-10-CM | POA: Diagnosis not present

## 2024-07-29 DIAGNOSIS — J0100 Acute maxillary sinusitis, unspecified: Secondary | ICD-10-CM

## 2024-07-29 DIAGNOSIS — E66811 Obesity, class 1: Secondary | ICD-10-CM

## 2024-07-29 MED ORDER — NALTREXONE HCL 50 MG PO TABS: 25.0000 mg | ORAL_TABLET | Freq: Every day | ORAL | 2 refills | Status: AC

## 2024-07-29 MED ORDER — BUPROPION HCL ER (XL) 150 MG PO TB24: 150.0000 mg | ORAL_TABLET | Freq: Every day | ORAL | 2 refills | Status: DC

## 2024-07-29 MED ORDER — AMOXICILLIN-POT CLAVULANATE 875-125 MG PO TABS
1.0000 | ORAL_TABLET | Freq: Two times a day (BID) | ORAL | 0 refills | Status: AC
Start: 2024-07-29 — End: 2024-08-05

## 2024-07-29 NOTE — Progress Notes (Signed)
 Acute visit   Patient: Monica Delacruz   DOB: Aug 21, 1951   73 y.o. Female  MRN: [id number] PCP: Myrla Jon HERO, MD   Chief Complaint  Patient presents with   Facial Pain    Facial pain, tooth pain and ear pain on right side X 1 week. Associated with green phlegm. Reports it was clear before then associated with head congestion X 1 week. Home covid test taken and it was negative. No fever. Taking mucinex , allergy medicine, cough medicine, dayquil and nyquil, and tylenol    Subjective    Discussed the use of AI scribe software for clinical note transcription with the patient, who gave verbal consent to proceed.  History of Present Illness   Monica Delacruz is a 73 year old female who presents with symptoms of a sinus infection.  She has experienced sinus congestion for two weeks, with green nasal discharge on the right side, sinus tenderness, ear pain, and dental pain. Initially, congestion was on the left side but has shifted to the right. She uses over-the-counter treatments including ibuprofen, allergy pills, Robitussin DM, Tylenol , and a sinus medication with Mucinex , along with Flonase  for drainage, without significant improvement. She attempted leftover antibiotics from a past knee surgery, which were ineffective. She is allergic to sulfa antibiotics. She regularly takes Tylenol  for arthritis in her hands and feet. She feels unusually fatigued, especially after caring for her great-grandchild and visiting a pumpkin patch. She is unsure about having a fever due to regular Tylenol  use.       Review of Systems  Objective    BP 126/87 (BP Location: Left Arm, Patient Position: Sitting)   Pulse 83   Ht 5' 4 (1.626 m)   Wt 175 lb 1.6 oz (79.4 kg)   SpO2 97%   BMI 30.06 kg/m   Physical Exam Vitals reviewed.  Constitutional:      General: She is not in acute distress.    Appearance: Normal appearance. She is well-developed. She is not diaphoretic.  HENT:     Head:  Normocephalic and atraumatic.     Right Ear: Tympanic membrane, ear canal and external ear normal.     Left Ear: Tympanic membrane, ear canal and external ear normal.     Nose: Congestion present.     Comments: TTP over R maxilla    Mouth/Throat:     Mouth: Mucous membranes are moist.     Pharynx: Oropharynx is clear. No oropharyngeal exudate.  Eyes:     General: No scleral icterus.    Conjunctiva/sclera: Conjunctivae normal.     Pupils: Pupils are equal, round, and reactive to light.  Cardiovascular:     Rate and Rhythm: Normal rate and regular rhythm.     Pulses: Normal pulses.     Heart sounds: Normal heart sounds. No murmur heard. Pulmonary:     Effort: Pulmonary effort is normal. No respiratory distress.     Breath sounds: Normal breath sounds. No wheezing or rales.  Musculoskeletal:     Cervical back: Neck supple.     Right lower leg: No edema.     Left lower leg: No edema.  Lymphadenopathy:     Cervical: No cervical adenopathy.  Skin:    General: Skin is warm and dry.     Findings: No rash.  Neurological:     Mental Status: She is alert.       No results found for any visits on 07/29/24.  Assessment & Plan  Problem List Items Addressed This Visit   None Visit Diagnoses       Acute non-recurrent maxillary sinusitis    -  Primary   Relevant Medications   amoxicillin -clavulanate (AUGMENTIN ) 875-125 MG tablet     Class 1 obesity with serious comorbidity and body mass index (BMI) of 30.0 to 30.9 in adult, unspecified obesity type               Acute right maxillary sinusitis Acute bacterial sinusitis with symptoms persisting for over two weeks, including right-sided facial tenderness, green nasal discharge, referred ear pain, and dental pain. Previous self-treatment with over-the-counter medications and an inappropriate antibiotic was ineffective. - Prescribe Augmentin  twice daily for one week - Continue using Mucinex  and Flonase  to aid sinus drainage -  Send prescription to [facility] in [place]  Obesity Gradual weight gain with difficulty controlling appetite. Previous use of phentermine  noted, but not recommended due to age-related risks, including increased blood pressure and cardiac strain. Discussed alternative weight management options, including Contrave, which targets cravings and is composed of Wellbutrin and naltrexone. Wellbutrin provides energy and naltrexone reduces cravings, both contributing to weight loss. 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       Meds ordered this encounter  Medications   amoxicillin -clavulanate (AUGMENTIN ) 875-125 MG tablet    Sig: Take 1 tablet by mouth 2 (two) times daily for 7 days.    Dispense:  14 tablet    Refill:  0   buPROPion (WELLBUTRIN XL) 150 MG 24 hr tablet    Sig: Take 1 tablet (150 mg total) by mouth daily.    Dispense:  30 tablet    Refill:  2   naltrexone (DEPADE) 50 MG tablet    Sig: Take 0.5 tablets (25 mg total) by mouth daily.    Dispense:  15 tablet    Refill:  2     Return in about 3 months (around 10/29/2024) for as scheduled.      Jon Eva, MD  [facility] [phone] (phone) [phone] (fax)  [facility]

## 2024-08-07 ENCOUNTER — Ambulatory Visit: Payer: Self-pay

## 2024-08-07 ENCOUNTER — Other Ambulatory Visit: Payer: Self-pay | Admitting: Family Medicine

## 2024-08-07 MED ORDER — FLUCONAZOLE 150 MG PO TABS
150.0000 mg | ORAL_TABLET | Freq: Once | ORAL | 0 refills | Status: AC
Start: 2024-08-07 — End: 2024-08-07

## 2024-08-07 NOTE — Telephone Encounter (Signed)
 Patient advised. Verbalized understanding

## 2024-08-07 NOTE — Telephone Encounter (Signed)
 FYI Only or Action Required?: Action required by provider: Request for Diflucan  for yeast infection s/p antibiotic course. Pharmacy confirmed.  Patient was last seen in primary care on 07/29/2024 by Myrla Jon HERO, MD.  Called Nurse Triage reporting Vaginitis.  Symptoms began several days ago.  Interventions attempted: Rest, hydration, or home remedies.  Symptoms are: gradually worsening.  Triage Disposition: Home Care  Patient/caregiver understands and will follow disposition?: No, wishes to speak with PCP  Copied from CRM [id number]. Topic: Clinical - Medication Question >> Aug 07, 2024 10:05 AM Alfonso HERO wrote: Reason for CRM: pt calling because the prescribed antibiotic has given her a yeast infection and she is needing some diflucan  called over to her pharmacy Reason for Disposition  [1] Symptoms of a yeast infection (i.e., itchy, white discharge, not bad smelling) AND [2] feels like prior vaginal yeast infections  Answer Assessment - Initial Assessment Questions Gets a yeast infection anytime she takes an antibiotic. Her ear/sinus infection is doing much better. Symptoms of burning, itching, and will get raw and painful but hasn't got there quite yet. She denies fever, painful urination, or abdominal pain.  She meant to ask for Diflucan  when prescribed the abx but forgot. Pharmacy confirmed. Aware of ED/UC call back precautions and understands.   1. SYMPTOM: What's the main symptom you're concerned about? (e.g., pain, itching, dryness)     Burning and itching  2. LOCATION: Where is the  yeast infection located? (e.g., inside/outside, left/right)     Consistent with Normal yeast infection for her 3. ONSET: When did the  yeast infection  start?     A few days ago  4. PAIN: Is there any pain? If Yes, ask: How bad is it? (Scale: 1-10; mild, moderate, severe)     Gets raw and painful but hasn't got there yet 5. ITCHING: Is there any itching? If Yes, ask: How bad is  it? (Scale: 1-10; mild, moderate, severe)     mild 6. CAUSE: What do you think is causing the discharge? Have you had the same problem before? What happened then?     Usually gets with antibiotics 7. OTHER SYMPTOMS: Do you have any other symptoms? (e.g., fever, itching, vaginal bleeding, pain with urination, injury to genital area, vaginal foreign body)     Denies  Protocols used: Vaginal Symptoms-A-AH

## 2024-09-20 ENCOUNTER — Telehealth: Payer: Self-pay

## 2024-09-23 ENCOUNTER — Ambulatory Visit: Admitting: Urology

## 2024-11-14 ENCOUNTER — Encounter: Payer: Self-pay | Admitting: Family Medicine

## 2024-11-19 ENCOUNTER — Ambulatory Visit: Payer: Self-pay

## 2024-11-27 ENCOUNTER — Other Ambulatory Visit: Payer: Self-pay | Admitting: Family Medicine

## 2024-12-24 ENCOUNTER — Encounter: Admitting: Family Medicine

## 2025-01-06 ENCOUNTER — Ambulatory Visit: Admitting: Urology

## 2025-01-13 ENCOUNTER — Ambulatory Visit: Admitting: Urology

## 2025-03-04 ENCOUNTER — Ambulatory Visit
